# Patient Record
Sex: Female | Born: 1940 | Race: White | Hispanic: No | Marital: Married | Smoking: Former smoker
Health system: Southern US, Community
[De-identification: ages and names within clinical notes are randomized; demographics above are authoritative.]

## PROBLEM LIST (undated history)

## (undated) DIAGNOSIS — R7989 Other specified abnormal findings of blood chemistry: Principal | ICD-10-CM

## (undated) DIAGNOSIS — N289 Disorder of kidney and ureter, unspecified: Secondary | ICD-10-CM

## (undated) DIAGNOSIS — R600 Localized edema: Secondary | ICD-10-CM

## (undated) DIAGNOSIS — E785 Hyperlipidemia, unspecified: Secondary | ICD-10-CM

## (undated) DIAGNOSIS — R0981 Nasal congestion: Secondary | ICD-10-CM

## (undated) DIAGNOSIS — D61818 Other pancytopenia: Secondary | ICD-10-CM

## (undated) DIAGNOSIS — D649 Anemia, unspecified: Secondary | ICD-10-CM

## (undated) DIAGNOSIS — R011 Cardiac murmur, unspecified: Secondary | ICD-10-CM

## (undated) DIAGNOSIS — M25469 Effusion, unspecified knee: Secondary | ICD-10-CM

## (undated) DIAGNOSIS — M169 Osteoarthritis of hip, unspecified: Secondary | ICD-10-CM

## (undated) DIAGNOSIS — K819 Cholecystitis, unspecified: Principal | ICD-10-CM

## (undated) DIAGNOSIS — M1990 Unspecified osteoarthritis, unspecified site: Secondary | ICD-10-CM

## (undated) DIAGNOSIS — E039 Hypothyroidism, unspecified: Secondary | ICD-10-CM

## (undated) DIAGNOSIS — C91Z0 Other lymphoid leukemia not having achieved remission: Principal | ICD-10-CM

## (undated) DIAGNOSIS — Z22322 Carrier or suspected carrier of Methicillin resistant Staphylococcus aureus: Secondary | ICD-10-CM

## (undated) DIAGNOSIS — R634 Abnormal weight loss: Secondary | ICD-10-CM

## (undated) DIAGNOSIS — R05 Cough: Secondary | ICD-10-CM

## (undated) DIAGNOSIS — I8390 Asymptomatic varicose veins of unspecified lower extremity: Secondary | ICD-10-CM

## (undated) DIAGNOSIS — R51 Headache: Secondary | ICD-10-CM

## (undated) DIAGNOSIS — M5030 Other cervical disc degeneration, unspecified cervical region: Secondary | ICD-10-CM

## (undated) DIAGNOSIS — C55 Malignant neoplasm of uterus, part unspecified: Secondary | ICD-10-CM

## (undated) DIAGNOSIS — I00 Rheumatic fever without heart involvement: Secondary | ICD-10-CM

## (undated) DIAGNOSIS — M1710 Unilateral primary osteoarthritis, unspecified knee: Secondary | ICD-10-CM

## (undated) DIAGNOSIS — N179 Acute kidney failure, unspecified: Secondary | ICD-10-CM

## (undated) DIAGNOSIS — R1013 Epigastric pain: Principal | ICD-10-CM

## (undated) DIAGNOSIS — F419 Anxiety disorder, unspecified: Secondary | ICD-10-CM

## (undated) DIAGNOSIS — I10 Essential (primary) hypertension: Secondary | ICD-10-CM

## (undated) DIAGNOSIS — D72820 Lymphocytosis (symptomatic): Principal | ICD-10-CM

## (undated) DIAGNOSIS — R059 Cough, unspecified: Secondary | ICD-10-CM

## (undated) DIAGNOSIS — R110 Nausea: Secondary | ICD-10-CM

## (undated) DIAGNOSIS — R945 Abnormal results of liver function studies: Secondary | ICD-10-CM

## (undated) DIAGNOSIS — M179 Osteoarthritis of knee, unspecified: Secondary | ICD-10-CM

## (undated) HISTORY — DX: Hypothyroidism, unspecified: E03.9

## (undated) HISTORY — DX: Other pancytopenia: D61.818

## (undated) HISTORY — DX: Hyperlipidemia, unspecified: E78.5

## (undated) HISTORY — DX: Abnormal results of liver function studies: R94.5

## (undated) HISTORY — DX: Osteoarthritis of hip, unspecified: M16.9

## (undated) HISTORY — DX: Other specified abnormal findings of blood chemistry: R79.89

## (undated) HISTORY — DX: Anemia, unspecified: D64.9

## (undated) HISTORY — DX: Essential (primary) hypertension: I10

## (undated) HISTORY — DX: Effusion, unspecified knee: M25.469

## (undated) HISTORY — DX: Abnormal weight loss: R63.4

## (undated) HISTORY — DX: Unspecified osteoarthritis, unspecified site: M19.90

## (undated) HISTORY — PX: TOOTH EXTRACTION: SUR596

## (undated) HISTORY — DX: Asymptomatic varicose veins of unspecified lower extremity: I83.90

## (undated) HISTORY — DX: Osteoarthritis of knee, unspecified: M17.9

## (undated) HISTORY — DX: Nasal congestion: R09.81

## (undated) HISTORY — DX: Rheumatic fever without heart involvement: I00

## (undated) HISTORY — PX: GASTRIC ROUX-EN-Y: SHX5262

## (undated) HISTORY — PX: OTHER SURGICAL HISTORY: SHX169

## (undated) HISTORY — DX: Carrier or suspected carrier of methicillin resistant Staphylococcus aureus: Z22.322

## (undated) HISTORY — DX: Malignant neoplasm of uterus, part unspecified: C55

## (undated) HISTORY — DX: Cholecystitis, unspecified: K81.9

## (undated) HISTORY — DX: Morbid (severe) obesity due to excess calories: E66.01

## (undated) HISTORY — DX: Epigastric pain: R10.13

## (undated) HISTORY — DX: Unilateral primary osteoarthritis, unspecified knee: M17.10

## (undated) HISTORY — PX: NO PAST SURGERIES: SHX2092

## (undated) HISTORY — DX: Other lymphoid leukemia not having achieved remission: C91.Z0

## (undated) HISTORY — DX: Localized edema: R60.0

## (undated) HISTORY — PX: ULNAR NERVE REPAIR: SHX2594

## (undated) HISTORY — DX: Other cervical disc degeneration, unspecified cervical region: M50.30

## (undated) HISTORY — DX: Acute kidney failure, unspecified: N17.9

## (undated) HISTORY — DX: Nausea: R11.0

## (undated) HISTORY — DX: Disorder of kidney and ureter, unspecified: N28.9

## (undated) HISTORY — DX: Lymphocytosis (symptomatic): D72.820

## (undated) HISTORY — PX: JOINT REPLACEMENT: SHX530

## (undated) HISTORY — DX: Headache: R51

---

## 1964-05-24 HISTORY — PX: TONSILLECTOMY: SUR1361

## 1994-05-24 DIAGNOSIS — C55 Malignant neoplasm of uterus, part unspecified: Secondary | ICD-10-CM

## 1994-05-24 HISTORY — DX: Malignant neoplasm of uterus, part unspecified: C55

## 1994-05-24 HISTORY — PX: ABDOMINAL HYSTERECTOMY: SUR658

## 1995-05-25 HISTORY — PX: OTHER SURGICAL HISTORY: SHX169

## 2003-05-25 HISTORY — PX: OTHER SURGICAL HISTORY: SHX169

## 2004-05-24 HISTORY — PX: OTHER SURGICAL HISTORY: SHX169

## 2008-01-01 ENCOUNTER — Emergency Department: Payer: Self-pay | Admitting: Emergency Medicine

## 2008-05-02 ENCOUNTER — Ambulatory Visit: Payer: Self-pay | Admitting: Internal Medicine

## 2009-03-11 ENCOUNTER — Ambulatory Visit: Payer: Self-pay | Admitting: Unknown Physician Specialty

## 2009-05-05 ENCOUNTER — Ambulatory Visit: Payer: Self-pay | Admitting: Internal Medicine

## 2009-08-25 ENCOUNTER — Encounter: Admission: RE | Admit: 2009-08-25 | Discharge: 2009-08-25 | Payer: Self-pay | Admitting: *Deleted

## 2009-09-21 HISTORY — PX: OTHER SURGICAL HISTORY: SHX169

## 2009-10-01 ENCOUNTER — Ambulatory Visit: Payer: Self-pay | Admitting: Pulmonary Disease

## 2009-10-01 ENCOUNTER — Inpatient Hospital Stay (HOSPITAL_COMMUNITY): Admission: RE | Admit: 2009-10-01 | Discharge: 2009-10-05 | Payer: Self-pay | Admitting: *Deleted

## 2009-10-01 ENCOUNTER — Ambulatory Visit: Payer: Self-pay | Admitting: Cardiovascular Disease

## 2009-10-03 ENCOUNTER — Encounter: Payer: Self-pay | Admitting: Cardiovascular Disease

## 2009-10-03 ENCOUNTER — Encounter (INDEPENDENT_AMBULATORY_CARE_PROVIDER_SITE_OTHER): Payer: Self-pay | Admitting: Pulmonary Disease

## 2009-10-03 LAB — CONVERTED CEMR LAB
Creatinine, Ser: 26.1 mg/dL
Magnesium: 1.8 mg/dL
Potassium: 204 meq/L
Potassium: 3.2 meq/L

## 2009-10-17 ENCOUNTER — Ambulatory Visit: Payer: Self-pay | Admitting: Cardiovascular Disease

## 2009-10-17 DIAGNOSIS — R609 Edema, unspecified: Secondary | ICD-10-CM | POA: Insufficient documentation

## 2009-10-17 DIAGNOSIS — R0989 Other specified symptoms and signs involving the circulatory and respiratory systems: Secondary | ICD-10-CM | POA: Insufficient documentation

## 2009-10-20 DIAGNOSIS — I959 Hypotension, unspecified: Secondary | ICD-10-CM

## 2010-06-04 ENCOUNTER — Ambulatory Visit: Payer: Self-pay | Admitting: Internal Medicine

## 2010-06-09 ENCOUNTER — Ambulatory Visit: Payer: Self-pay | Admitting: Orthopedic Surgery

## 2010-06-23 NOTE — Assessment & Plan Note (Signed)
Summary: NP6/AMD   Primary Provider:  Letta Pate. Walker  CC:  Consult;episodes of tachycardia.  History of Present Illness: Stacey Cooley is a 70 year old woman with a history of cervical DJD, morbid obesity, hyperlipidemia, chronic lower extremity swelling, bilateral hip and knee DJD D. with replacement done in some of the joints, presenting for cardiac evaluation after a recent hospital admission for cervical decompression and fusion of multiple cervical vertebrae. In the postoperative period, she developed tachycardia, possibly consistent with SVT. She was given Cardizem which improved her rhythm appeared the second and third day postoperatively she also had short runs of a narrow complex rhythm that resolved without intervention.  She presents today for new patient evaluation and for evaluation of her tachycardia. Since her discharge, she has felt well. she denies any lightheadedness, dizziness, or operations. She was discharged on metoprolol tartrate 25 mg b.i.d. She was kept on her benazepril. Her lower extremity edema is essentially unchanged on her diuretic.   Echocardiogram05/13/2011  Left ventricle: The cavity size was normal. Wall thickness was     normal. Systolic function was normal. The estimated ejection fraction was in the range of 55% to 60%.   - Aortic valve: Valve area: 2.12cm^2(VTI).   - Pulmonary arteries: PA peak pressure: 54mm Hg (S).  Problems Prior to Update: None  Medications Prior to Update: 1)  Metoprolol Tartrate 50 Mg Tabs (Metoprolol Tartrate) .... Take 1 and A Half Tablet By Mouth Two Times A Day  Current Medications (verified): 1)  Metoprolol Tartrate 50 Mg Tabs (Metoprolol Tartrate) .... Take 1 and A Half Tablet By Mouth Two Times A Day  Allergies: 1)  ! Penicillin 2)  ! Sulfa 3)  ! Adhesive Tape  Past History:  Past Medical History: Last updated: 10/17/2009   1. Hypertension.   2. Morbid obesity.   3. Cervical degenerative disk disease.   4.  History of rheumatic fever as a child.   5. Dyslipidemia.   6. Recurrent lower extremity leg swelling noted by patient.   7. 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  Family History: Last updated: 10/17/2009 No significant history of coronary artery disease   noted.  No cancer history.   Social History: Last updated: 10/17/2009 No smoking, no alcohol, no drugs.  Lives with her   husband.   Review of Systems  The patient denies fever, weight loss, weight gain, vision loss, decreased hearing, hoarseness, chest pain, syncope, dyspnea on exertion, peripheral edema, prolonged cough, abdominal pain, incontinence, muscle weakness, depression, and enlarged lymph nodes.    Vital Signs:  Patient profile:   70 year old female Height:      62 inches Weight:      284 pounds BMI:     52.13 Pulse rate:   84 / minute Pulse rhythm:   regular BP sitting:   88 / 42  (right arm) Cuff size:   regular  Vitals Entered By: Stanton Kidney, EMT-P (Oct 17, 2009 2:19 PM)  Physical Exam  General:  Well developed, well nourished, in no acute distress. obese Head:  normocephalic and atraumatic Neck:  Neck supple, no JVD. No masses, thyromegaly or abnormal cervical nodes. Lungs:  Clear bilaterally to auscultation and percussion.  Heart:  Non-displaced PMI, chest non-tender; regular rate and rhythm, S1, S2 without murmurs, rubs or gallops. Carotid upstroke normal, no bruit. . Pedals normal pulses. 1 to 2+ edema of the LE to below the knees, no varicosities. Abdomen:  Bowel sounds positive; abdomen soft and non-tender without masses, organomegaly, or hernias noted. No hepatosplenomegaly.Obese Msk:  Back normal, normal gait. Muscle strength and tone  normal. Pulses:  pulses normal in all 4 extremities Extremities:  No clubbing or cyanosis. Neurologic:  Alert and oriented x 3. Skin:  Intact without lesions or rashes. Psych:  Normal affect.    EKG  Procedure date:  10/04/2009  Findings:      normal sinus rhythm with rate 73 beats per minute, no significant ST or T wave changes.  Impression & Recommendations:  Problem # 1:  TACHYCARDIA (ICD-785) tachycardia was noted in the perioperative and postoperative period. The details are unavailable to [place] as per consult note done in the hospital, the telemetry strips were not saved. Notes indicate a regular narrow complex rhythm concerning for SVT. She denies any significant symptoms since that time with no lightheadedness dizziness or shortness of breath.  We'll continue her on metoprolol tartrate 25 mg b.i.d. We will hold off on further monitors she has additional symptoms.  Problem # 2:  HYPOTENSION, UNSPECIFIED (ICD-458.9) her blood pressure is low today and on recheck it continues to be in the 90s systolic. We will hold her benazepril and have asked her to monitor her blood pressure at home.  Problem # 3:  EDEMA (ICD-782.3) etiology of her edema is likely due to some component of pulmonary hypertension and venous insufficiency. elevated right ventricular systolic pressures were noted on her echocardiogram.  We'll continue her on the Lasix and have suggested that she minimize her fluid intake. I recommended that she wear TED hose when her legs are down.  Problem # 4:  PREVENTIVE HEALTH CARE (ICD-V70.0) she is a nonsmoker, we have recommended that she start aspirin 81 mg daily. we will obtain a copy of her most recent cholesterol.  Patient Instructions: 1)  Your physician recommends that you schedule a follow-up appointment as needed Prescriptions: METOPROLOL TARTRATE 25 MG TABS (METOPROLOL TARTRATE) Take one tablet by mouth twice a day  #180 x 4   Entered by:   Hardin Negus,  RMA   Authorized by:   Dossie Arbour MD   Signed by:   Hardin Negus, RMA on 10/17/2009   Method used:   Electronically to        [facility]. [address] [phone]* (retail)       [address] [place], [place]  [id number]       Ph: [phone]       Fax: [phone]   RxID:   [id number]

## 2010-06-23 NOTE — Letter (Signed)
Summary: PHI  PHI   Imported By: Harlon Flor 10/21/2009 16:36:04  _____________________________________________________________________  External Attachment:    Type:   Image     Comment:   External Document

## 2010-08-10 LAB — CBC
HCT: 25.5 % — ABNORMAL LOW (ref 36.0–46.0)
MCV: 95.6 fL (ref 78.0–100.0)
Platelets: 228 10*3/uL (ref 150–400)

## 2010-08-11 LAB — CBC
HCT: 25.5 % — ABNORMAL LOW (ref 36.0–46.0)
HCT: 30 % — ABNORMAL LOW (ref 36.0–46.0)
Hemoglobin: 10.1 g/dL — ABNORMAL LOW (ref 12.0–15.0)
Hemoglobin: 8.2 g/dL — ABNORMAL LOW (ref 12.0–15.0)
Hemoglobin: 8.5 g/dL — ABNORMAL LOW (ref 12.0–15.0)
Hemoglobin: 9.1 g/dL — ABNORMAL LOW (ref 12.0–15.0)
MCHC: 33.7 g/dL (ref 30.0–36.0)
MCHC: 34 g/dL (ref 30.0–36.0)
MCV: 94.3 fL (ref 78.0–100.0)
MCV: 94.9 fL (ref 78.0–100.0)
MCV: 95.3 fL (ref 78.0–100.0)
Platelets: 190 10*3/uL (ref 150–400)
Platelets: 193 10*3/uL (ref 150–400)
Platelets: 204 10*3/uL (ref 150–400)
RBC: 2.65 MIL/uL — ABNORMAL LOW (ref 3.87–5.11)
RBC: 3.15 MIL/uL — ABNORMAL LOW (ref 3.87–5.11)
RDW: 15 % (ref 11.5–15.5)
RDW: 15.5 % (ref 11.5–15.5)
WBC: 7.5 10*3/uL (ref 4.0–10.5)
WBC: 7.8 10*3/uL (ref 4.0–10.5)
WBC: 8.8 10*3/uL (ref 4.0–10.5)
WBC: 8.8 10*3/uL (ref 4.0–10.5)

## 2010-08-11 LAB — BLOOD GAS, ARTERIAL
Bicarbonate: 21.9 mEq/L (ref 20.0–24.0)
O2 Saturation: 97.3 %
PEEP: 5 cmH2O
Patient temperature: 98.4
RATE: 16 resp/min

## 2010-08-11 LAB — URINALYSIS, ROUTINE W REFLEX MICROSCOPIC
Bilirubin Urine: NEGATIVE
Hgb urine dipstick: NEGATIVE
Protein, ur: NEGATIVE mg/dL
pH: 5 (ref 5.0–8.0)

## 2010-08-11 LAB — DIFFERENTIAL
Basophils Relative: 1 % (ref 0–1)
Eosinophils Absolute: 0.1 10*3/uL (ref 0.0–0.7)
Monocytes Absolute: 0.5 10*3/uL (ref 0.1–1.0)
Monocytes Relative: 6 % (ref 3–12)
Neutro Abs: 4.2 10*3/uL (ref 1.7–7.7)

## 2010-08-11 LAB — BASIC METABOLIC PANEL
BUN: 11 mg/dL (ref 6–23)
BUN: 21 mg/dL (ref 6–23)
BUN: 8 mg/dL (ref 6–23)
CO2: 24 mEq/L (ref 19–32)
Calcium: 8.4 mg/dL (ref 8.4–10.5)
Calcium: 8.5 mg/dL (ref 8.4–10.5)
Calcium: 8.6 mg/dL (ref 8.4–10.5)
Chloride: 108 mEq/L (ref 96–112)
Chloride: 113 mEq/L — ABNORMAL HIGH (ref 96–112)
Creatinine, Ser: 0.6 mg/dL (ref 0.4–1.2)
Creatinine, Ser: 0.67 mg/dL (ref 0.4–1.2)
GFR calc Af Amer: >60 mL/min (ref >60)
GFR calc non Af Amer: >60 mL/min (ref >60)
GFR calc non Af Amer: >60 mL/min (ref >60)
GFR calc non Af Amer: >60 mL/min (ref >60)
GFR calc non Af Amer: >60 mL/min (ref >60)
Glucose, Bld: 132 mg/dL — ABNORMAL HIGH (ref 70–99)
Glucose, Bld: 165 mg/dL — ABNORMAL HIGH (ref 70–99)
Potassium: 3.2 mEq/L — ABNORMAL LOW (ref 3.5–5.1)
Potassium: 4.1 mEq/L (ref 3.5–5.1)
Sodium: 140 mEq/L (ref 135–145)
Sodium: 141 mEq/L (ref 135–145)
Sodium: 144 mEq/L (ref 135–145)

## 2010-08-11 LAB — MRSA PCR SCREENING: MRSA by PCR: NEGATIVE

## 2010-08-11 LAB — TYPE AND SCREEN: ABO/RH(D): O NEG

## 2010-08-11 LAB — MAGNESIUM
Magnesium: 1.8 mg/dL (ref 1.5–2.5)
Magnesium: 1.9 mg/dL (ref 1.5–2.5)

## 2010-08-11 LAB — COMPREHENSIVE METABOLIC PANEL
AST: 32 U/L (ref 0–37)
BUN: 41 mg/dL — ABNORMAL HIGH (ref 6–23)
Chloride: 107 mEq/L (ref 96–112)
Creatinine, Ser: 1.22 mg/dL — ABNORMAL HIGH (ref 0.4–1.2)
GFR calc Af Amer: 53 mL/min — ABNORMAL LOW (ref >60)
Potassium: 4.1 mEq/L (ref 3.5–5.1)
Total Protein: 7.4 g/dL (ref 6.0–8.3)

## 2010-08-11 LAB — PHOSPHORUS
Phosphorus: 1.9 mg/dL — ABNORMAL LOW (ref 2.3–4.6)
Phosphorus: 2.6 mg/dL (ref 2.3–4.6)

## 2010-08-11 LAB — PREALBUMIN: Prealbumin: 13.1 mg/dL — ABNORMAL LOW (ref 18.0–45.0)

## 2010-08-11 LAB — ABO/RH: ABO/RH(D): O NEG

## 2010-08-11 LAB — PROTIME-INR: INR: 1.03 (ref 0.00–1.49)

## 2010-08-24 ENCOUNTER — Ambulatory Visit: Payer: Self-pay | Admitting: Gastroenterology

## 2010-10-15 ENCOUNTER — Inpatient Hospital Stay: Payer: Self-pay | Admitting: Emergency Medicine

## 2010-11-17 ENCOUNTER — Ambulatory Visit: Payer: Self-pay | Admitting: Gastroenterology

## 2010-11-18 ENCOUNTER — Ambulatory Visit: Payer: Self-pay | Admitting: Gastroenterology

## 2010-12-15 ENCOUNTER — Encounter: Payer: Self-pay | Admitting: Cardiovascular Disease

## 2011-06-28 ENCOUNTER — Ambulatory Visit: Payer: Self-pay | Admitting: Internal Medicine

## 2011-06-29 ENCOUNTER — Ambulatory Visit: Payer: Self-pay | Admitting: Internal Medicine

## 2011-09-02 ENCOUNTER — Telehealth: Payer: Self-pay | Admitting: Oncology

## 2011-09-02 NOTE — Telephone Encounter (Signed)
called pt lmovm to rtn call to schedule appt 

## 2011-09-03 ENCOUNTER — Telehealth: Payer: Self-pay | Admitting: Oncology

## 2011-09-03 NOTE — Telephone Encounter (Signed)
called pt scheduled appt for 04/15 @ 3pm.  faxed over a lettter to Dr. Dan Humphreys with appt d/t

## 2011-09-06 ENCOUNTER — Telehealth: Payer: Self-pay | Admitting: Oncology

## 2011-09-06 ENCOUNTER — Ambulatory Visit (HOSPITAL_BASED_OUTPATIENT_CLINIC_OR_DEPARTMENT_OTHER): Payer: PRIVATE HEALTH INSURANCE | Admitting: Lab

## 2011-09-06 ENCOUNTER — Encounter: Payer: Self-pay | Admitting: Oncology

## 2011-09-06 ENCOUNTER — Ambulatory Visit (HOSPITAL_BASED_OUTPATIENT_CLINIC_OR_DEPARTMENT_OTHER): Payer: Medicare Other | Admitting: Oncology

## 2011-09-06 ENCOUNTER — Ambulatory Visit: Payer: Medicare Other

## 2011-09-06 ENCOUNTER — Ambulatory Visit: Payer: Self-pay

## 2011-09-06 VITALS — BP 116/64 | HR 63 | Temp 97.0°F | Ht 62.0 in | Wt 276.2 lb

## 2011-09-06 DIAGNOSIS — E538 Deficiency of other specified B group vitamins: Secondary | ICD-10-CM

## 2011-09-06 DIAGNOSIS — D649 Anemia, unspecified: Secondary | ICD-10-CM

## 2011-09-06 DIAGNOSIS — N289 Disorder of kidney and ureter, unspecified: Secondary | ICD-10-CM

## 2011-09-06 HISTORY — DX: Disorder of kidney and ureter, unspecified: N28.9

## 2011-09-06 LAB — COMPREHENSIVE METABOLIC PANEL
CO2: 24 mEq/L (ref 19–32)
Creatinine, Ser: 1.25 mg/dL — ABNORMAL HIGH (ref 0.50–1.10)
Glucose, Bld: 101 mg/dL — ABNORMAL HIGH (ref 70–99)
Total Bilirubin: 0.2 mg/dL — ABNORMAL LOW (ref 0.3–1.2)

## 2011-09-06 LAB — CBC & DIFF AND RETIC
BASO%: 0.6 % (ref 0.0–2.0)
EOS%: 2.7 % (ref 0.0–7.0)
Immature Retic Fract: 4.7 % (ref 1.60–10.00)
MCH: 30.3 pg (ref 25.1–34.0)
MCHC: 31.8 g/dL (ref 31.5–36.0)
MCV: 95.3 fL (ref 79.5–101.0)
MONO%: 5.6 % (ref 0.0–14.0)
RDW: 15.6 % — ABNORMAL HIGH (ref 11.2–14.5)
Retic Ct Abs: 52.16 10*3/uL (ref 33.70–90.70)
lymph#: 3.8 10*3/uL — ABNORMAL HIGH (ref 0.9–3.3)

## 2011-09-06 LAB — LACTATE DEHYDROGENASE: LDH: 182 U/L (ref 94–250)

## 2011-09-06 LAB — MORPHOLOGY: RBC Comments: NORMAL

## 2011-09-06 LAB — CHCC SMEAR

## 2011-09-06 NOTE — Progress Notes (Signed)
Stacey Cooley  Telephone:([phone]) [phone] Fax:([phone]) [phone]     INITIAL HEMATOLOGY CONSULTATION    Referral MD:  Dr. Jonny Ruiz B. Dan Humphreys, M.D.  [facility] Cc:  Dr. Jones Broom, M.D.  Reason for Referral: anemia.     HPI: [name] is a 71 year-old [place] woman with HTN, obesity, s/p gastric bypass, hypothyroid, renal insufficiency.  I had the chance to review her extensive medical record.  She needs right ulnar nerve release.  She was evaluated by Dr. Ave Filter who found that she has anemia.  PCP work up showed negative hem occult, normal TSH, normal ferritin and iron levels.  She was thus kindly referred for evaluation.  Mr. Burkholder has had anemia ever since she could remember.  She said that her Hgb was never got down to 9.  She received autologous pRBC transfusion in 1977 and 2011 when she had her hip surgery and neck surgery; respectively.  In the past, she had used liquid iron with slight improvement of her Hgb.  She had GI work up with EGD about 10 years ago which was negative.  She has colonoscopy in 08/2010 and 10/2010 for concern for ischemia bowel.  Last colonoscopy in 10/2010 showed possible inflammatory change; however, biopsy was negative for cancer, inflammatory bowel diease per patient's report.   She presented to the clinic for the first time today with her husband.  She denied any visible source of bleeding.  She is actively trying to lose weight by eating less and more active.  She has mild fatigue; however, she is independent of all activities of daily living.  She has diffuse arthritic pain in her shoulders, wrists, hips, knees, ankles.  She had history of release of her right ulnar nerve in the past; however, it is causing her issues again with numbness in the 4th and 5th right fingers.   Patient denies headache, visual changes, confusion, drenching night sweats, palpable lymph node swelling, mucositis, odynophagia, dysphagia, nausea  vomiting, jaundice, chest pain, palpitation, shortness of breath, dyspnea on exertion, productive cough, gum bleeding, epistaxis, hematemesis, hemoptysis, abdominal pain, abdominal swelling, early satiety, melena, hematochezia, hematuria, skin rash, spontaneous bleeding, heat or cold intolerance, bowel bladder incontinence,  focal motor weakness, depression, suicidal or homocidal ideation, feeling hopelessness.     Past Medical History  Diagnosis Date  . HTN (hypertension)   . Morbid obesity   . DDD (degenerative disc disease), cervical   . Rheumatic fever     child  . Dyslipidemia   . Swelling of joint of lower leg     recurrent lower extremity leg swelling, noted by pt  . 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 Anemia   . Hypothyroid   . Renal insufficiency 09/06/2011  . Varicose vein of leg   . Uterine cancer 1996    s/p hysterectomy; patient reported low stage; did not need adjuvant chemo  or radiation.   :    Past Surgical History  Procedure Date  . Anterior cervical decompression and fusion 09/2009    involving C4-7 levels as well as a posterior cervical fusion prcedure from C3 to T  . Tonsillectomy 1966  . Gastric stapling 12/1984 and 2001  . Abdominal hysterectomy 1996  . Right hip replacement 1997  . Right ulnar nerve decompression 2005  . Right carpal tunnel release  2005  . Total left knee replacement 2006  :   CURRENT MEDS: Current Outpatient Prescriptions  Medication Sig Dispense Refill  . acetaminophen-codeine (TYLENOL #3) 300-30 MG per tablet Take by mouth. Take one tablet bid      . allopurinol (ZYLOPRIM) 100 MG tablet Take 100 mg by mouth 2 (two) times daily.        . benazepril (LOTENSIN) 20 MG tablet Take 20 mg by mouth daily.        . CELEBREX 100 MG capsule Take 100 mg by mouth Daily.      . diazepam (VALIUM) 5 MG tablet Take 5 mg  by mouth every 6 (six) hours as needed.        . ferrous fumarate (FERRO-SEQUELS) 50 MG CR tablet Take 50 mg by mouth daily.        . furosemide (LASIX) 40 MG tablet Take 40 mg by mouth 2 (two) times daily.        Marland Kitchen HYDROcodone-acetaminophen (VICODIN) 5-500 MG per tablet Take 1 tablet by mouth every 6 (six) hours as needed.        Marland Kitchen levothyroxine (SYNTHROID, LEVOTHROID) 75 MCG tablet Take 75 mcg by mouth daily.        . metoprolol tartrate (LOPRESSOR) 25 MG tablet Take 25 mg by mouth 2 (two) times daily.        Marland Kitchen nystatin-triamcinolone (MYCOLOG II) cream As needed      . oxybutynin (DITROPAN) 5 MG tablet Take 5 mg by mouth Daily.      . Potassium Gluconate 550 MG TABS Take 1 tablet by mouth daily.        Marland Kitchen tiZANidine (ZANAFLEX) 2 MG tablet Take 2 mg by mouth Twice daily.          Allergies  Allergen Reactions  . Latex Itching  . Penicillins   . Sulfonamide Derivatives   :  Family History  Problem Relation Age of Onset  . Coronary artery disease Neg Hx   . Cancer Neg Hx   :  History   Social History  . Marital Status: Married    Spouse Name: N/A    Number of Children: N/A  . Years of Education: N/A   Occupational History  . Not on file.   Social History Main Topics  . Smoking status: Unknown If Ever Smoked  . Smokeless tobacco: Not on file   Comment: no smoking  . Alcohol Use: No  . Drug Use: No  . Sexually Active: Not on file   Other Topics Concern  . Not on file   Social History Narrative   Lives with husband.   :  REVIEW OF SYSTEM:  The rest of the 14-point review of sytem was negative.   Exam:  General:  Obese woman in no acute distress.  Eyes:  no scleral icterus.  ENT:  There were no oropharyngeal lesions.  Neck was without thyromegaly.  Lymphatics:  Negative cervical, supraclavicular or axillary adenopathy.  Respiratory: lungs were clear bilaterally without wheezing or crackles.  Cardiovascular:  Regular rate and rhythm, S1/S2, without rub or gallop.   There was a 3/6 systolic murmur best heard in the left upper sternal border without radiation. There was no pedal edema.  GI:  abdomen was soft, flat, nontender, nondistended, without organomegaly.  Muscoloskeletal:  no spinal tenderness of palpation of vertebral spine.  Skin exam was without echymosis, petichae.  Neuro exam was nonfocal.  Patient was able to get on and off exam table  without assistance.  Gait was normal.  Patient was alerted and oriented.  Attention was good.   Language was appropriate.  Mood was normal without depression.  Speech was not pressured.  Thought content was not tangential.    LABS:  Lab Results  Component Value Date   WBC 5.8 10/05/2009   HGB 8.5* 10/05/2009   HCT 25.5* 10/05/2009   PLT 228 10/05/2009   GLUCOSE 101* 09/06/2011   ALT 22 09/06/2011   AST 28 09/06/2011   NA 139 09/06/2011   K 4.0 09/06/2011   CL 105 09/06/2011   CREATININE 1.25* 09/06/2011   BUN 28* 09/06/2011   CO2 24 09/06/2011   INR 1.03 09/29/2009    Blood smear review:   I personally reviewed the patient's peripheral blood smear today.  There was isocytosis.  There was no peripheral blast.  There was no schistocytosis, spherocytosis, target cell, rouleaux formation, tear drop cell.  There was no giant platelets or platelet clumps.      ASSESSMENT AND PLAN:   1.  HTN:  Well controlled on Benazepril and Metoprolol per PCP.   2.  Hypothyroidism:  On Synthroid per PCP.  Last TSH was within normal range.   3.  Gout:  Well controlled on allopurinol, Celebrex per PCP.   4.  Obesity:  S/p gastric bypass in the past.   5.  Renal insufficiency: slight increase of her Cr now compared to past CMET.   I sent for 24 hour urine collection for creatinine clearance and total protein.   6.  Right ulnar release:  Pending anemia work up.   7.  Chronic anemia:  - Impression:  Life long however not as low Hgb as she is now.  Ddx:Thalassemia vs Vit B12 deficiency (from past gastric procedure); chronic kidney  disease.  We need to rule out myeloma (due to worsening renal insufficiency).  MDS or pure red cell aplasia are on the differential due to worsen Hgb from baseline anemia.   - Work up:  I sent for 24 hour urine collection for creatinine clearance.  I sent for SPEP, UPEP, serum light chain to rule out plasma cell dyscrasia process.  I sent for hemoglobin electrophoresis to rule out thalassemia.   I sent for Vit B12 level.  I sent for LDH, haptoglobin, retic to rule out hemolysis but less likely.  I sent out for erythropoetin level to see if there is a component of renal insufficiency or relative dyserythropoesis.  If all these are negative and her Hgb continues to worsen, we may consider diagnostic bone marrow biopsy to rule out MDS or red cell aplasia.   8.  Follow up (assuming negative work up).  [facility] - Follow up with me in about 6 months.    The length of time of the face-to-face encounter was 60 minutes. More than 50% of time was spent counseling and coordination of care.

## 2011-09-06 NOTE — Progress Notes (Signed)
Patient came in today as a new patient with her husband,she said she thought she was suppose to see Dr.Khan,thats is what she was told by her primary care doctor,but i let her talk with Amy Mitchell,and she said that's was fine she would go ahead and see Dr.Ha since she is here.i did explain to her and her husband our financial assistance program,but they said no thanks.

## 2011-09-06 NOTE — Patient Instructions (Addendum)
1.  Diagnosis:  ANEMIA. - Possibility:  Thalassemia (hereditary anemia due to congenital hemoglobin abnormality);  Vit B12 deficiency (from past gastric procedure); chronic kidney disease.  We need to rule out myeloma (a slow growing bone cancer).  If anemia worsens, we may consider diagnostic bone marrow biopsy to rule out other bone marrow pathologies (such as myelodysplastic syndrome MDS).   2.  Follow up: - CBC in 2 and then 4 months. - Follow up with me in about 6 months.

## 2011-09-06 NOTE — Telephone Encounter (Signed)
Referred by Dr. Dan Humphreys Dx- Anemia

## 2011-09-08 ENCOUNTER — Telehealth: Payer: Self-pay | Admitting: Oncology

## 2011-09-08 ENCOUNTER — Ambulatory Visit: Payer: Medicare Other | Admitting: Lab

## 2011-09-08 LAB — HEMOGLOBINOPATHY EVALUATION
Hemoglobin Other: 0 %
Hgb A2 Quant: 3.3 % — ABNORMAL HIGH (ref 2.2–3.2)
Hgb F Quant: 0 % (ref 0.0–2.0)
Hgb S Quant: 0 %

## 2011-09-08 LAB — KAPPA/LAMBDA LIGHT CHAINS
Kappa free light chain: 3.66 mg/dL — ABNORMAL HIGH (ref 0.33–1.94)
Kappa:Lambda Ratio: 1.35 (ref 0.26–1.65)
Lambda Free Lght Chn: 2.71 mg/dL — ABNORMAL HIGH (ref 0.57–2.63)

## 2011-09-08 LAB — PROTEIN ELECTROPHORESIS, SERUM
Alpha-1-Globulin: 5.6 % — ABNORMAL HIGH (ref 2.9–4.9)
Alpha-2-Globulin: 14.5 % — ABNORMAL HIGH (ref 7.1–11.8)
Total Protein, Serum Electrophoresis: 7 g/dL (ref 6.0–8.3)

## 2011-09-08 LAB — VITAMIN B12: Vitamin B-12: 431 pg/mL (ref 211–911)

## 2011-09-08 NOTE — Telephone Encounter (Signed)
Pt came back to day, after getting appt calendar. She wants appt change from 7/1, pt's choice, to May , original order was 11/06/11. I emailed Dr. Gaylyn Rong and will call pt once he send instructions and order on what to do.

## 2011-09-09 ENCOUNTER — Other Ambulatory Visit: Payer: Self-pay | Admitting: Oncology

## 2011-09-10 ENCOUNTER — Other Ambulatory Visit: Payer: Self-pay | Admitting: Oncology

## 2011-09-10 DIAGNOSIS — D649 Anemia, unspecified: Secondary | ICD-10-CM

## 2011-09-10 DIAGNOSIS — N289 Disorder of kidney and ureter, unspecified: Secondary | ICD-10-CM

## 2011-09-10 LAB — UIFE/LIGHT CHAINS/TP QN, 24-HR UR
Alpha 1, Urine: DETECTED — AB
Alpha 2, Urine: DETECTED — AB
Beta, Urine: DETECTED — AB
Free Kappa Lt Chains,Ur: 18.3 mg/dL — ABNORMAL HIGH (ref 0.14–2.42)
Free Kappa/Lambda Ratio: 25.07 ratio — ABNORMAL HIGH (ref 2.04–10.37)
Free Lt Chn Excr Rate: 553.58 mg/d
Total Protein, Urine-Ur/day: 590 mg/d — ABNORMAL HIGH (ref 10–140)

## 2011-09-10 LAB — CREATININE CLEARANCE, URINE, 24 HOUR
Collection Interval-CRCL: 24 hours
Creatinine, Urine: 43 mg/dL
Urine Total Volume-CRCL: 3025 mL

## 2011-09-15 ENCOUNTER — Telehealth: Payer: Self-pay | Admitting: Oncology

## 2011-09-15 NOTE — Telephone Encounter (Signed)
called pt and informed her that we cancelled her lab on 07/01 and added lab for 05/15

## 2011-09-22 ENCOUNTER — Other Ambulatory Visit: Payer: PRIVATE HEALTH INSURANCE | Admitting: Lab

## 2011-09-27 ENCOUNTER — Ambulatory Visit: Payer: Self-pay | Admitting: Internal Medicine

## 2011-10-06 ENCOUNTER — Other Ambulatory Visit (HOSPITAL_BASED_OUTPATIENT_CLINIC_OR_DEPARTMENT_OTHER): Payer: Medicare Other | Admitting: Lab

## 2011-10-06 ENCOUNTER — Telehealth: Payer: Self-pay | Admitting: *Deleted

## 2011-10-06 DIAGNOSIS — D649 Anemia, unspecified: Secondary | ICD-10-CM

## 2011-10-06 DIAGNOSIS — N289 Disorder of kidney and ureter, unspecified: Secondary | ICD-10-CM

## 2011-10-06 LAB — CBC WITH DIFFERENTIAL/PLATELET
BASO%: 1 % (ref 0.0–2.0)
Eosinophils Absolute: 0.2 10*3/uL (ref 0.0–0.5)
LYMPH%: 42.5 % (ref 14.0–49.7)
MCHC: 31.8 g/dL (ref 31.5–36.0)
MONO#: 0.4 10*3/uL (ref 0.1–0.9)
NEUT#: 3.5 10*3/uL (ref 1.5–6.5)
Platelets: 215 10*3/uL (ref 145–400)
RBC: 2.62 10*6/uL — ABNORMAL LOW (ref 3.70–5.45)
RDW: 17.2 % — ABNORMAL HIGH (ref 11.2–14.5)
WBC: 7.3 10*3/uL (ref 3.9–10.3)
lymph#: 3.1 10*3/uL (ref 0.9–3.3)
nRBC: 0 % (ref 0–0)

## 2011-10-06 NOTE — Telephone Encounter (Signed)
Pt called back for her lab results.  Informed her of CBC results, Hgb still low per Dr. Gaylyn Rong.  She says already had surgery on her elbow, does not need any further surgery at this time.  Informed her Dr. Gaylyn Rong says her anemia is likely from chronic kidney disease.  Instructed pt to call us if worsening fatigue,  Any sob.  Go to ED for any chest pains.  Pt reports feels tired and cold, but denies any other symptoms. She verbalized understanding and will call for worsening symptoms otherwise keep appt as scheduled in August.

## 2011-10-06 NOTE — Telephone Encounter (Signed)
Message copied by Wende Mott on Wed Oct 06, 2011  2:38 PM ------      Message from: HA, Raliegh Ip T      Created: Wed Oct 06, 2011  1:10 PM       Please call pt with result of CBC.  Her hgb is still low (due to most likely chronic kidney disease).  Extensive work up when she was here last did not show other causes of anemia.  Will continue observation.  If her Hgb is too low for surgery on her hands; the surgeon can ask [place] to transfuse.              Thanks.

## 2011-10-06 NOTE — Telephone Encounter (Signed)
Left VM for pt to call back regarding her lab results.

## 2011-11-22 ENCOUNTER — Other Ambulatory Visit: Payer: PRIVATE HEALTH INSURANCE | Admitting: Lab

## 2012-01-06 ENCOUNTER — Other Ambulatory Visit (HOSPITAL_BASED_OUTPATIENT_CLINIC_OR_DEPARTMENT_OTHER): Payer: Medicare Other | Admitting: Lab

## 2012-01-06 DIAGNOSIS — E538 Deficiency of other specified B group vitamins: Secondary | ICD-10-CM

## 2012-01-06 DIAGNOSIS — D649 Anemia, unspecified: Secondary | ICD-10-CM

## 2012-01-06 DIAGNOSIS — N289 Disorder of kidney and ureter, unspecified: Secondary | ICD-10-CM

## 2012-01-06 LAB — CBC WITH DIFFERENTIAL/PLATELET
BASO%: 0.6 % (ref 0.0–2.0)
Basophils Absolute: 0 10*3/uL (ref 0.0–0.1)
EOS%: 2 % (ref 0.0–7.0)
HCT: 31.3 % — ABNORMAL LOW (ref 34.8–46.6)
HGB: 10 g/dL — ABNORMAL LOW (ref 11.6–15.9)
MCH: 30.7 pg (ref 25.1–34.0)
MONO#: 0.3 10*3/uL (ref 0.1–0.9)
RDW: 16.1 % — ABNORMAL HIGH (ref 11.2–14.5)
WBC: 4.4 10*3/uL (ref 3.9–10.3)
lymph#: 2.6 10*3/uL (ref 0.9–3.3)

## 2012-01-07 ENCOUNTER — Telehealth: Payer: Self-pay

## 2012-01-07 NOTE — Telephone Encounter (Signed)
Message copied by Kallie Locks on Fri Jan 07, 2012  4:28 PM ------      Message from: Jethro Bolus T      Created: Fri Jan 07, 2012  8:31 AM       Please call pt.  Her Hgb has improved (anemia from chronic kidney disease).  I again recommend watchful observation.  Controlling her BP to ensure no worsening of her chronic renal insufficiency.  Thanks.

## 2012-02-21 ENCOUNTER — Telehealth: Payer: Self-pay | Admitting: Oncology

## 2012-02-21 NOTE — Telephone Encounter (Signed)
pt called in and req to r/s october appt to december as [place] with her [place] etc

## 2012-03-07 ENCOUNTER — Ambulatory Visit: Payer: PRIVATE HEALTH INSURANCE | Admitting: Oncology

## 2012-03-07 ENCOUNTER — Other Ambulatory Visit: Payer: PRIVATE HEALTH INSURANCE

## 2012-04-26 ENCOUNTER — Telehealth: Payer: Self-pay | Admitting: *Deleted

## 2012-04-26 ENCOUNTER — Ambulatory Visit (HOSPITAL_BASED_OUTPATIENT_CLINIC_OR_DEPARTMENT_OTHER): Payer: Medicare Other | Admitting: Oncology

## 2012-04-26 ENCOUNTER — Encounter: Payer: Self-pay | Admitting: Oncology

## 2012-04-26 ENCOUNTER — Other Ambulatory Visit (HOSPITAL_BASED_OUTPATIENT_CLINIC_OR_DEPARTMENT_OTHER): Payer: Medicare Other | Admitting: Lab

## 2012-04-26 ENCOUNTER — Telehealth: Payer: Self-pay | Admitting: Oncology

## 2012-04-26 VITALS — BP 113/64 | HR 65 | Temp 97.6°F | Resp 20 | Ht 62.0 in | Wt 269.7 lb

## 2012-04-26 DIAGNOSIS — D631 Anemia in chronic kidney disease: Secondary | ICD-10-CM

## 2012-04-26 DIAGNOSIS — E538 Deficiency of other specified B group vitamins: Secondary | ICD-10-CM

## 2012-04-26 DIAGNOSIS — D649 Anemia, unspecified: Secondary | ICD-10-CM

## 2012-04-26 DIAGNOSIS — N189 Chronic kidney disease, unspecified: Secondary | ICD-10-CM

## 2012-04-26 DIAGNOSIS — N289 Disorder of kidney and ureter, unspecified: Secondary | ICD-10-CM

## 2012-04-26 LAB — CBC WITH DIFFERENTIAL/PLATELET
Basophils Absolute: 0 10*3/uL (ref 0.0–0.1)
Eosinophils Absolute: 0.1 10*3/uL (ref 0.0–0.5)
HCT: 33.9 % — ABNORMAL LOW (ref 34.8–46.6)
HGB: 11.1 g/dL — ABNORMAL LOW (ref 11.6–15.9)
MCV: 98 fL (ref 79.5–101.0)
NEUT#: 2.8 10*3/uL (ref 1.5–6.5)
NEUT%: 46.1 % (ref 38.4–76.8)
RDW: 15.3 % — ABNORMAL HIGH (ref 11.2–14.5)
lymph#: 2.7 10*3/uL (ref 0.9–3.3)

## 2012-04-26 LAB — COMPREHENSIVE METABOLIC PANEL (CC13)
Albumin: 3.8 g/dL (ref 3.5–5.0)
BUN: 16 mg/dL (ref 7.0–26.0)
CO2: 26 mEq/L (ref 22–29)
Calcium: 10.2 mg/dL (ref 8.4–10.4)
Glucose: 82 mg/dl (ref 70–99)
Potassium: 5.3 mEq/L — ABNORMAL HIGH (ref 3.5–5.1)
Sodium: 139 mEq/L (ref 136–145)
Total Protein: 6.8 g/dL (ref 6.4–8.3)

## 2012-04-26 NOTE — Telephone Encounter (Signed)
gv pt appt schedule for March and May 2014.

## 2012-04-26 NOTE — Telephone Encounter (Signed)
Left VM for pt to return nurse's call.  

## 2012-04-26 NOTE — Telephone Encounter (Signed)
Message copied by Wende Mott on Wed Apr 26, 2012 11:57 AM ------      Message from: HA, Raliegh Ip T      Created: Wed Apr 26, 2012 10:35 AM       Please call pt.  Her Hgb is improved from before.  This is most likely anemia of chronic kidney dz.  Continue observation.  Thanks.

## 2012-04-26 NOTE — Telephone Encounter (Signed)
Pt returned call and I informed her of lab results,  Elevated Potassium w/ instructions to stop taking her oral potassium.  Pt states has been taking Potassium twice daily per Dr. Dan Humphreys.  Instructed her to stop and contact Dr. Dan Humphreys for further instructions.  Routed lab to Dr. Dan Humphreys electronically.  Pt verbalized understanding.

## 2012-04-26 NOTE — Progress Notes (Signed)
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 female returns for routine follow-up with her husband. States he is having more fatigue, but has been taking care of her mother with Alzheimer's dementia who lives in [place]. She has diffuse arthritic pain in her shoulders, wrists, hips, knees, ankles. She had history of release of her right ulnar nerve in the past; however, it is causing her issues again with numbness in the 4th and 5th right fingers.   Patient denies headache, visual changes, confusion, drenching night sweats, palpable lymph node swelling, mucositis, odynophagia, dysphagia, nausea vomiting, jaundice, chest pain, palpitation, shortness of breath, dyspnea on exertion, productive cough, gum bleeding, epistaxis, hematemesis, hemoptysis, abdominal pain, abdominal swelling, early satiety, melena, hematochezia, hematuria, skin rash, spontaneous bleeding, heat or cold intolerance, bowel bladder incontinence, focal motor weakness, depression, suicidal or homocidal ideation, feeling hopelessness.   Past Medical History  Diagnosis Date  . HTN (hypertension)   . Morbid obesity   . DDD (degenerative disc disease), cervical   . Rheumatic fever     child  . Dyslipidemia   . Swelling of joint of lower leg     recurrent lower extremity leg swelling, noted by pt  . 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 Anemia   . Hypothyroid   . Renal insufficiency 09/06/2011  . Varicose vein of leg   . Uterine cancer 1996    s/p hysterectomy; patient reported low stage; did not need adjuvant chemo  or radiation.     Past Surgical History  Procedure Date  . Anterior  cervical decompression and fusion 09/2009    involving C4-7 levels as well as a posterior cervical fusion prcedure from C3 to T  . Tonsillectomy 1966  . Gastric stapling 12/1984 and 2001  . Abdominal hysterectomy 1996  . Right hip replacement 1997  . Right ulnar nerve decompression 2005  . Right carpal tunnel release 2005  . Total left knee replacement 2006    Current Outpatient Prescriptions  Medication Sig Dispense Refill  . aspirin 81 MG tablet Take 81 mg by mouth daily.      . citalopram (CELEXA) 20 MG tablet Take 20 mg by mouth daily.      . clindamycin (CLEOCIN) 150 MG capsule Take 150 mg by mouth. Prior to dental procedures.      . fluticasone (VERAMYST) 27.5 MCG/SPRAY nasal spray Place 2 sprays into the nose daily.      Marland Kitchen acetaminophen-codeine (TYLENOL #3) 300-30 MG per tablet Take by mouth. Take one tablet bid      . allopurinol (ZYLOPRIM) 100 MG tablet Take 300 mg by mouth daily.       . benazepril (LOTENSIN) 20 MG tablet Take 20 mg by mouth daily.        . CELEBREX 100 MG capsule Take 100 mg by mouth Daily.      . ferrous fumarate (FERRO-SEQUELS) 50 MG CR tablet Take 50 mg by mouth daily.        Marland Kitchen levothyroxine (SYNTHROID, LEVOTHROID) 75 MCG tablet Take 75 mcg by mouth daily.        . metoprolol tartrate (LOPRESSOR) 25 MG tablet Take 25  mg by mouth 2 (two) times daily.        Marland Kitchen oxybutynin (DITROPAN) 5 MG tablet Take 5 mg by mouth Daily.      . Potassium Gluconate 550 MG TABS Take 1 tablet by mouth daily.        Marland Kitchen tiZANidine (ZANAFLEX) 2 MG tablet Take 2 mg by mouth 3 (three) times daily.         ALLERGIES:  is allergic to latex; penicillins; and sulfonamide derivatives.  REVIEW OF SYSTEMS:  The rest of the 14-point review of system was negative.   Filed Vitals:   04/26/12 1059  BP: 113/64  Pulse: 65  Temp: 97.6 F (36.4 C)  Resp: 20   Wt Readings from Last 3 Encounters:  04/26/12 269 lb 11.2 oz (122.335 kg)  09/06/11 276 lb 3.2 oz (125.283 kg)  10/17/09 284 lb  (128.822 kg)   ECOG Performance status: 1  PHYSICAL EXAMINATION:   General: Obese woman in no acute distress. Eyes: no scleral icterus. ENT: There were no oropharyngeal lesions. Neck was without thyromegaly. Lymphatics: Negative cervical, supraclavicular or axillary adenopathy. Respiratory: lungs were clear bilaterally without wheezing or crackles. Cardiovascular: Regular rate and rhythm, S1/S2, without rub or gallop. There was a 3/6 systolic murmur best heard in the left upper sternal border without radiation. There was no pedal edema. GI: abdomen was soft, flat, nontender, nondistended, without organomegaly. Muscoloskeletal: no spinal tenderness of palpation of vertebral spine. Skin exam was without echymosis, petichae. Neuro exam was nonfocal. Patient was able to get on and off exam table without assistance. Gait was normal. Patient was alerted and oriented. Attention was good. Language was appropriate. Mood was normal without depression. Speech was not pressured. Thought content was not tangential.    LABORATORY/RADIOLOGY DATA:  Lab Results  Component Value Date   WBC 6.0 04/26/2012   HGB 11.1* 04/26/2012   HCT 33.9* 04/26/2012   PLT 215 04/26/2012   GLUCOSE 82 04/26/2012   ALKPHOS 121 04/26/2012   ALT 30 04/26/2012   AST 39* 04/26/2012   NA 139 04/26/2012   K 5.3* 04/26/2012   CL 102 04/26/2012   CREATININE 1.0 04/26/2012   BUN 16.0 04/26/2012   CO2 26 04/26/2012   INR 1.03 09/29/2009    ASSESSMENT AND PLAN:   1. HTN: Well controlled on Benazepril and Metoprolol per PCP.   2. Hypothyroidism: On Synthroid per PCP. Last TSH was within normal range.   3. Gout: Well controlled on allopurinol, Celebrex per PCP.   4. Obesity: S/p gastric bypass in the past.   5. Renal insufficiency: Creatinine has now normalized off Lasix.  6. Chronic anemia: Due to renal insufficiency. Anemia is improving. Recommend continued observation. In the future, is her Hgb continues to worsen, we may consider  diagnostic bone marrow biopsy to rule out MDS or red cell aplasia.   7. Follow up: CBC here at the [facility] in 3 months. Visit in 6 months.    The length of time of the face-to-face encounter was 15 minutes. More than 50% of time was spent counseling and coordination of care.

## 2012-04-30 ENCOUNTER — Emergency Department: Payer: Self-pay | Admitting: Emergency Medicine

## 2012-04-30 ENCOUNTER — Inpatient Hospital Stay (HOSPITAL_COMMUNITY)
Admission: EM | Admit: 2012-04-30 | Discharge: 2012-05-08 | DRG: 490 | Disposition: A | Discharge status: Skilled Nursing Facility | Payer: Medicare Other | Attending: Orthopedic Surgery | Admitting: Orthopedic Surgery

## 2012-04-30 ENCOUNTER — Encounter (HOSPITAL_COMMUNITY): Payer: Self-pay | Admitting: Emergency Medicine

## 2012-04-30 DIAGNOSIS — E039 Hypothyroidism, unspecified: Secondary | ICD-10-CM | POA: Diagnosis present

## 2012-04-30 DIAGNOSIS — R110 Nausea: Secondary | ICD-10-CM | POA: Diagnosis present

## 2012-04-30 DIAGNOSIS — Z8614 Personal history of Methicillin resistant Staphylococcus aureus infection: Secondary | ICD-10-CM

## 2012-04-30 DIAGNOSIS — S129XXA Fracture of neck, unspecified, initial encounter: Secondary | ICD-10-CM

## 2012-04-30 DIAGNOSIS — F329 Major depressive disorder, single episode, unspecified: Secondary | ICD-10-CM | POA: Diagnosis present

## 2012-04-30 DIAGNOSIS — S12100A Unspecified displaced fracture of second cervical vertebra, initial encounter for closed fracture: Secondary | ICD-10-CM | POA: Diagnosis present

## 2012-04-30 DIAGNOSIS — R609 Edema, unspecified: Secondary | ICD-10-CM

## 2012-04-30 DIAGNOSIS — Z79899 Other long term (current) drug therapy: Secondary | ICD-10-CM

## 2012-04-30 DIAGNOSIS — F3289 Other specified depressive episodes: Secondary | ICD-10-CM | POA: Diagnosis present

## 2012-04-30 DIAGNOSIS — N179 Acute kidney failure, unspecified: Secondary | ICD-10-CM | POA: Diagnosis present

## 2012-04-30 DIAGNOSIS — S12000A Unspecified displaced fracture of first cervical vertebra, initial encounter for closed fracture: Principal | ICD-10-CM | POA: Diagnosis present

## 2012-04-30 DIAGNOSIS — Z96659 Presence of unspecified artificial knee joint: Secondary | ICD-10-CM

## 2012-04-30 DIAGNOSIS — N289 Disorder of kidney and ureter, unspecified: Secondary | ICD-10-CM

## 2012-04-30 DIAGNOSIS — R296 Repeated falls: Secondary | ICD-10-CM | POA: Diagnosis present

## 2012-04-30 DIAGNOSIS — F1010 Alcohol abuse, uncomplicated: Secondary | ICD-10-CM

## 2012-04-30 DIAGNOSIS — Z6841 Body Mass Index (BMI) 40.0 and over, adult: Secondary | ICD-10-CM

## 2012-04-30 DIAGNOSIS — E669 Obesity, unspecified: Secondary | ICD-10-CM | POA: Diagnosis present

## 2012-04-30 DIAGNOSIS — D649 Anemia, unspecified: Secondary | ICD-10-CM

## 2012-04-30 DIAGNOSIS — E538 Deficiency of other specified B group vitamins: Secondary | ICD-10-CM

## 2012-04-30 DIAGNOSIS — I959 Hypotension, unspecified: Secondary | ICD-10-CM

## 2012-04-30 DIAGNOSIS — E875 Hyperkalemia: Secondary | ICD-10-CM | POA: Diagnosis present

## 2012-04-30 LAB — CBC
HCT: 38.8 % (ref 35.0–47.0)
MCH: 32.7 pg (ref 26.0–34.0)
MCHC: 32.6 g/dL (ref 32.0–36.0)
MCV: 100 fL (ref 80–100)
Platelet: 231 10*3/uL (ref 150–440)
RBC: 3.88 10*6/uL (ref 3.80–5.20)
RDW: 16 % — ABNORMAL HIGH (ref 11.5–14.5)

## 2012-04-30 LAB — URINALYSIS, COMPLETE
Glucose,UR: NEGATIVE mg/dL (ref 0–75)
Ketone: NEGATIVE
Nitrite: NEGATIVE
Ph: 5 (ref 4.5–8.0)
Protein: 30
Specific Gravity: 1.014 (ref 1.003–1.030)

## 2012-04-30 LAB — ETHANOL
Ethanol %: 0.181 % — ABNORMAL HIGH (ref 0.000–0.080)
Ethanol: 181 mg/dL

## 2012-04-30 LAB — COMPREHENSIVE METABOLIC PANEL
AST: 38 U/L — ABNORMAL HIGH (ref 0–37)
Albumin: 3.7 g/dL (ref 3.4–5.0)
Albumin: 3.2 g/dL — ABNORMAL LOW (ref 3.5–5.2)
Anion Gap: 14 (ref 7–16)
BUN: 31 mg/dL — ABNORMAL HIGH (ref 6–23)
Bilirubin,Total: 0.4 mg/dL (ref 0.2–1.0)
Calcium, Total: 8.2 mg/dL — ABNORMAL LOW (ref 8.5–10.1)
Chloride: 98 mmol/L (ref 98–107)
Chloride: 96 mEq/L (ref 96–112)
Creatinine, Ser: 1.74 mg/dL — ABNORMAL HIGH (ref 0.50–1.10)
Creatinine: 2.2 mg/dL — ABNORMAL HIGH (ref 0.60–1.30)
EGFR (African American): 26 — ABNORMAL LOW
Glucose: 75 mg/dL (ref 65–99)
Osmolality: 273 (ref 275–301)
Potassium: 4.9 mmol/L (ref 3.5–5.1)
SGOT(AST): 44 U/L — ABNORMAL HIGH (ref 15–37)
Total Protein: 7.5 g/dL (ref 6.4–8.2)
Total Protein: 6.3 g/dL (ref 6.0–8.3)

## 2012-04-30 LAB — DRUG SCREEN, URINE
Amphetamines, Ur Screen: NEGATIVE (ref ?–1000)
Benzodiazepine, Ur Scrn: NEGATIVE (ref ?–200)
Cannabinoid 50 Ng, Ur ~~LOC~~: NEGATIVE (ref ?–50)
MDMA (Ecstasy)Ur Screen: NEGATIVE (ref ?–500)
Opiate, Ur Screen: POSITIVE (ref ?–300)
Tricyclic, Ur Screen: NEGATIVE (ref ?–1000)

## 2012-04-30 LAB — LIPASE, BLOOD: Lipase: 40 U/L (ref 11–59)

## 2012-04-30 MED ORDER — ONDANSETRON HCL 4 MG/2ML IJ SOLN: 4.0000 mg | Freq: Four times a day (QID) | INTRAMUSCULAR | Status: DC | PRN

## 2012-04-30 MED ORDER — ONDANSETRON HCL 4 MG PO TABS: 4.0000 mg | ORAL_TABLET | Freq: Four times a day (QID) | ORAL | Status: DC | PRN

## 2012-04-30 MED ORDER — SODIUM CHLORIDE 0.9 % IV BOLUS (SEPSIS)
1000.0000 mL | Freq: Once | INTRAVENOUS | Status: AC
  Administered 2012-04-30: 1000 mL via INTRAVENOUS

## 2012-04-30 MED ORDER — ASPIRIN 81 MG PO CHEW
81.0000 mg | CHEWABLE_TABLET | Freq: Every day | ORAL | Status: DC
  Administered 2012-05-01 – 2012-05-02 (×2): 81 mg via ORAL
  Filled 2012-04-30 (×3): qty 1

## 2012-04-30 MED ORDER — SODIUM CHLORIDE 0.9 % IV BOLUS (SEPSIS)
500.0000 mL | Freq: Once | INTRAVENOUS | Status: AC
  Administered 2012-04-30: 500 mL via INTRAVENOUS

## 2012-04-30 MED ORDER — SODIUM CHLORIDE 0.9 % IV BOLUS (SEPSIS): 1000.0000 mL | Freq: Once | INTRAVENOUS | Status: AC

## 2012-04-30 MED ORDER — SODIUM CHLORIDE 0.9 % IJ SOLN
3.0000 mL | Freq: Two times a day (BID) | INTRAMUSCULAR | Status: DC
  Administered 2012-05-01 – 2012-05-06 (×9): 3 mL via INTRAVENOUS

## 2012-04-30 MED ORDER — FOLIC ACID 1 MG PO TABS
1.0000 mg | ORAL_TABLET | Freq: Every day | ORAL | Status: DC
  Administered 2012-05-01 – 2012-05-08 (×8): 1 mg via ORAL
  Filled 2012-04-30 (×9): qty 1

## 2012-04-30 MED ORDER — FERROUS FUMARATE 50 MG PO TBCR
50.0000 mg | EXTENDED_RELEASE_TABLET | Freq: Every day | ORAL | Status: DC
Start: 2012-04-30 — End: 2012-04-30

## 2012-04-30 MED ORDER — THIAMINE HCL 100 MG/ML IJ SOLN
100.0000 mg | Freq: Every day | INTRAMUSCULAR | Status: DC
  Administered 2012-04-30 – 2012-05-04 (×5): 100 mg via INTRAVENOUS
  Filled 2012-04-30: qty 2
  Filled 2012-04-30 (×4): qty 1

## 2012-04-30 MED ORDER — SODIUM CHLORIDE 0.9 % IV BOLUS (SEPSIS): 1000.0000 mL | Freq: Once | INTRAVENOUS | Status: DC

## 2012-04-30 MED ORDER — FLUTICASONE PROPIONATE 50 MCG/ACT NA SUSP: 2.0000 | Freq: Every day | NASAL | Status: DC | PRN

## 2012-04-30 MED ORDER — HYDROMORPHONE HCL PF 1 MG/ML IJ SOLN
0.5000 mg | INTRAMUSCULAR | Status: DC | PRN
  Administered 2012-05-02: 0.5 mg via INTRAVENOUS
  Filled 2012-04-30 (×2): qty 1

## 2012-04-30 MED ORDER — HYDROCODONE-ACETAMINOPHEN 5-325 MG PO TABS
1.0000 | ORAL_TABLET | ORAL | Status: DC | PRN
  Administered 2012-05-01 – 2012-05-03 (×11): 2 via ORAL
  Administered 2012-05-03: 1 via ORAL
  Administered 2012-05-03 (×2): 2 via ORAL
  Filled 2012-04-30 (×6): qty 2
  Filled 2012-04-30: qty 1
  Filled 2012-04-30 (×7): qty 2

## 2012-04-30 MED ORDER — FERROUS FUMARATE 325 (106 FE) MG PO TABS
1.0000 | ORAL_TABLET | Freq: Every day | ORAL | Status: DC
  Administered 2012-05-01 – 2012-05-07 (×6): 106 mg via ORAL
  Filled 2012-04-30 (×8): qty 1

## 2012-04-30 MED ORDER — OXYBUTYNIN CHLORIDE 5 MG PO TABS
5.0000 mg | ORAL_TABLET | Freq: Two times a day (BID) | ORAL | Status: DC
  Administered 2012-05-01 – 2012-05-08 (×15): 5 mg via ORAL
  Filled 2012-04-30 (×17): qty 1

## 2012-04-30 MED ORDER — CITALOPRAM HYDROBROMIDE 20 MG PO TABS
20.0000 mg | ORAL_TABLET | Freq: Every day | ORAL | Status: DC
  Administered 2012-05-01 – 2012-05-08 (×8): 20 mg via ORAL
  Filled 2012-04-30 (×8): qty 1

## 2012-04-30 MED ORDER — ALLOPURINOL 300 MG PO TABS
300.0000 mg | ORAL_TABLET | Freq: Every day | ORAL | Status: DC
  Administered 2012-05-01 – 2012-05-08 (×8): 300 mg via ORAL
  Filled 2012-04-30 (×8): qty 1

## 2012-04-30 MED ORDER — LEVOTHYROXINE SODIUM 75 MCG PO TABS
75.0000 ug | ORAL_TABLET | Freq: Every day | ORAL | Status: DC
  Administered 2012-05-01 – 2012-05-08 (×8): 75 ug via ORAL
  Filled 2012-04-30 (×9): qty 1

## 2012-04-30 MED ORDER — SODIUM CHLORIDE 0.9 % IV SOLN
INTRAVENOUS | Status: DC
  Administered 2012-05-01: 03:00:00 via INTRAVENOUS

## 2012-04-30 MED ORDER — TIZANIDINE HCL 2 MG PO TABS
2.0000 mg | ORAL_TABLET | Freq: Two times a day (BID) | ORAL | Status: DC
  Administered 2012-05-01 – 2012-05-08 (×15): 2 mg via ORAL
  Filled 2012-04-30 (×17): qty 1

## 2012-04-30 NOTE — Consult Note (Addendum)
[facility] Medical Consultation  Stacey Cooley ZOX:[id number] DOB: 1940/09/14 DOA: 04/30/2012 PCP: Yates Decamp, MD   Requesting physician: Dr. Jodi Geralds Date of consultation: 04/30/2012 Reason for consultation: Hypotension and management of chronic medical problems  Impression/Recommendations  Principal Problem:  *Cervical spine fracture - per primary team - supportive care with analgesia as needed Active Problems:  Hypotension, unspecified - likely multifactorial in etiology and secondary to dehydration, poor oral intake, use of antihypertensives - pt is on Metoprolol, Benazepril, and was also on Lasix but that was discontinued several days prior to this admission due to renal failure - will provide IVF, hold off on Metoprolol and Benazepril - request stepdown unit for now as pt is still hypotensive with IV boluses challenge   Alcohol abuse - actively drinking and transaminitis with AST > ALT suggestive of it as well - will place on CIWA protocol and will monitor for signs of withdrawal but no signs of withdrawal at this point - will also provide MVI, Thiamine and Folate - social work consult for alcohol cessation   Renal insufficiency - this was determined to be secondary to Lasix use and Lasix was discontinued by PCP - renal function has resolved and Cr is within normal limits  Anemia - of chronic disease and with baseline Hg ~10 - continue Iron per home medication regimen - will obtain CBC in AM  Nausea - possibly related to alcohol intoxication, will also check lipase level - keep NPO for now and provide antiemetics as needed  Transaminitis - in the pattern suggestive of alcohol abuse  Hyperkalemia - will repeat CMP  to ensure this is resolved now  Hypothyroidism - will continue Synthroid  Depression  - continue Celexa  I will followup again tomorrow. Please contact me if I can be of assistance in the meanwhile. Thank you for this consultation.  Chief  Complaint: Neck pain, C1-C2 cervical fracture after the fall  HPI:  Pt is 71 yo female with history of uterine cancer and status post hysterectomy, HTN, anemia of chronic disease, chronic kidney disease stage I- II, who presented to [facility] ED after a fall at home, when she tripped over the carpet and has sustained neck fracture. Pt was seen at the [facility] Emergency department today the day of the admission and was found to have sustained cervical C1 - C2 fracture. Pt explains her neck pain is constant and sharp in nature, 7/10 in severity, aggravated by even minimal movement and changes in the position and with no specific alleviating factors. Pain is non radiating and is associated with generalized. Pt does admit to drinking several alcoholic beverages earlier today and has not been eating or drinking other fluids. She denies chest pain, shortness of breath, no fevers, chills, no specific abdominal or urinary concerns. Please note that is rather poor historian overall.  Review of Systems:  Constitutional: Negative for fever, chills, diaphoresis, activity change, appetite change and fatigue.  HENT: Negative for ear pain, nosebleeds, congestion, facial swelling, rhinorrhea, neck pain, neck stiffness and ear discharge.   Eyes: Negative for pain, discharge, redness, itching and visual disturbance.  Respiratory: Negative for cough, choking, chest tightness, shortness of breath, wheezing and stridor.   Cardiovascular: Negative for chest pain, palpitations and leg swelling.  Gastrointestinal: Negative for abdominal distention. Positive for nausea Genitourinary: Negative for dysuria, urgency, frequency, hematuria, flank pain, decreased urine volume, difficulty urinating and dyspareunia.  Musculoskeletal: Negative for back pain, joint swelling, arthralgias and gait problem.  Neurological: Negative for dizziness,  tremors, seizures, syncope, facial asymmetry, speech difficulty, weakness, light-headedness,  numbness and headaches.  Hematological: Negative for adenopathy. Does not bruise/bleed easily.  Psychiatric/Behavioral: Negative for hallucinations, behavioral problems, confusion, dysphoric mood, decreased concentration and agitation.    Past Medical History  Diagnosis Date  . HTN (hypertension)   . Morbid obesity   . DDD (degenerative disc disease), cervical   . Rheumatic fever     child  . Dyslipidemia   . Swelling of joint of lower leg     recurrent lower extremity leg swelling, noted by pt  . 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 Anemia   . Hypothyroid   . Renal insufficiency 09/06/2011  . Varicose vein of leg   . Uterine cancer 1996    s/p hysterectomy; patient reported low stage; did not need adjuvant chemo  or radiation.   Marland Kitchen MRSA (methicillin resistant staph aureus) culture positive    Past Surgical History  Procedure Date  . Anterior cervical decompression and fusion 09/2009    involving C4-7 levels as well as a posterior cervical fusion prcedure from C3 to T  . Tonsillectomy 1966  . Gastric stapling 12/1984 and 2001  . Abdominal hysterectomy 1996  . Right hip replacement 1997  . Right ulnar nerve decompression 2005  . Right carpal tunnel release 2005  . Total left knee replacement 2006   Social History:  reports that she quit smoking about 20 years ago. She does not have any smokeless tobacco history on file. She reports that she does not drink alcohol or use illicit drugs.  Allergies  Allergen Reactions  . Latex Itching  . Penicillins Rash  . Sulfonamide Derivatives Rash   Family History  Problem Relation Age of Onset  . Coronary artery disease Neg Hx   . Cancer Mother     lung cancer  . Liver disease Father   . Anemia Sister     Medication Sig  TYLENOL #3) 300-30 MG per tablet Take 1 tablet by mouth 2 (two) times daily.    allopurinol300 MG tablet Take 300 mg by mouth daily.  aspirin 81 MG tablet Take 81 mg by mouth at bedtime.   benazepril 20 MG tablet Take 20 mg by mouth every morning.   CELEBREX 100 MG capsule Take 100 mg by mouth every morning.   citalopram (CELEXA) 20 MG tablet Take 20 mg by mouth daily.  ferrous fumarate 50 MG CR tablet Take 50 mg by mouth at bedtime.  fluticasone  27.5 MCG nasal spray Place 2 sprays into the nose daily as needed.   levothyroxine  75 MCG tablet Take 75 mcg by mouth daily.    metoprolol tartrate 25 MG tablet Take 25 mg by mouth 2 (two) times daily.    oxybutynin 5 MG tablet Take 5 mg by mouth Daily.  tiZANidine (ZANAFLEX) 2 MG tablet Take 2 mg by mouth 2 (two) times daily.    Physical Exam: Blood pressure 68/30, pulse 88, temperature 98.2 F (36.8 C), resp. rate 18, SpO2 99.00%. Filed Vitals:   04/30/12 1932 04/30/12 2013  BP: 92/34 68/30  Pulse: 88   Temp: 98.2 F (36.8 C)   Resp: 18   SpO2: 99%     Physical Exam  Constitutional: Appears well-developed and well-nourished. No distress.  HENT: Normocephalic. External right and left ear normal. Dry MM Eyes:  Conjunctivae and EOM are normal. PERRLA, no scleral icterus.  Neck: Neck collar in place CVS: RRR, S1/S2 +, no murmurs, no gallops, no carotid bruit.  Pulmonary: Effort and breath sounds normal, no stridor, rhonchi, wheezes, rales.  Abdominal: Soft. BS +,  no distension, tenderness, rebound or guarding.  Musculoskeletal: Normal range of motion. Trace bilateral pitting edema and no tenderness.  Lymphadenopathy: No lymphadenopathy noted, cervical, inguinal. Neuro: Alert. Normal reflexes, muscle tone coordination. No cranial nerve deficit. Skin: Skin is warm and dry. No rash noted. Not diaphoretic. No erythema. No pallor.  Psychiatric: Normal mood and affect. Behavior, judgment, thought content normal.   Labs on Admission:  Basic Metabolic Panel:  Lab 04/26/12 1610  NA 139  K 5.3*  CL 102  CO2 26   GLUCOSE 82  BUN 16.0  CREATININE 1.0  CALCIUM 10.2  MG --  PHOS --   Liver Function Tests:  Lab 04/26/12 1018  AST 39*  ALT 30  ALKPHOS 121  BILITOT 0.45  PROT 6.8  ALBUMIN 3.8   CBC:  Lab 04/26/12 1018  WBC 6.0  NEUTROABS 2.8  HGB 11.1*  HCT 33.9*  MCV 98.0  PLT 215   Radiological Exams on Admission: No results found.  EKG: No EKG obtained, will order one and will follow up on the results  Time spent: 30 minutes  Debbora Presto [facility] Pager [phone]  If 7PM-7AM, please contact night-coverage [email or web address] Password [facility] 04/30/2012, 8:19 PM

## 2012-04-30 NOTE — ED Provider Notes (Addendum)
History     CSN: [id number]  Arrival date & time 04/30/12  7829   First MD Initiated Contact with Patient 04/30/12 1943      Chief Complaint  Patient presents with  . Fall    (Consider location/radiation/quality/duration/timing/severity/associated sxs/prior treatment) Patient is a 71 y.o. female presenting with fall.  Fall   Patient reports that she tripped over carpet in her home 2 days ago injuring her neck. She was  evaluated at [facility] emergency department today determined to have a cervical spine fracture sent here for evaluation by Dr. Luiz Blare who arrived within a few minutes of patient's arrival to the emergency department. She presently complains of neck pain, no other complaint pain is nonradiating. She denies abdominal pain, denies chest pain denies other injury neck pain is worse with movement. Improved with remaining still. She admits to drinking several cocktails earlier today. Denies eating today.  Past Medical History  Diagnosis Date  . HTN (hypertension)   . Morbid obesity   . DDD (degenerative disc disease), cervical   . Rheumatic fever     child  . Dyslipidemia   . Swelling of joint of lower leg     recurrent lower extremity leg swelling, noted by pt  . 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 Anemia   . Hypothyroid   . Renal insufficiency 09/06/2011  . Varicose vein of leg   . Uterine cancer 1996    s/p hysterectomy; patient reported low stage; did not need adjuvant chemo  or radiation.   Marland Kitchen MRSA (methicillin resistant staph aureus) culture positive     Past Surgical History  Procedure Date  . Anterior cervical decompression and fusion 09/2009    involving C4-7 levels as well as a posterior cervical fusion prcedure from C3 to T  . Tonsillectomy 1966  . Gastric stapling 12/1984 and 2001  . Abdominal hysterectomy 1996  . Right  hip replacement 1997  . Right ulnar nerve decompression 2005  . Right carpal tunnel release 2005  . Total left knee replacement 2006    Family History  Problem Relation Age of Onset  . Coronary artery disease Neg Hx   . Cancer Mother     lung cancer  . Liver disease Father   . Anemia Sister     History  Substance Use Topics  . Smoking status: Former Smoker -- 0.2 packs/day for 10 years    Quit date: 05/25/1991  . Smokeless tobacco: Not on file     Comment: no smoking  . Alcohol Use: No    OB History    Grav Para Term Preterm Abortions TAB SAB Ect Mult Living                  Review of Systems  HENT: Positive for neck pain.     Allergies  Latex; Penicillins; and Sulfonamide derivatives  Home Medications   Current Outpatient Rx  Name  Route  Sig  Dispense  Refill  . ACETAMINOPHEN-CODEINE #3 300-30 MG PO TABS   Oral   Take 1 tablet by mouth 2 (two) times daily.          . ALLOPURINOL 300 MG PO TABS   Oral   Take 300 mg by mouth daily.         . ASPIRIN 81 MG PO TABS  Oral   Take 81 mg by mouth at bedtime.          Marland Kitchen BENAZEPRIL HCL 20 MG PO TABS   Oral   Take 20 mg by mouth every morning.          . CELEBREX 100 MG PO CAPS   Oral   Take 100 mg by mouth every morning. May take an additional capsule at night if needed for pain         . CITALOPRAM HYDROBROMIDE 20 MG PO TABS   Oral   Take 20 mg by mouth daily.         Marland Kitchen FERROUS FUMARATE ER 50 MG PO TBCR   Oral   Take 50 mg by mouth at bedtime.         Marland Kitchen FLUTICASONE FUROATE 27.5 MCG/SPRAY NA SUSP   Nasal   Place 2 sprays into the nose daily as needed. For nasal congestion         . LEVOTHYROXINE SODIUM 75 MCG PO TABS   Oral   Take 75 mcg by mouth daily.           Marland Kitchen METOPROLOL TARTRATE 25 MG PO TABS   Oral   Take 25 mg by mouth 2 (two) times daily.           . OXYBUTYNIN CHLORIDE 5 MG PO TABS   Oral   Take 5 mg by mouth Daily.         Marland Kitchen TIZANIDINE HCL 2 MG PO TABS    Oral   Take 2 mg by mouth 2 (two) times daily.            BP 92/34  Pulse 88  Temp 98.2 F (36.8 C)  Resp 18  SpO2 99%  Physical Exam  Nursing note and vitals reviewed. Constitutional: She appears well-developed and well-nourished.  HENT:       Purplish ecchymosis to the forehead  Eyes: Conjunctivae normal are normal. Pupils are equal, round, and reactive to light.  Neck: No tracheal deviation present. No thyromegaly present.       In [place] collar, no point tenderness  Cardiovascular: Normal rate and regular rhythm.   No murmur heard. Pulmonary/Chest: Effort normal and breath sounds normal.  Abdominal: Soft. Bowel sounds are normal. She exhibits no distension. There is no tenderness.       Obese nontender  Musculoskeletal: Normal range of motion. She exhibits no edema and no tenderness.  Neurological: She is alert. Coordination normal.       Glasgow Coma Score 15 moves all extremities well motor strength 5 over 5 overall  Skin: Skin is warm and dry. No rash noted.  Psychiatric: She has a normal mood and affect.    ED Course  Procedures (including critical care time)  Labs Reviewed - No data to display No results found.   No diagnosis found.    MDM  Patient noted to be hypotensive upon arrival. Likely due to dehydration. She exhibits no abdominal tenderness and has no complaint of abdominal pain suffered a ground-level fall 2 days ago. Dr. Luiz Blare to arrange for admission. He requested I call internal medicine for consultation. 8:15 PM I spoke with Dr. Izola Price from the hospitalist service who will act as consultant on patient Diagnosis #1 fall #2 cervical spine fracture        Doug Sou, MD 04/30/12 2015  Doug Sou, MD 04/30/12 2019

## 2012-04-30 NOTE — ED Notes (Signed)
Pt Contact- Terilee Gianopoukos Angelos (Neighbor) : [phone]

## 2012-04-30 NOTE — H&P (Signed)
CC: neck pain  Stacey Cooley is an 71 y.o. female.  HPI: 71 yo female who fell Thursday and had been walking around her house and began having pain today and went to [facility] ER .  She was found to have an Odontoid fracture and a ring of C-! Fracture  She was transferred to [facility] because " we cannot put on a hard collar in the ER at [facility]."  She has had previous fusion C3- C7.  She is c/o pain and completely denies numbness or tingling in upper or lower ext.   Past Medical History  Diagnosis Date  . HTN (hypertension)   . Morbid obesity   . DDD (degenerative disc disease), cervical   . Rheumatic fever     child  . Dyslipidemia   . Swelling of joint of lower leg     recurrent lower extremity leg swelling, noted by pt  . 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 Anemia   . Hypothyroid   . Renal insufficiency 09/06/2011  . Varicose vein of leg   . Uterine cancer 1996    s/p hysterectomy; patient reported low stage; did not need adjuvant chemo  or radiation.   Marland Kitchen MRSA (methicillin resistant staph aureus) culture positive     Past Surgical History  Procedure Date  . Anterior cervical decompression and fusion 09/2009    involving C4-7 levels as well as a posterior cervical fusion prcedure from C3 to T  . Tonsillectomy 1966  . Gastric stapling 12/1984 and 2001  . Abdominal hysterectomy 1996  . Right hip replacement 1997  . Right ulnar nerve decompression 2005  . Right carpal tunnel release 2005  . Total left knee replacement 2006    Family History  Problem Relation Age of Onset  . Coronary artery disease Neg Hx   . Cancer Mother     lung cancer  . Liver disease Father   . Anemia Sister     Social History:  reports that she quit smoking about 20 years ago. She does not have any smokeless tobacco history on file. She reports that she does not drink  alcohol or use illicit drugs.  Allergies:  Allergies  Allergen Reactions  . Latex Itching  . Penicillins Rash  . Sulfonamide Derivatives Rash   Current facility-administered medications:[COMPLETED] sodium chloride 0.9 % bolus 1,000 mL, 1,000 mL, Intravenous, Once, Doug Sou, MD, 1,000 mL at 04/30/12 1938 Current outpatient prescriptions:acetaminophen-codeine (TYLENOL #3) 300-30 MG per tablet, Take 1 tablet by mouth 2 (two) times daily. , Disp: , Rfl: ;  allopurinol (ZYLOPRIM) 300 MG tablet, Take 300 mg by mouth daily., Disp: , Rfl: ;  aspirin 81 MG tablet, Take 81 mg by mouth at bedtime. , Disp: , Rfl: ;  benazepril (LOTENSIN) 20 MG tablet, Take 20 mg by mouth every morning. , Disp: , Rfl:  CELEBREX 100 MG capsule, Take 100 mg by mouth every morning. May take an additional capsule at night if needed for pain, Disp: , Rfl: ;  citalopram (CELEXA) 20 MG tablet, Take 20 mg by mouth daily., Disp: , Rfl: ;  ferrous fumarate (FERRO-SEQUELS) 50 MG CR tablet, Take 50 mg by mouth at bedtime., Disp: , Rfl: ;  fluticasone (VERAMYST) 27.5 MCG/SPRAY nasal spray, Place 2 sprays into the nose daily as needed. For nasal congestion, Disp: , Rfl:  levothyroxine (SYNTHROID, LEVOTHROID) 75 MCG tablet, Take 75 mcg by mouth daily.  , Disp: , Rfl: ;  metoprolol tartrate (LOPRESSOR) 25 MG tablet, Take 25 mg by mouth 2 (two) times daily.  , Disp: , Rfl: ;  oxybutynin (DITROPAN) 5 MG tablet, Take 5 mg by mouth Daily., Disp: , Rfl: ;  tiZANidine (ZANAFLEX) 2 MG tablet, Take 2 mg by mouth 2 (two) times daily. , Disp: , Rfl:  Medications: Prior to Admission:  (Not in a hospital admission)  No results found for this or any previous visit (from the past 48 hour(s)).  No results found.  ROS: I have reviewed the patient's review of systems thoroughly and there are no positive responses as relates to the HPI.  Pt c/o ankle pain chronic EXAM Blood pressure 68/30, pulse 88, temperature 98.2 F (36.8 C), resp. rate 18, SpO2  99.00%. Well-developed well-nourished patient in no acute distress. Alert and oriented x3 HEENT:within normal limits Cardiac: Regular rate and rhythm Pulmonary: Lungs clear to auscultation Abdomen: Soft and nontender.  Normal active bowel sounds  Musculoskeletal: upper ext exam --nl strength,reflexes.and sensation Lower ext-- nl strength,reflexes,and sensation  Labs from  show  GLC75 BUN30 CR2.2 NA134 CL98 TB0.4  UA mult wbc and many bact Urine + for opiates BLOOD ALCOHOL .181  WBC 13.2 HGB 12.7  PLt 231  X-ray CT of head shows brain atrophy but no acute findings CT of neck shows Odontoid fracture and ring of c1 fracture  Assessment/Plan: 71 yo female who is drunk and dehydrated with neck fracture of ring of c-1 and odontoid.  Pt transferred for evaluation and treatment. I will consult medicine to assist with hydration and alcohol issues.  I will place a [place] collar for tonight I spoke with Dr. Yevette Edwards who has previously operated on this patient and he will assume her care in the AM.  Ledell Codrington L 04/30/2012, 8:35 PM

## 2012-04-30 NOTE — ED Notes (Signed)
Patient reports drinking a couple of screw drivers tonight.

## 2012-04-30 NOTE — ED Notes (Signed)
PER EMS- Pt fell 2 days ago. Patients neighbors took pt to the hospital today. Identified C1 and C2 fracture. Pt currently alertx4 NAD at this time. Vitals WNL. BP 97/41 P 82 97% RA.

## 2012-05-01 ENCOUNTER — Encounter (HOSPITAL_COMMUNITY): Payer: Self-pay

## 2012-05-01 ENCOUNTER — Inpatient Hospital Stay (HOSPITAL_COMMUNITY): Payer: Medicare Other

## 2012-05-01 DIAGNOSIS — I959 Hypotension, unspecified: Secondary | ICD-10-CM

## 2012-05-01 DIAGNOSIS — N289 Disorder of kidney and ureter, unspecified: Secondary | ICD-10-CM

## 2012-05-01 LAB — COMPREHENSIVE METABOLIC PANEL
ALT: 21 U/L (ref 0–35)
AST: 31 U/L (ref 0–37)
Alkaline Phosphatase: 95 U/L (ref 39–117)
CO2: 24 mEq/L (ref 19–32)
Chloride: 99 mEq/L (ref 96–112)
GFR calc Af Amer: 47 mL/min — ABNORMAL LOW (ref >90)
GFR calc non Af Amer: 41 mL/min — ABNORMAL LOW (ref >90)
Glucose, Bld: 108 mg/dL — ABNORMAL HIGH (ref 70–99)
Potassium: 4.9 mEq/L (ref 3.5–5.1)
Sodium: 136 mEq/L (ref 135–145)
Total Bilirubin: 0.4 mg/dL (ref 0.3–1.2)

## 2012-05-01 LAB — CBC
Hemoglobin: 10.6 g/dL — ABNORMAL LOW (ref 12.0–15.0)
MCH: 31 pg (ref 26.0–34.0)
MCHC: 32.6 g/dL (ref 30.0–36.0)
RDW: 14.8 % (ref 11.5–15.5)

## 2012-05-01 MED ORDER — CHLORHEXIDINE GLUCONATE 0.12 % MT SOLN
15.0000 mL | Freq: Two times a day (BID) | OROMUCOSAL | Status: DC
  Administered 2012-05-01 – 2012-05-08 (×13): 15 mL via OROMUCOSAL
  Filled 2012-05-01 (×18): qty 15

## 2012-05-01 MED ORDER — METOPROLOL TARTRATE 12.5 MG HALF TABLET
12.5000 mg | ORAL_TABLET | Freq: Two times a day (BID) | ORAL | Status: DC
  Administered 2012-05-01 – 2012-05-08 (×14): 12.5 mg via ORAL
  Filled 2012-05-01 (×18): qty 1

## 2012-05-01 MED ORDER — BIOTENE DRY MOUTH MT LIQD
15.0000 mL | Freq: Two times a day (BID) | OROMUCOSAL | Status: DC
  Administered 2012-05-03 – 2012-05-08 (×10): 15 mL via OROMUCOSAL

## 2012-05-01 MED ORDER — WHITE PETROLATUM GEL
Status: AC
  Filled 2012-05-01: qty 5

## 2012-05-01 MED ORDER — SODIUM POLYSTYRENE SULFONATE 15 GM/60ML PO SUSP
15.0000 g | Freq: Once | ORAL | Status: AC
  Administered 2012-05-01: 15 g via ORAL
  Filled 2012-05-01: qty 60

## 2012-05-01 NOTE — Consult Note (Signed)
CONSULT  Chief Complaint: neck pain  HPI: Stacey Cooley is a 71 y.o. female who was transferred to [place] with neck pain and a CT scan that revealed a C1 and C2 fracture.  Patient is immobilized in hard cervical collar. Patient denies arm or leg pain or weakness. Patient fell around 2-3 pm yesterday and her head hit a table in her home.  She was alone.  Pain is severe and increased with head ROM.     Past Medical History  Diagnosis Date  . HTN (hypertension)   . Morbid obesity   . DDD (degenerative disc disease), cervical   . Rheumatic fever     child  . Dyslipidemia   . Swelling of joint of lower leg     recurrent lower extremity leg swelling, noted by pt  . 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 Anemia   . Hypothyroid   . Renal insufficiency 09/06/2011  . Varicose vein of leg   . Uterine cancer 1996    s/p hysterectomy; patient reported low stage; did not need adjuvant chemo  or radiation.   Marland Kitchen MRSA (methicillin resistant staph aureus) culture positive   . TACHYCARDIA 10/17/2009    Qualifier: Diagnosis of  By: Mariah Milling MD, Tim    . Complication of anesthesia    Past Surgical History  Procedure Date  . Anterior cervical decompression and fusion 09/2009    involving C4-7 levels as well as a posterior cervical fusion prcedure from C3 to T  . Tonsillectomy 1966  . Gastric stapling 12/1984 and 2001  . Abdominal hysterectomy 1996  . Right hip replacement 1997  . Right ulnar nerve decompression 2005  . Right carpal tunnel release 2005  . Total left knee replacement 2006   History   Social History  . Marital Status: Married    Spouse Name: N/A    Number of Children: N/A  . Years of Education: N/A   Social History Main Topics  . Smoking status: Former Smoker -- 0.2 packs/day for 10 years    Quit date: 05/25/1991  . Smokeless tobacco: None   Comment: no smoking  . Alcohol Use: No  . Drug Use: No  . Sexually Active: No   Other Topics Concern  . None   Social History Narrative   Lives with husband.    Family History  Problem Relation Age of Onset  . Coronary artery disease Neg Hx   . Cancer Mother     lung cancer  . Liver disease Father   . Anemia Sister    Allergies  Allergen Reactions  . Latex Itching  . Penicillins Rash  . Sulfonamide Derivatives Rash   Prior to Admission medications   Medication Sig Start Date End Date Taking? Authorizing Provider  acetaminophen-codeine (TYLENOL #3) 300-30 MG per tablet Take 1 tablet by mouth 2 (two) times daily.  07/28/11  Yes Historical Provider, MD  allopurinol (ZYLOPRIM) 300 MG tablet Take 300 mg by mouth daily.   Yes Historical Provider, MD  aspirin 81 MG tablet Take 81 mg by mouth at bedtime.    Yes Historical Provider, MD  benazepril (LOTENSIN) 20 MG tablet Take 20 mg by mouth every morning.    Yes Historical Provider, MD  CELEBREX 100 MG capsule Take 100 mg by mouth every morning. May take an additional capsule at night  if needed for pain 07/16/11  Yes Historical Provider, MD  citalopram (CELEXA) 20 MG tablet Take 20 mg by mouth daily.   Yes Historical Provider, MD  ferrous fumarate (FERRO-SEQUELS) 50 MG CR tablet Take 50 mg by mouth at bedtime.   Yes Historical Provider, MD  fluticasone (VERAMYST) 27.5 MCG/SPRAY nasal spray Place 2 sprays into the nose daily as needed. For nasal congestion   Yes Historical Provider, MD  levothyroxine (SYNTHROID, LEVOTHROID) 75 MCG tablet Take 75 mcg by mouth daily.     Yes Historical Provider, MD  metoprolol tartrate (LOPRESSOR) 25 MG tablet Take 25 mg by mouth 2 (two) times daily.     Yes Historical Provider, MD  oxybutynin (DITROPAN) 5 MG tablet Take 5 mg by mouth Daily. 07/28/11  Yes Historical Provider, MD  tiZANidine (ZANAFLEX) 2 MG tablet Take 2 mg by mouth 2 (two) times daily.  07/17/11  Yes Historical Provider, MD     All other  systems have been reviewed and were otherwise negative with the exception of those mentioned in the HPI and as above.  Physical Exam: Filed Vitals:   05/01/12 0605  BP: 155/63  Pulse: 97  Temp: 98.7 F (37.1 C)  Resp: 16    General: Alert, no acute distress Cardiovascular: No pedal edema Respiratory: No cyanosis, no use of accessory musculature Skin: No lesions in the area of chief complaint Neurologic: 5/5 strength at UEs and LEs.  Sensation in tact. Pain with attempted ROM of neck Psychiatric: Patient is competent for consent with normal mood and affect   CT from yesterday reviewed - notable for nondisplaced type II dens fracture and C12 arch fracture involving anterior and posterior arches - nondisplaced.   Assessment Nondosplaced C1/C2 fractures  PLAN: Patient will need to be immobilized in a hard cervical collar vs. halo vest.  Need to review CT images in additional detail. Will call husband and have him bring patient's hard collar and will obtain additional AP and lateral images of her C spine today.  Will follow-up with patient to discuss definitive treatment.  Patient does understand that these may go on to need surgery if fractures do not heal with immobilization.      Emilee Hero, MD 05/01/2012 8:34 AM

## 2012-05-01 NOTE — Progress Notes (Signed)
PT Treatment Note:  Pt with PT evaluation order this am. Noted MD note to review CT images and decide on treatment. MD d/c PT order at present. Please reorder when pt ready for mobility with PT. Thanks!   05/01/2012 Cephus Shelling, PT, DPT [phone]

## 2012-05-01 NOTE — Progress Notes (Signed)
[facility] Consult follow up Note   Stacey Cooley AVW:[id number] DOB: 1940-09-26 DOA: 04/30/2012 PCP: Yates Decamp, MD  Requesting Physician:  Dr. Jodi Geralds  Date of consultation: 04/30/2012  Reason for consultation: Hypotension and management of chronic medical problems  Assessment/Plan:  Principal Problem:  *Cervical spine fracture  - per primary team   Active Problems:   Hypotension, unspecified  - likely multifactorial in etiology and secondary to dehydration, poor oral intake, use of antihypertensives  - Will restart metoprolol at half home dose. - Continue to hold Benazepril.  If BP and creatinine continue to improve would add back benazepril 12/10. - Continue IVF at 50 ml/hour as she is not eating well.  Alcohol abuse  - actively drinking prior to admission - on CIWA protocol and will monitor for signs of withdrawal but no signs of withdrawal at this point  - will also provide MVI, Thiamine and Folate  - social work consult for alcohol cessation   Acute kidney injury in setting of Renal insufficiency  - renal function has improved and Cr is now close to baseline (1.25) - this was determined to be secondary to Lasix use and Lasix was discontinued by PCP   Anemia  - Stable. - of chronic disease and with baseline Hg ~10.   - continue Iron per home medication regimen   Nausea  - Resolved. - possibly related to alcohol intoxication.   Lipase 40 (wnl) - on clear liquid diet  Hyperkalemia  - Resolved.  Hypothyroidism  - stable. - will continue Synthroid   Depression  - Stable. - continue Celexa   Code Status: full Family Communication: Disposition Plan: Per primary team   Consultants:  [facility]  Procedures:    Antibiotics:    HPI/Subjective: Patient worried about her mother.  Complains of constipation.  Objective: Filed Vitals:   04/30/12 2300 04/30/12 2300 05/01/12 0013 05/01/12 0605  BP: 108/40 94/30 136/53 155/63   Pulse: 96 97 96 97  Temp:   99 F (37.2 C) 98.7 F (37.1 C)  TempSrc:   Oral   Resp: 16 16 16 16   Height:   5\' 2"  (1.575 m)   Weight:   122.018 kg (269 lb)   SpO2: 93% 92% 96% 95%    Intake/Output Summary (Last 24 hours) at 05/01/12 1132 Last data filed at 05/01/12 0906  Gross per 24 hour  Intake      0 ml  Output   1350 ml  Net  -1350 ml   Filed Weights   05/01/12 0013  Weight: 122.018 kg (269 lb)    Exam:   General:  Awake A&O, in firm cervical collar.  Cardiovascular: RRR no M/R/G  Respiratory: No W/C/R no accessory muscle use  Abdomen: Obese, NT, ND, +BS, no masses  Extremities:  Minimal bilateral lower extremity edema, slight erythema in left distal lower extremity.  Data Reviewed: Basic Metabolic Panel:  Lab 05/01/12 8295 04/30/12 2144 04/26/12 1018  NA 136 135 139  K 4.9 5.4* 5.3*  CL 99 96 102  CO2 24 17* 26  GLUCOSE 108* 63* 82  BUN 28* 31* 16.0  CREATININE 1.30* 1.74* 1.0  CALCIUM 8.2* 8.3* 10.2  MG -- -- --  PHOS -- -- --   Liver Function Tests:  Lab 05/01/12 0612 04/30/12 2144 04/26/12 1018  AST 31 38* 39*  ALT 21 23 30   ALKPHOS 95 94 121  BILITOT 0.4 0.3 0.45  PROT 6.2 6.3 6.8  ALBUMIN 3.1* 3.2* 3.8  Lab 04/30/12 2144  LIPASE 40  AMYLASE --   CBC:  Lab 05/01/12 0612 04/26/12 1018  WBC 10.4 6.0  NEUTROABS -- 2.8  HGB 10.6* 11.1*  HCT 32.5* 33.9*  MCV 95.0 98.0  PLT 194 215     Recent Results (from the past 240 hour(s))  SURGICAL PCR SCREEN     Status: Normal   Collection Time   05/01/12  3:07 AM      Component Value Range Status Comment   MRSA, PCR NEGATIVE  NEGATIVE Final    Staphylococcus aureus NEGATIVE  NEGATIVE Final      Studies: Dg Cervical Spine 2-3 Views  05/01/2012  *RADIOLOGY REPORT*  Clinical Data: Followup C1-2 fractures.  CERVICAL SPINE - 2-3 VIEW  Comparison: None.  Findings: Previous anterior cervical disc fusions are seen from levels of C4-C7, and posterior pedicle screws and fixation rods are  seen extending from levels of C3-T1.  Fracture of the base of the odontoid process is seen with mild dorsal displacement and angulation.  Anterior arch of C1 also appears fractured, without evidence of atlantoaxial subluxation.  IMPRESSION:  1.  Odontoid fracture with mild dorsal displacement and angulation. 2.  Fracture of the anterior pressures C1, without evidence of atlantoaxial subluxation.   Original Report Authenticated By: Myles Rosenthal, M.D.     Scheduled Meds:   . allopurinol  300 mg Oral Daily  . antiseptic oral rinse  15 mL Mouth Rinse q12n4p  . aspirin  81 mg Oral QHS  . chlorhexidine  15 mL Mouth Rinse BID  . citalopram  20 mg Oral Daily  . ferrous fumarate  1 tablet Oral QHS  . folic acid  1 mg Oral Daily  . levothyroxine  75 mcg Oral QAC breakfast  . oxybutynin  5 mg Oral BID  . [COMPLETED] sodium chloride  1,000 mL Intravenous Once  . [COMPLETED] sodium chloride  1,000 mL Intravenous Once  . [COMPLETED] sodium chloride  500 mL Intravenous Once  . sodium chloride  3 mL Intravenous Q12H  . [COMPLETED] sodium polystyrene  15 g Oral Once  . thiamine  100 mg Intravenous Daily  . tiZANidine  2 mg Oral BID  . [DISCONTINUED] ferrous fumarate  50 mg Oral QHS  . [DISCONTINUED] sodium chloride  1,000 mL Intravenous Once   Continuous Infusions:   . sodium chloride 75 mL/hr at 05/01/12 1610    Principal Problem:  *Cervical spine fracture Active Problems:  Hypotension, unspecified  Renal insufficiency  Anemia  Alcohol abuse  Nausea  Transaminitis  Hyperkalemia    Time spent: 25 min.    Stephani Police  [facility] Pager [phone] If 8PM-8AM, please contact night-coverage at [email or web address], password [facility] 05/01/2012, 11:32 AM  LOS: 1 day

## 2012-05-01 NOTE — Progress Notes (Signed)
Since this AM, I have additionally reviewed CT and ordered and reviewed cervical films.  Patient is noted to have mild angulation and minimal posterior displacement of dens. Plan is to go forward with a rigid cervicothoracic orthosis to optimally immobilize fracture. Halo vest treatment is another option, but there are certainly risks and morbidity associated with this options as well.  Patient does understand that there does remain a risk of a nonunion, which may require operative intervention in the future. Kathlene November from [facility] has been contacted and will be fitting patient for brace later today. I will subsequently re-evaluate patient to ensure a proper fit and proper immobilization with the brace.

## 2012-05-01 NOTE — Progress Notes (Signed)
Patient seen and examined. Agree with assessment and plan outlined in PAs note.

## 2012-05-01 NOTE — Progress Notes (Signed)
Physical medicine rehabilitation consult as been requested. Await full followup and completion of workup with orthopedic services and physical occupational therapy evaluations. Will provide a full rehabilitation consult once completed

## 2012-05-02 ENCOUNTER — Inpatient Hospital Stay (HOSPITAL_COMMUNITY): Payer: Medicare Other

## 2012-05-02 LAB — CBC
HCT: 32.8 % — ABNORMAL LOW (ref 36.0–46.0)
Hemoglobin: 10.6 g/dL — ABNORMAL LOW (ref 12.0–15.0)
MCH: 31.5 pg (ref 26.0–34.0)
MCHC: 32.3 g/dL (ref 30.0–36.0)
MCV: 97.3 fL (ref 78.0–100.0)

## 2012-05-02 LAB — BASIC METABOLIC PANEL
BUN: 20 mg/dL (ref 6–23)
CO2: 28 mEq/L (ref 19–32)
Chloride: 93 mEq/L — ABNORMAL LOW (ref 96–112)
Glucose, Bld: 121 mg/dL — ABNORMAL HIGH (ref 70–99)
Potassium: 3.8 mEq/L (ref 3.5–5.1)

## 2012-05-02 NOTE — Progress Notes (Signed)
Patient seen and examined. Blood pressure stable. Renal function has resolved. Continue holding her Lasix and ACE inhibitor for now. Medical consult will sign off for now. Please call for any  questions

## 2012-05-02 NOTE — Progress Notes (Signed)
Patient doing well, nausea resolved.  Pain has lessened dramatically.  BP 118/58  Pulse 58  Temp 97.9 F (36.6 C) (Oral)  Resp 16  Ht 5\' 2"  (1.575 m)  Wt 122.018 kg (269 lb)  BMI 49.20 kg/m2  SpO2 100%  Collar appropriately applied Patient comfortable NVI  Pt with C1/C2 fractures - being maintained in hard cervicothoracic orthosis for definitive treatment - will obtain additional lateral view to evaluate dens alignment - up ad lib - patient may transfer to rehab when bed available - additional medical management per medical consult - thoracic extension may be removed when patient is standing to change clothes and to bathe

## 2012-05-02 NOTE — Consult Note (Signed)
Physical Medicine and Rehabilitation Consult Reason for Consult: Odontoid fracture and range of C.-1 fracture Referring Physician: Dr. Yevette Edwards   HPI: Stacey Cooley is a 71 y.o. right-handed female with history of C4-7 anterior cervical decompression and fusion May of 2011. Patient was independent prior to admission living with her husband. Admitted 04/30/2012 with neck pain after a fall when she struck her head and hit a table in her home. The fall was unwitnessed. X-rays and imaging from outside hospital revealed nondisplaced type II dens fracture C1-2. Followup orthopedic services Dr. Yevette Edwards. Patient placed in a rigid cervicothoracic orthosis x3 months. No surgical intervention at this time. Noted blood alcohol level upon admission of 181 and monitored for any signs of alcohol withdrawal. Physical and occupational therapy evaluations are pending. M.D. is requested physical medicine rehabilitation consult to consider inpatient rehabilitation services   Review of Systems  Musculoskeletal: Positive for myalgias and joint pain.  Neurological: Positive for weakness.  Psychiatric/Behavioral: Positive for depression.  All other systems reviewed and are negative.   Past Medical History  Diagnosis Date  . HTN (hypertension)   . Morbid obesity   . DDD (degenerative disc disease), cervical   . Rheumatic fever     child  . Dyslipidemia   . Swelling of joint of lower leg     recurrent lower extremity leg swelling, noted by pt  . 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 Anemia   . Hypothyroid   . Renal insufficiency 09/06/2011  . Varicose vein of leg   . Uterine cancer 1996    s/p hysterectomy; patient reported low stage; did not need adjuvant chemo  or radiation.   Marland Kitchen MRSA (methicillin resistant staph aureus) culture positive   . TACHYCARDIA 10/17/2009    Qualifier:  Diagnosis of  By: Mariah Milling MD, Tim    . Complication of anesthesia    Past Surgical History  Procedure Date  . Anterior cervical decompression and fusion 09/2009    involving C4-7 levels as well as a posterior cervical fusion prcedure from C3 to T  . Tonsillectomy 1966  . Gastric stapling 12/1984 and 2001  . Abdominal hysterectomy 1996  . Right hip replacement 1997  . Right ulnar nerve decompression 2005  . Right carpal tunnel release 2005  . Total left knee replacement 2006   Family History  Problem Relation Age of Onset  . Coronary artery disease Neg Hx   . Cancer Mother     lung cancer  . Liver disease Father   . Anemia Sister    Social History:  reports that she quit smoking about 20 years ago. She does not have any smokeless tobacco history on file. She reports that she does not drink alcohol or use illicit drugs. Allergies:  Allergies  Allergen Reactions  . Latex Itching  . Penicillins Rash  . Sulfonamide Derivatives Rash   Medications Prior to Admission  Medication Sig Dispense Refill  . acetaminophen-codeine (TYLENOL #3) 300-30 MG per tablet Take 1 tablet by mouth 2 (two) times daily.       Marland Kitchen allopurinol (ZYLOPRIM) 300 MG tablet Take 300 mg by mouth daily.      Marland Kitchen aspirin 81 MG tablet Take 81 mg by mouth at bedtime.       . benazepril (LOTENSIN) 20 MG tablet Take 20 mg by mouth every morning.       Marland Kitchen  CELEBREX 100 MG capsule Take 100 mg by mouth every morning. May take an additional capsule at night if needed for pain      . citalopram (CELEXA) 20 MG tablet Take 20 mg by mouth daily.      . ferrous fumarate (FERRO-SEQUELS) 50 MG CR tablet Take 50 mg by mouth at bedtime.      . fluticasone (VERAMYST) 27.5 MCG/SPRAY nasal spray Place 2 sprays into the nose daily as needed. For nasal congestion      . levothyroxine (SYNTHROID, LEVOTHROID) 75 MCG tablet Take 75 mcg by mouth daily.        . metoprolol tartrate (LOPRESSOR) 25 MG tablet Take 25 mg by mouth 2 (two) times daily.         Marland Kitchen oxybutynin (DITROPAN) 5 MG tablet Take 5 mg by mouth Daily.      Marland Kitchen tiZANidine (ZANAFLEX) 2 MG tablet Take 2 mg by mouth 2 (two) times daily.         Home:    Functional History:   Functional Status:  Mobility:          ADL:    Cognition: Cognition Orientation Level: Oriented X4    Blood pressure 129/62, pulse 77, temperature 98.1 F (36.7 C), temperature source Oral, resp. rate 18, height 5\' 2"  (1.575 m), weight 122.018 kg (269 lb), SpO2 96.00%. Physical Exam  Vitals reviewed. Constitutional: She is oriented to person, place, and time.  HENT:  Head: Normocephalic.  Eyes:       Pupils round and reactive to light  Cardiovascular: Normal rate and regular rhythm.   Pulmonary/Chest: Effort normal and breath sounds normal. No respiratory distress.  Abdominal: Soft. Bowel sounds are normal. She exhibits no distension. There is no tenderness.  Neurological: She is alert and oriented to person, place, and time.  Skin:       Cervico transthoracic orthosis in place  Psychiatric: She has a normal mood and affect.    No results found for this or any previous visit (from the past 24 hour(s)). Dg Cervical Spine 2-3 Views  05/01/2012  *RADIOLOGY REPORT*  Clinical Data: Followup C1-2 fractures.  CERVICAL SPINE - 2-3 VIEW  Comparison: None.  Findings: Previous anterior cervical disc fusions are seen from levels of C4-C7, and posterior pedicle screws and fixation rods are seen extending from levels of C3-T1.  Fracture of the base of the odontoid process is seen with mild dorsal displacement and angulation.  Anterior arch of C1 also appears fractured, without evidence of atlantoaxial subluxation.  IMPRESSION:  1.  Odontoid fracture with mild dorsal displacement and angulation. 2.  Fracture of the anterior pressures C1, without evidence of atlantoaxial subluxation.   Original Report Authenticated By: Myles Rosenthal, M.D.     Assessment/Plan: Diagnosis: cervical fx.    Comment: Pt up  walking independently in the room. No therapy evals yet. Doubt she will need [place]. Defer consult   Ivory Broad, MD 05/02/2012

## 2012-05-02 NOTE — Progress Notes (Signed)
Utilization review completed. Porsche Noguchi, RN, BSN. 

## 2012-05-02 NOTE — Progress Notes (Addendum)
Pt HR dropped down to low 50's while asleep, after pt was given 2 Vicodin.  Pt asymptomatic and asleep with all other vitals WNL.  Will continue to monitor closely. NP notified, advised to monitor pt and re-notify if HR progressively declines.

## 2012-05-02 NOTE — Progress Notes (Signed)
Updated lateral radiograph of patient's cervical spine reviewed and does reveal increased angulation and displacement of odontoid.  Plan is to reposition neck into increased flexion and remove thoracic extension and repeat radiograph lateral today. If fracture can not be controlled better in hard collar, may need to place patient into halo vest for definitive treatment.  Will make pt NPO in case patient may need to be brought to operating room for reduction of dens and halo placement.

## 2012-05-02 NOTE — Progress Notes (Addendum)
[facility] Consult follow up Note   Stacey Cooley ZOX:[id number] DOB: [birth year] DOA: 04/30/2012 PCP: Yates Decamp, MD  Requesting Physician:  Dr. Jodi Geralds  Date of consultation: 04/30/2012  Reason for consultation: Hypotension and management of chronic medical problems  Will sign off as hypotension and dehydration have completely resolved.  Please call back if we can be of further assistance.  Assessment/Plan:  Principal Problem:  *Cervical spine fracture  - per primary team   Active Problems:   Hypotension, unspecified  - resolved. - likely multifactorial in etiology and secondary to dehydration, poor oral intake, use of antihypertensives  - Will restart metoprolol at half home dose. - Recommend adding back benazepril as soon as her BP warrants it.  (pulse is not high enough to add back full dose BB) - Continue IVF at 50 ml/hour as she is NPO.  When she is eating again saline lock fluids.  Alcohol abuse  - actively drinking prior to admission - on CIWA protocol and will monitor for signs of withdrawal but no signs of withdrawal at this point  - will also provide MVI, Thiamine and Folate  - social work consult for alcohol cessation   Acute kidney injury in setting of Renal insufficiency  - Completely resolved.  Creatinine 0.80 today. - secondary to Lasix.  Lasix was discontinued just prior to admission by PCP  Anemia  - Stable. - of chronic disease and with baseline Hg ~10.   - continue Iron per home medication regimen   Nausea  - Resolved. - possibly related to alcohol intoxication.   Lipase 40 (wnl) - on clear liquid diet  Hyperkalemia  - Resolved.  Hypothyroidism  - stable. - will continue Synthroid   Depression  - Stable. - continue Celexa   Code Status: full Family Communication: Disposition Plan: Per primary team   Consultants:  [facility]  Procedures:    Antibiotics:    HPI/Subjective: Having difficulty calling her  husband's cell phone.  Disappointed she can't have anything to drink (NPO).  No other complaints.  Objective: Filed Vitals:   05/02/12 0600 05/02/12 1410 05/02/12 2139 05/03/12 0551  BP: 118/58 121/65 151/58 146/96  Pulse: 58 61 82 81  Temp: 97.9 F (36.6 C) 97.8 F (36.6 C) 98.2 F (36.8 C) 97.8 F (36.6 C)  TempSrc:      Resp: 16 18 18 18   Height:      Weight:      SpO2: 100% 95% 95% 94%    Intake/Output Summary (Last 24 hours) at 05/03/12 1000 Last data filed at 05/03/12 0657  Gross per 24 hour  Intake    240 ml  Output      0 ml  Net    240 ml   Filed Weights   05/01/12 0013  Weight: 122.018 kg (269 lb)    Exam:   General:  Awake A&O, in firm cervical collar. Winces when she turns her head slightly  Cardiovascular: RRR no R/G, positive systolic murmur. 2/6  Respiratory: No W/C/R no accessory muscle use  Abdomen: Obese, NT, ND, +BS, no masses  Extremities:  Minimal bilateral lower extremity edema, slight erythema in left distal lower extremity.  Data Reviewed: Basic Metabolic Panel:  Lab 05/02/12 8119 05/01/12 0612 04/30/12 2144 04/26/12 1018  NA 131* 136 135 139  K 3.8 4.9 5.4* 5.3*  CL 93* 99 96 102  CO2 28 24 17* 26  GLUCOSE 121* 108* 63* 82  BUN 20 28* 31* 16.0  CREATININE 0.80  1.30* 1.74* 1.0  CALCIUM 8.2* 8.2* 8.3* 10.2  MG -- -- -- --  PHOS -- -- -- --   Liver Function Tests:  Lab 05/01/12 0612 04/30/12 2144 04/26/12 1018  AST 31 38* 39*  ALT 21 23 30   ALKPHOS 95 94 121  BILITOT 0.4 0.3 0.45  PROT 6.2 6.3 6.8  ALBUMIN 3.1* 3.2* 3.8    Lab 04/30/12 2144  LIPASE 40  AMYLASE --   CBC:  Lab 05/02/12 0955 05/01/12 0612 04/26/12 1018  WBC 6.7 10.4 6.0  NEUTROABS -- -- 2.8  HGB 10.6* 10.6* 11.1*  HCT 32.8* 32.5* 33.9*  MCV 97.3 95.0 98.0  PLT 150 194 215     Recent Results (from the past 240 hour(s))  SURGICAL PCR SCREEN     Status: Normal   Collection Time   05/01/12  3:07 AM      Component Value Range Status Comment    MRSA, PCR NEGATIVE  NEGATIVE Final    Staphylococcus aureus NEGATIVE  NEGATIVE Final      Studies: Dg Cervical Spine 1 View  05/02/2012  *RADIOLOGY REPORT*  Clinical Data: History of dens fracture, post repositioning of cervical collar  DG CERVICAL SPINE - 1 VIEW  Comparison: Cervical spine radiographs - earlier same day; 05/01/2012; cervical spine CT - 04/30/2012  Findings:  Previously noted dorsally displaced odontoid fracture is grossly unchanged with persistent dorsal displacement ranging between 7 and 9 mm.  No new comminuted fracture of the anterior ring of C1 are grossly unchanged.  Post lower cervical ACDF and paraspinal fusion again incompletely imaged.  Prevertebral soft tissues are unchanged.  IMPRESSION: Grossly unchanged appearance of known odontoid fracture with dorsal displacement ranging between 7 to 9 mm.   Original Report Authenticated By: Tacey Ruiz, MD    Dg Cervical Spine 1 View  05/02/2012  *RADIOLOGY REPORT*  Clinical Data: C2 fracture  DG CERVICAL SPINE - 1 VIEW  Comparison: Cervical spine radiographs - 05/01/2012; cervical spine CT - 04/30/2012  Findings:  C1 to the superior endplate of C6 is imaged.  There is suboptimal evaluation of C6 - C7 and the cervical thoracic junction secondary to overlying osseous to soft tissue structures.  The head is held in a minimal degree of extension.  There is apparent increased displacement of previously noted odontoid fracture, now with approximately 9 mm of dorsal displacement, previously, approximately 4 mm.  Osseous fragments adjacent to the inferior aspect of the anterior ring of C1 appear grossly unchanged though are incompletely evaluated.  Post ACDF C4 - C7 and bilateral paraspinal fusion of the C3 - T1, though note, the inferior aspect of the hardware is suboptimally evaluated secondary to overlying osseous and soft tissue structures.  Prevertebral soft tissues are normal.  IMPRESSION: 1.  Increased dorsal displacement of odontoid  fracture with the head held in a minimal degree of extension, worrisome for instability. 2.  Comminuted fracture of the anterior ring of C1, incompletely evaluated. 3.  Post ACDF and paraspinal fusion of the lower cervical spine, incompletely evaluated.   Original Report Authenticated By: Tacey Ruiz, MD     Scheduled Meds:    . allopurinol  300 mg Oral Daily  . antiseptic oral rinse  15 mL Mouth Rinse q12n4p  . aspirin  81 mg Oral QHS  . chlorhexidine  15 mL Mouth Rinse BID  . citalopram  20 mg Oral Daily  . ferrous fumarate  1 tablet Oral QHS  . folic acid  1  mg Oral Daily  . levothyroxine  75 mcg Oral QAC breakfast  . metoprolol tartrate  12.5 mg Oral BID  . oxybutynin  5 mg Oral BID  . sodium chloride  3 mL Intravenous Q12H  . thiamine  100 mg Intravenous Daily  . tiZANidine  2 mg Oral BID   Continuous Infusions:    Principal Problem:  *Cervical spine fracture Active Problems:  Hypotension, unspecified  Renal insufficiency  Anemia  Alcohol abuse  Nausea  Transaminitis  Hyperkalemia  Obesity    Time spent: 25 min.    Stephani Police  [facility] Pager [phone] If 8PM-8AM, please contact night-coverage at [email or web address], password [email or web address] 05/03/2012, 10:00 AM  LOS: 3 days    Addendum:  To add obesity to problem list.

## 2012-05-03 ENCOUNTER — Inpatient Hospital Stay (HOSPITAL_COMMUNITY): Payer: Medicare Other

## 2012-05-03 ENCOUNTER — Encounter (HOSPITAL_COMMUNITY): Payer: Self-pay | Admitting: Anesthesiology

## 2012-05-03 ENCOUNTER — Inpatient Hospital Stay (HOSPITAL_COMMUNITY): Payer: Medicare Other | Admitting: Anesthesiology

## 2012-05-03 ENCOUNTER — Encounter (HOSPITAL_COMMUNITY)
Admission: EM | Disposition: A | Discharge status: Skilled Nursing Facility | Payer: Self-pay | Source: Home / Self Care | Attending: Orthopedic Surgery

## 2012-05-03 DIAGNOSIS — E669 Obesity, unspecified: Secondary | ICD-10-CM | POA: Diagnosis present

## 2012-05-03 HISTORY — PX: HALO APPLICATION: SHX1720

## 2012-05-03 SURGERY — APPLICATION, HALO DEVICE, FOR SPINAL TRACTION
Anesthesia: General | Site: Head | Wound class: Clean Contaminated

## 2012-05-03 MED ORDER — OXYCODONE HCL 5 MG PO TABS: 5.0000 mg | ORAL_TABLET | Freq: Once | ORAL | Status: AC | PRN

## 2012-05-03 MED ORDER — PROPOFOL 10 MG/ML IV BOLUS
INTRAVENOUS | Status: DC | PRN
  Administered 2012-05-03: 100 mg via INTRAVENOUS

## 2012-05-03 MED ORDER — ONDANSETRON HCL 4 MG/2ML IJ SOLN
INTRAMUSCULAR | Status: DC | PRN
  Administered 2012-05-03: 4 mg via INTRAVENOUS

## 2012-05-03 MED ORDER — OXYCODONE HCL 5 MG/5ML PO SOLN: 5.0000 mg | Freq: Once | ORAL | Status: AC | PRN

## 2012-05-03 MED ORDER — HYDROMORPHONE HCL PF 1 MG/ML IJ SOLN
0.2500 mg | INTRAMUSCULAR | Status: DC | PRN
  Administered 2012-05-03: 0.5 mg via INTRAVENOUS

## 2012-05-03 MED ORDER — LIDOCAINE-EPINEPHRINE 1 %-1:100000 IJ SOLN
INTRAMUSCULAR | Status: DC | PRN
  Administered 2012-05-03: 5 mL

## 2012-05-03 MED ORDER — MEPERIDINE HCL 25 MG/ML IJ SOLN: 6.2500 mg | INTRAMUSCULAR | Status: DC | PRN

## 2012-05-03 MED ORDER — MIDAZOLAM HCL 5 MG/5ML IJ SOLN
INTRAMUSCULAR | Status: DC | PRN
  Administered 2012-05-03 (×2): 1 mg via INTRAVENOUS

## 2012-05-03 MED ORDER — LACTATED RINGERS IV SOLN
INTRAVENOUS | Status: DC | PRN
  Administered 2012-05-03 (×2): via INTRAVENOUS

## 2012-05-03 MED ORDER — ONDANSETRON HCL 4 MG/2ML IJ SOLN: 4.0000 mg | Freq: Once | INTRAMUSCULAR | Status: AC | PRN

## 2012-05-03 MED ORDER — FENTANYL CITRATE 0.05 MG/ML IJ SOLN
INTRAMUSCULAR | Status: DC | PRN
  Administered 2012-05-03: 100 ug via INTRAVENOUS
  Administered 2012-05-03: 50 ug via INTRAVENOUS
  Administered 2012-05-03: 100 ug via INTRAVENOUS

## 2012-05-03 SURGICAL SUPPLY — 15 items
CLOTH BEACON ORANGE TIMEOUT ST (SAFETY) ×2 IMPLANT
COVER TABLE BACK 60X90 (DRAPES) IMPLANT
DECANTER SPIKE VIAL GLASS SM (MISCELLANEOUS) IMPLANT
GAUZE SPONGE 4X4 16PLY XRAY LF (GAUZE/BANDAGES/DRESSINGS) ×2 IMPLANT
GLOVE BIO SURGEON STRL SZ8 (GLOVE) IMPLANT
GLOVE BIOGEL PI IND STRL 8 (GLOVE) IMPLANT
GLOVE BIOGEL PI INDICATOR 8 (GLOVE)
GOWN BRE IMP SLV AUR LG STRL (GOWN DISPOSABLE) IMPLANT
GOWN BRE IMP SLV AUR XL STRL (GOWN DISPOSABLE) IMPLANT
KIT ROOM TURNOVER OR (KITS) ×2 IMPLANT
NEEDLE HYPO 25X1 1.5 SAFETY (NEEDLE) ×2 IMPLANT
PAD ARMBOARD 7.5X6 YLW CONV (MISCELLANEOUS) ×6 IMPLANT
SYR CONTROL 10ML LL (SYRINGE) ×2 IMPLANT
TOWEL OR 17X24 6PK STRL BLUE (TOWEL DISPOSABLE) ×2 IMPLANT
TOWEL OR 17X26 10 PK STRL BLUE (TOWEL DISPOSABLE) ×2 IMPLANT

## 2012-05-03 NOTE — Preoperative (Signed)
Beta Blockers   Reason not to administer Beta Blockers:Not Applicable, took metoprolol 05/03/12 @ 0959 per [facility]

## 2012-05-03 NOTE — Anesthesia Postprocedure Evaluation (Signed)
Anesthesia Post Note  Patient: Engineer, drilling  Procedure(s) Performed: Procedure(s) (LRB): HALO TRACTION APPLICATION (N/A)  Anesthesia type: general  Patient location: PACU  Post pain: Pain level controlled  Post assessment: Patient's Cardiovascular Status Stable  Last Vitals:  Filed Vitals:   05/03/12 2311  BP: 165/82  Pulse: 78  Temp:   Resp: 11    Post vital signs: Reviewed and stable  Level of consciousness: sedated  Complications: No apparent anesthesia complications

## 2012-05-03 NOTE — Transfer of Care (Signed)
Immediate Anesthesia Transfer of Care Note  Patient: Engineer, drilling  Procedure(s) Performed: Procedure(s) (LRB) with comments: HALO TRACTION APPLICATION (N/A)  Patient Location: PACU  Anesthesia Type:General  Level of Consciousness: awake, alert  and oriented  Airway & Oxygen Therapy: Patient Spontanous Breathing and Patient connected to nasal cannula oxygen  Post-op Assessment: Report given to PACU RN and Post -op Vital signs reviewed and stable  Post vital signs: Reviewed and stable  Complications: No apparent anesthesia complications

## 2012-05-03 NOTE — Anesthesia Procedure Notes (Addendum)
Procedure Name: Awake intubation Date/Time: 05/03/2012 9:33 PM Performed by: Garen Lah Pre-anesthesia Checklist: Patient identified, Emergency Drugs available, Suction available and Patient being monitored Patient Re-evaluated:Patient Re-evaluated prior to inductionOxygen Delivery Method: Circle system utilized Preoxygenation: Pre-oxygenation with 100% oxygen Intubation Type: IV induction Nasal Tubes: Right and Nasal prep performed Tube size: 7.0 mm Number of attempts: 1 Airway Equipment and Method: Fiberoptic brochoscope Placement Confirmation: ETT inserted through vocal cords under direct vision,  breath sounds checked- equal and bilateral and positive ETCO2 Secured at: 28 cm Tube secured with: Tape Comments: Pt had a fracture of C1 and C2 needing an awake fibreoptic intubation with neuro checks post intubation. Bilateral suprahyoid blocks performed with 2% xylocaine. Transtrachial block with 2% xylocaine. Topical 4% cocaine to nose. Dilated with #8 and #8.5 nasal airways.  Nasal #7 ETT inserted into trachea using a fibreoptic brochoscope. Good bilat BS=. Pt had good hand and foot movement bilaterally. Anesthesia induced.  No problems. Arta Bruce MD    I personally performed the fibreoptic intubation.  Arta Bruce MD

## 2012-05-03 NOTE — Anesthesia Preprocedure Evaluation (Signed)
Anesthesia Evaluation  Patient identified by MRN, date of birth, ID band Patient awake    Reviewed: Allergy & Precautions, H&P , NPO status , Patient's Chart, lab work & pertinent test results, reviewed documented beta blocker date and time   History of Anesthesia Complications Negative for: history of anesthetic complications  Airway Mallampati: III      Dental  (+) Dental Advisory Given   Pulmonary former smoker,          Cardiovascular hypertension, Pt. on medications and Pt. on home beta blockers + Peripheral Vascular Disease     Neuro/Psych    GI/Hepatic   Endo/Other  Hypothyroidism Morbid obesity  Renal/GU Renal InsufficiencyRenal disease     Musculoskeletal   Abdominal (+) + obese,   Peds  Hematology   Anesthesia Other Findings   Reproductive/Obstetrics                           Anesthesia Physical Anesthesia Plan  ASA: III  Anesthesia Plan: General   Post-op Pain Management:    Induction: Intravenous  Airway Management Planned: Oral ETT  Additional Equipment:   Intra-op Plan:   Post-operative Plan: Extubation in OR  Informed Consent: I have reviewed the patients History and Physical, chart, labs and discussed the procedure including the risks, benefits and alternatives for the proposed anesthesia with the patient or authorized representative who has indicated his/her understanding and acceptance.   Dental advisory given  Plan Discussed with: CRNA and Anesthesiologist  Anesthesia Plan Comments:         Anesthesia Quick Evaluation

## 2012-05-04 ENCOUNTER — Inpatient Hospital Stay (HOSPITAL_COMMUNITY): Payer: Medicare Other

## 2012-05-04 MED ORDER — BISACODYL 5 MG PO TBEC: 5.0000 mg | DELAYED_RELEASE_TABLET | Freq: Every day | ORAL | Status: DC | PRN

## 2012-05-04 MED ORDER — MENTHOL 3 MG MT LOZG: 1.0000 | LOZENGE | OROMUCOSAL | Status: DC | PRN

## 2012-05-04 MED ORDER — SODIUM CHLORIDE 0.9 % IJ SOLN
3.0000 mL | Freq: Two times a day (BID) | INTRAMUSCULAR | Status: DC
  Administered 2012-05-04 – 2012-05-05 (×2): 3 mL via INTRAVENOUS

## 2012-05-04 MED ORDER — SODIUM CHLORIDE 0.9 % IJ SOLN: 3.0000 mL | INTRAMUSCULAR | Status: DC | PRN

## 2012-05-04 MED ORDER — CYCLOBENZAPRINE HCL 10 MG PO TABS
10.0000 mg | ORAL_TABLET | Freq: Three times a day (TID) | ORAL | Status: DC | PRN
  Administered 2012-05-04 – 2012-05-08 (×9): 10 mg via ORAL
  Filled 2012-05-04 (×9): qty 1

## 2012-05-04 MED ORDER — OXYCODONE-ACETAMINOPHEN 5-325 MG PO TABS
1.0000 | ORAL_TABLET | ORAL | Status: DC | PRN
  Administered 2012-05-04 – 2012-05-06 (×8): 2 via ORAL
  Filled 2012-05-04 (×9): qty 2

## 2012-05-04 MED ORDER — MORPHINE SULFATE 2 MG/ML IJ SOLN
1.0000 mg | INTRAMUSCULAR | Status: DC | PRN
  Administered 2012-05-04 – 2012-05-05 (×6): 2 mg via INTRAVENOUS
  Filled 2012-05-04 (×7): qty 1

## 2012-05-04 MED ORDER — FLEET ENEMA 7-19 GM/118ML RE ENEM: 1.0000 | ENEMA | Freq: Once | RECTAL | Status: AC | PRN

## 2012-05-04 MED ORDER — ZOLPIDEM TARTRATE 5 MG PO TABS: 5.0000 mg | ORAL_TABLET | Freq: Every evening | ORAL | Status: DC | PRN

## 2012-05-04 MED ORDER — SENNOSIDES-DOCUSATE SODIUM 8.6-50 MG PO TABS
1.0000 | ORAL_TABLET | Freq: Every evening | ORAL | Status: DC | PRN
  Administered 2012-05-04: 1 via ORAL

## 2012-05-04 MED ORDER — ACETAMINOPHEN 650 MG RE SUPP: 650.0000 mg | RECTAL | Status: DC | PRN

## 2012-05-04 MED ORDER — PHENOL 1.4 % MT LIQD: 1.0000 | OROMUCOSAL | Status: DC | PRN

## 2012-05-04 MED ORDER — SODIUM CHLORIDE 0.9 % IV SOLN: 250.0000 mL | INTRAVENOUS | Status: DC

## 2012-05-04 MED ORDER — CEFAZOLIN SODIUM 1-5 GM-% IV SOLN
1.0000 g | Freq: Three times a day (TID) | INTRAVENOUS | Status: AC
  Administered 2012-05-04: 1 g via INTRAVENOUS
  Filled 2012-05-04 (×2): qty 50

## 2012-05-04 MED ORDER — DOCUSATE SODIUM 100 MG PO CAPS
100.0000 mg | ORAL_CAPSULE | Freq: Two times a day (BID) | ORAL | Status: DC
  Administered 2012-05-04 – 2012-05-08 (×8): 100 mg via ORAL
  Filled 2012-05-04 (×10): qty 1

## 2012-05-04 MED ORDER — ACETAMINOPHEN 325 MG PO TABS: 650.0000 mg | ORAL_TABLET | ORAL | Status: DC | PRN

## 2012-05-04 MED ORDER — ONDANSETRON HCL 4 MG/2ML IJ SOLN: 4.0000 mg | INTRAMUSCULAR | Status: DC | PRN

## 2012-05-04 MED ORDER — VITAMIN B-1 100 MG PO TABS
100.0000 mg | ORAL_TABLET | Freq: Every day | ORAL | Status: DC
  Administered 2012-05-05 – 2012-05-08 (×4): 100 mg via ORAL
  Filled 2012-05-04 (×4): qty 1

## 2012-05-04 MED ORDER — HYDROCODONE-ACETAMINOPHEN 5-325 MG PO TABS
1.0000 | ORAL_TABLET | ORAL | Status: DC | PRN
  Administered 2012-05-06 – 2012-05-08 (×10): 2 via ORAL
  Filled 2012-05-04 (×10): qty 2

## 2012-05-04 MED FILL — Cocaine HCl Soln 4%: CUTANEOUS | Qty: 4 | Status: AC

## 2012-05-04 NOTE — Op Note (Signed)
NAME:  Stacey Cooley, Stacey Cooley NO.:  [id number]  MEDICAL RECORD NO.:  [id number]  LOCATION:  [id number]                        FACILITY:  [facility]  PHYSICIAN:  Estill Bamberg, MD      DATE OF BIRTH:  08-03-1940  DATE OF PROCEDURE:  05/03/2012 DATE OF DISCHARGE:                              OPERATIVE REPORT   PREOPERATIVE DIAGNOSIS:  C1, C2 fracture.  POSTOPERATIVE DIAGNOSIS:  C1, C2 fracture.  PROCEDURE: 1. Application of halo and vest. 2. Closed reduction of C1 fracture. 3. Closed reduction of C2, type 2 odontoid fracture. 4. Intraoperative use of fluoroscopy.  SURGEON:  Estill Bamberg, MD  ASSISTANT:  Jason Coop, PA-C  ANESTHESIA:  General endotracheal anesthesia.  COMPLICATIONS:  None.  DISPOSITION:  Stable.  INDICATIONS FOR PROCEDURE:  Briefly, [name] is a very pleasant 71- year-old female, who last week sustained a C2 and C1 fracture.  Of note, the patient is status post a C3-T1 fusion by me.  I did attempt to mobilize the patient in a hard cervical collar, however, the alignment of the fracture fragments was less than ideal.  I therefore did feel that application of halo vest with closed reduction in the operating room would be the most appropriate course of action.  The patient was therefore brought to surgery on May 03, 2012 for the procedure.  OPERATIVE TECHNIQUE:  On May 03, 2012, the patient was brought to surgery and general endotracheal anesthesia was administered.  Of note, the neck was not extended for the intubation.  I then applied the halo in the usual fashion.  Two pins were placed at the lateral aspect of the eyebrow, approximately 1 cm above the eyebrow.  I then placed 2 pins posteriorly as well.  Alcohol swabs were used.  The vest was then placed in the usual fashion as well.  I then obtained a lateral intraoperative fluoroscopic image.  I then obtained a lateral intraoperative fluoroscopic lateral image which did continue to  reveal posterior displacement of the dens.  I therefore did apply traction, anterior translation and flexion across the fracture site.  I then obtained an additional image and was very pleased with the reduction, as the odontoid fragment did transit more anteriorly and did assume a more flexed posture in line with the patient's anatomy.  I then removed the distraction and locked the halo into its final position.  An additional lateral image was obtained and did again reveal appropriate anatomic position of the dens fracture and of the C1 fracture.  At this point, the patient was awoken from general endotracheal anesthesia and transferred to recovery in stable disposition by me.  Of note, Jason Coop was my assistant throughout the procedure.  Of note, the postoperative plan is to obtain a CAT scan in the morning of postoperative day #1 to ensure appropriate alignment.  The definitive plan is to go forward with halo treatment for approximately 3 months.     Estill Bamberg, MD     MD/MEDQ  D:  05/03/2012  T:  05/04/2012  Job:  [id number]

## 2012-05-04 NOTE — Progress Notes (Signed)
Discussed with Dr. Riley Kill this morning. He saw patient preop on 12/10 and pt walking independently ir room and consult deferred preop. I will await therapy evaluations and then discuss again with Dr. Riley Kill. May need SNF if Halo can not be managed at home. [phone]

## 2012-05-04 NOTE — Progress Notes (Addendum)
Rehab Admissions Coordinator Note:  Patient was screened by Clois Dupes for appropriateness for an Inpatient Acute Rehab Consult. Noted PT and OT evals. I met with patient at bedside. I will see pt tomorrow when her spouse returns and will discuss with Dr. Riley Kill .  Clois Dupes, RN 05/04/2012, 3:20 PM  I can be reached at [phone].

## 2012-05-04 NOTE — Evaluation (Signed)
Physical Therapy Evaluation Patient Details Name: Stacey Cooley MRN: [id number] DOB: Jun 08, 1940 Today's Date: 05/04/2012 Time: 1127-1201 PT Time Calculation (min): 34 min  PT Assessment / Plan / Recommendation Clinical Impression  Pt  s/p fall with C1-2 fracture and now s/p halo with vest stabilization. Pt presenting with functional weakness, impaired balance and safety. Pt will benefit from skilled PT in the acute care setting in order to maximize functional mobility and safety.     PT Assessment  Patient needs continued PT services    Follow Up Recommendations  CIR    Does the patient have the potential to tolerate intense rehabilitation      Barriers to Discharge        Equipment Recommendations  Rolling walker with 5" wheels    Recommendations for Other Services     Frequency Min 5X/week    Precautions / Restrictions Precautions Precautions: Cervical Required Braces or Orthoses: Other Brace/Splint Other Brace/Splint: Halo Restrictions Weight Bearing Restrictions: No   Pertinent Vitals/Pain Pain 5/10 in R neck. RN aware.       Mobility  Bed Mobility Bed Mobility: Supine to Sit Supine to Sit: 1: +2 Total assist;HOB elevated;With rails Supine to Sit: Patient Percentage: 80% Details for Bed Mobility Assistance: Upon first sitting pt c/o pain at back on right side of neck and right front of head. When raising the HOB up and lowering down it works better for the patient to pull forward with both hands using lower rails to take the shearing pressure off of the patients halo Transfers Transfers: Sit to Stand;Stand to Sit Sit to Stand: 1: +2 Total assist;With upper extremity assist;From bed Sit to Stand: Patient Percentage: 80% Stand to Sit: 1: +2 Total assist;With upper extremity assist;With armrests;To chair/3-in-1 Stand to Sit: Patient Percentage: 80% Details for Transfer Assistance: VCs for hand placement for safety with sit to stand and stand to  sit Ambulation/Gait Ambulation/Gait Assistance: 1: +2 Total assist Ambulation/Gait: Patient Percentage: 80% Ambulation Distance (Feet): 5 Feet Assistive device: 2 person hand held assist Ambulation/Gait Assistance Details: 2 person HHA for support, will attempt RW next session. Pt with increased pain with mobility and pressure in neck, RN aware.  Gait Pattern: Step-to pattern;Wide base of support Gait velocity: slow gait speed    Shoulder Instructions     Exercises     PT Diagnosis: Difficulty walking;Acute pain  PT Problem List: Decreased strength;Decreased activity tolerance;Decreased balance;Decreased mobility;Decreased knowledge of use of DME;Decreased safety awareness;Decreased knowledge of precautions;Pain PT Treatment Interventions: DME instruction;Gait training;Functional mobility training;Therapeutic activities;Balance training;Patient/family education   PT Goals Acute Rehab PT Goals PT Goal Formulation: With patient Time For Goal Achievement: 05/18/12 Potential to Achieve Goals: Good Pt will go Supine/Side to Sit: with supervision PT Goal: Supine/Side to Sit - Progress: Goal set today Pt will go Sit to Supine/Side: with supervision PT Goal: Sit to Supine/Side - Progress: Goal set today Pt will go Sit to Stand: with supervision PT Goal: Sit to Stand - Progress: Goal set today Pt will go Stand to Sit: with supervision PT Goal: Stand to Sit - Progress: Goal set today Pt will Transfer Bed to Chair/Chair to Bed: with supervision PT Transfer Goal: Bed to Chair/Chair to Bed - Progress: Goal set today Pt will Ambulate: >150 feet;with supervision;with least restrictive assistive device PT Goal: Ambulate - Progress: Goal set today  Visit Information  Last PT Received On: 05/04/12 Assistance Needed: +2 PT/OT Co-Evaluation/Treatment: Yes    Subjective Data      Prior Functioning  Home Living Lives With: Spouse Available Help at Discharge: Family;Available 24  hours/day Type of Home: House Home Access: Stairs to enter [employer] of Steps: 1 Entrance Stairs-Rails: None Home Layout: One level Bathroom Shower/Tub: Walk-in shower;Door [employer] Toilet: Handicapped height Bathroom Accessibility: Yes How Accessible: Accessible via walker Home Adaptive Equipment: Walker - rolling;Straight cane;Built-in shower seat Prior Function Level of Independence: Independent Able to Take Stairs?: Yes Driving: Yes Vocation: Retired Musician: No difficulties Dominant Hand: Right    Cognition  Overall Cognitive Status: Appears within functional limits for tasks assessed/performed Arousal/Alertness: Awake/alert Orientation Level: Appears intact for tasks assessed Behavior During Session: [facility] for tasks performed    Extremity/Trunk Assessment Right Upper Extremity Assessment RUE ROM/Strength/Tone: Within functional levels (up to 90 degrees of shoulder flexion due to cervical precaut) Left Upper Extremity Assessment LUE ROM/Strength/Tone: Within functional levels (up to 90 degrees of shoulder flexion due to cervical precaut) Right Lower Extremity Assessment RLE ROM/Strength/Tone: Within functional levels RLE Sensation: WFL - Light Touch Left Lower Extremity Assessment LLE ROM/Strength/Tone: Within functional levels LLE Sensation: WFL - Light Touch   Balance    End of Session PT - End of Session Activity Tolerance: Patient limited by pain Patient left: in chair;with call bell/phone within reach Nurse Communication: Mobility status  GP     Milana Kidney 05/04/2012, 3:15 PM  05/04/2012 Milana Kidney DPT PAGER: [phone] OFFICE: [phone]

## 2012-05-04 NOTE — Progress Notes (Signed)
Patient comfortable in halo. Pain at right upper trapezial region with sitting up.  BP 169/59  Pulse 76  Temp 98.2 F (36.8 C) (Oral)  Resp 18  Ht 5\' 2"  (1.575 m)  Wt 122.018 kg (269 lb)  BMI 49.20 kg/m2  SpO2 95%  NVI Halo in place Patient sat up to eat breakfast  POD #1 after C1 and C2 reduction and halo placement - PT/OT, rehab consult - will obtain CT this AM to evaluate precise reduction - pain control - d/c foley this AM - clean pin sites with 50% H2O2 BID

## 2012-05-04 NOTE — Evaluation (Signed)
Occupational Therapy Evaluation Patient Details Name: Stacey Cooley MRN: [id number] DOB: 1940/10/25 Today's Date: 05/04/2012 Time: 1123-1200 OT Time Calculation (min): 37 min  OT Assessment / Plan / Recommendation Clinical Impression  This 71 yo female s/p fall with C1-2 fracture and now s/p halo with vest stabilization presents to acute OT with problems below. Will benefit from acute OT with follow up OT on CIR    OT Assessment  Patient needs continued OT Services    Follow Up Recommendations  CIR    Barriers to Discharge None    Equipment Recommendations  3 in 1 bedside comode       Frequency  Min 3X/week    Precautions / Restrictions Precautions Precautions: Cervical Required Braces or Orthoses: Other Brace/Splint Other Brace/Splint: Halo Restrictions Weight Bearing Restrictions: No   Pertinent Vitals/Pain 3/10 at rest, 8/10 when up to EOB    ADL  Eating/Feeding: Simulated;Modified independent Where Assessed - Eating/Feeding: Chair Grooming: Simulated;Set up Where Assessed - Grooming: Supported sitting Upper Body Bathing: Simulated;Moderate assistance Where Assessed - Upper Body Bathing: Supported sitting Lower Body Bathing: Simulated;+1 Total assistance Where Assessed - Lower Body Bathing: Supported sit to stand Upper Body Dressing: Performed;+1 Total assistance Where Assessed - Upper Body Dressing: Supine, head of bed up (gown pushed under her halo vest) Lower Body Dressing: Simulated;+1 Total assistance Where Assessed - Lower Body Dressing: Supported sit to Pharmacist, hospital: Simulated;+2 Total assistance Toilet Transfer: Patient Percentage: 80% Statistician Method: Sit to Barista:  (Bed 5 steps over to recliner) Toileting - Clothing Manipulation and Hygiene: Simulated;+1 Total assistance Where Assessed - Glass blower/designer Manipulation and Hygiene: Standing Equipment Used:  (Halo with vest) Transfers/Ambulation Related to  ADLs: total A +2 sit to stand and stand to sit as well as with ambulation--(pt=80%)    OT Diagnosis: Generalized weakness;Acute pain  OT Problem List: Decreased range of motion;Impaired balance (sitting and/or standing);Decreased knowledge of use of DME or AE;Decreased knowledge of precautions;Pain OT Treatment Interventions: Self-care/ADL training;DME and/or AE instruction;Patient/family education;Balance training   OT Goals Acute Rehab OT Goals OT Goal Formulation: With patient Time For Goal Achievement: 05/11/12 Potential to Achieve Goals: Good ADL Goals Pt Will Perform Grooming: with supervision;Unsupported;Standing at sink ADL Goal: Grooming - Progress: Goal set today Pt Will Perform Upper Body Bathing: with min assist;Unsupported;Standing at sink;Sitting at sink ADL Goal: Upper Body Bathing - Progress: Goal set today Pt Will Perform Lower Body Bathing: with min assist;Unsupported;with adaptive equipment;Sitting at sink;Standing at sink ADL Goal: Lower Body Bathing - Progress: Goal set today Pt Will Perform Upper Body Dressing: with mod assist;Unsupported;Sitting, chair;Sitting, bed ADL Goal: Upper Body Dressing - Progress: Goal set today Pt Will Perform Lower Body Dressing: with min assist;Sit to stand from bed;Sit to stand from chair;Unsupported;with adaptive equipment ADL Goal: Lower Body Dressing - Progress: Goal set today Pt Will Transfer to Toilet: with supervision;Ambulation;with DME;Comfort height toilet;Grab bars ADL Goal: Toilet Transfer - Progress: Goal set today Pt Will Perform Toileting - Clothing Manipulation: with modified independence;Standing ADL Goal: Toileting - Clothing Manipulation - Progress: Goal set today Pt Will Perform Toileting - Hygiene: with min assist;with adaptive equipment;Sit to stand from 3-in-1/toilet ADL Goal: Toileting - Hygiene - Progress: Goal set today Miscellaneous OT Goals Miscellaneous OT Goal #1: Pt will be Mod I in and OOB for BADLs with  rails and HOB up prn OT Goal: Miscellaneous Goal #1 - Progress: Goal set today  Visit Information  Last OT Received On: 05/04/12 Assistance Needed: +2  PT/OT Co-Evaluation/Treatment: Yes    Subjective Data  Subjective: I am going to have to learn to manage with this halo Patient Stated Goal: Did not ask   Prior Functioning     Home Living Lives With: Spouse Available Help at Discharge: Family;Available 24 hours/day Type of Home: House Home Access: Stairs to enter [employer] of Steps: 1 Entrance Stairs-Rails: None Home Layout: One level Bathroom Shower/Tub: Walk-in shower;Door [employer] Toilet: Handicapped height Bathroom Accessibility: Yes How Accessible: Accessible via walker Home Adaptive Equipment: Walker - rolling;Straight cane;Built-in shower seat Prior Function Level of Independence: Independent Able to Take Stairs?: Yes Driving: Yes Vocation: Retired Comments: uses cane outside of house Communication Communication: No difficulties Dominant Hand: Right            Cognition  Overall Cognitive Status: Appears within functional limits for tasks assessed/performed Arousal/Alertness: Awake/alert Orientation Level: Appears intact for tasks assessed Behavior During Session: [facility] for tasks performed    Extremity/Trunk Assessment Right Upper Extremity Assessment RUE ROM/Strength/Tone: Within functional levels (up to 90 degrees of shoulder flexion due to cervical precautions) Left Upper Extremity Assessment LUE ROM/Strength/Tone: Within functional levels (up to 90 degrees of shoulder flexion due to cervical precautions)     Mobility Bed Mobility Bed Mobility: Supine to Sit Supine to Sit: 1: +2 Total assist;HOB elevated;With rails Supine to Sit: Patient Percentage: 80% Details for Bed Mobility Assistance: Upon first sitting pt c/o pain at back on right side of neck and right front of head. When raising the HOB up and lowering down it works better for  the patient to pull forward with both hands using lower rails to take the shearing pressure off of the patients halo Transfers Transfers: Sit to Stand;Stand to Sit Sit to Stand: 1: +2 Total assist;With upper extremity assist;From bed Sit to Stand: Patient Percentage: 80% Stand to Sit: 1: +2 Total assist;With upper extremity assist;With armrests;To chair/3-in-1 Stand to Sit: Patient Percentage: 80% Details for Transfer Assistance: VCs for hand placement for safety with sit to stand and stand to sit              End of Session OT - End of Session Equipment Utilized During Treatment:  (Halo with vest) Activity Tolerance: Patient tolerated treatment well Patient left: in chair;with call bell/phone within reach Nurse Communication: Mobility status (and how to adjust the [facility] with decreased pt pain)       Evette Georges [phone] 05/04/2012, 12:51 PM

## 2012-05-05 ENCOUNTER — Encounter (HOSPITAL_COMMUNITY): Payer: Self-pay | Admitting: Orthopedic Surgery

## 2012-05-05 ENCOUNTER — Inpatient Hospital Stay (HOSPITAL_COMMUNITY): Payer: Medicare Other

## 2012-05-05 NOTE — Progress Notes (Signed)
I met with patient and then contacted her spouse by phone. We can not justify an inpatient acute rehab admission for pt without neurological deficits. Functional deficits related to Halo. Rehab venue dependent on care husband feels he can coordinate at home. He and their son coming in this morning to discuss care needs and make plans. I have discussed with RN CM also. [phone]

## 2012-05-05 NOTE — Progress Notes (Signed)
Occupational Therapy Treatment Patient Details Name: Stacey Cooley MRN: [id number] DOB: 02-15-1941 Today's Date: 05/05/2012 Time: 4098-1191 OT Time Calculation (min): 29 min  OT Assessment / Plan / Recommendation Comments on Treatment Session Pt progressing well this session with therapy. Pt c/o pain in Lt posterior aspect of Halo screw hurting. Pt demonstrates unsafe use of RW and hand placement. Pt is a moderate fall risk and could benefit from continued therapy.    Follow Up Recommendations  CIR    Barriers to Discharge       Equipment Recommendations  3 in 1 bedside comode    Recommendations for Other Services    Frequency Min 3X/week   Plan Discharge plan remains appropriate    Precautions / Restrictions Precautions Precautions: Cervical Required Braces or Orthoses: Other Brace/Splint Other Brace/Splint: Halo Restrictions Weight Bearing Restrictions: No   Pertinent Vitals/Pain C/o pain at left halo screw posterior aspect Pt repositioned in chair    ADL  Eating/Feeding: Minimal assistance Where Assessed - Eating/Feeding: Chair (difficulty reaching, spilling containers) Grooming: Wash/dry face;Set up Where Assessed - Grooming: Supported sitting (difficulty navigating around halo bars ) Lower Body Bathing: +1 Total assistance (unable to reach could benefit from toilet aide) Where Assessed - Lower Body Bathing: Supported standing Upper Body Dressing: +1 Total assistance ((A) to dress around halo bars) Where Assessed - Upper Body Dressing: Unsupported standing Lower Body Dressing: +1 Total assistance Where Assessed - Lower Body Dressing: Supported standing Toilet Transfer: Minimal assistance Toilet Transfer Method: Sit to Barista: Regular height toilet;Grab bars (poor hand placement- pulling up on RW) Toileting - Clothing Manipulation and Hygiene: +1 Total assistance Where Assessed - Toileting Clothing Manipulation and Hygiene: Sit to stand  from 3-in-1 or toilet Equipment Used: Rolling walker Transfers/Ambulation Related to ADLs: pt ambulated to restroom Min guard (A), pt slightly impulsive with sit<>stand. Pt pulling up on RW. Pt could benefit from OT to decrease fall risk with RW ADL Comments: Pt requesting transfer to restroom on arrival. Pt completed toilet transfer and required (A) for all hygiene. pt explains "I can't reach it from the front or the back". Pt provided hygiene LB bath and don underwear. Pt positioned with breakfast tray. Pt required contrainers (A) to open and setup to self feed. Pt with oatmeal and toast which are easier for the patient to hold. Lids were placed on drinks to decrease the risk of spillage.    OT Diagnosis:    OT Problem List:   OT Treatment Interventions:     OT Goals Acute Rehab OT Goals OT Goal Formulation: With patient Time For Goal Achievement: 05/11/12 Potential to Achieve Goals: Good ADL Goals Pt Will Perform Grooming: with supervision;Unsupported;Standing at sink ADL Goal: Grooming - Progress: Progressing toward goals Pt Will Perform Upper Body Bathing: with min assist;Unsupported;Standing at sink;Sitting at sink ADL Goal: Upper Body Bathing - Progress: Progressing toward goals Pt Will Perform Lower Body Bathing: with min assist;Unsupported;with adaptive equipment;Sitting at sink;Standing at sink ADL Goal: Lower Body Bathing - Progress: Progressing toward goals Pt Will Perform Upper Body Dressing: with mod assist;Unsupported;Sitting, chair;Sitting, bed ADL Goal: Upper Body Dressing - Progress: Progressing toward goals Pt Will Perform Lower Body Dressing: with min assist;Sit to stand from bed;Sit to stand from chair;Unsupported;with adaptive equipment ADL Goal: Lower Body Dressing - Progress: Progressing toward goals Pt Will Transfer to Toilet: with supervision;Ambulation;with DME;Comfort height toilet;Grab bars ADL Goal: Toilet Transfer - Progress: Met Pt Will Perform Toileting -  Clothing Manipulation: with modified independence;Standing ADL  Goal: Toileting - Clothing Manipulation - Progress: Met Pt Will Perform Toileting - Hygiene: with min assist;with adaptive equipment;Sit to stand from 3-in-1/toilet ADL Goal: Toileting - Hygiene - Progress: Progressing toward goals Miscellaneous OT Goals Miscellaneous OT Goal #1: Pt will be Mod I in and OOB for BADLs with rails and HOB up prn OT Goal: Miscellaneous Goal #1 - Progress: Progressing toward goals  Visit Information  Last OT Received On: 05/05/12 Assistance Needed: +1 PT/OT Co-Evaluation/Treatment: Yes    Subjective Data      Prior Functioning       Cognition  Overall Cognitive Status: Appears within functional limits for tasks assessed/performed Arousal/Alertness: Awake/alert Orientation Level: Appears intact for tasks assessed Behavior During Session: [facility] for tasks performed    Mobility  Shoulder Instructions Bed Mobility Supine to Sit: 3: Mod assist;With rails;HOB elevated Details for Bed Mobility Assistance: pt pulling on Rt bed rail and utilizing ab muscles to lift trunk off bed surface. pt needed (A) to complete supine<>Sit transfer and to turn toward EOB Transfers Transfers: Sit to Stand;Stand to Sit Sit to Stand: 4: Min assist;With upper extremity assist;From bed Stand to Sit: 4: Min assist;With upper extremity assist;To chair/3-in-1 (mod v/c for hand placement) Details for Transfer Assistance: pt with poor hand placement and v/c for sequence for safety       Exercises      Balance     End of Session OT - End of Session Activity Tolerance: Patient tolerated treatment well Patient left: in chair;with call bell/phone within reach Nurse Communication: Mobility status  GO     Harrel Carina [facility] 05/05/2012, 8:52 AM Pager: [phone]

## 2012-05-05 NOTE — Progress Notes (Signed)
Physical Therapy Treatment Patient Details Name: Stacey Cooley MRN: [id number] DOB: 1940-09-19 Today's Date: 05/05/2012 Time: 4098-1191 PT Time Calculation (min): 30 min  PT Assessment / Plan / Recommendation Comments on Treatment Session  Pt progressing well, decreased assistance required today. Distance with ambulation still limited secondary to pain.    Follow Up Recommendations  CIR     Does the patient have the potential to tolerate intense rehabilitation     Barriers to Discharge        Equipment Recommendations  Rolling walker with 5" wheels    Recommendations for Other Services    Frequency Min 5X/week   Plan Discharge plan remains appropriate;Frequency remains appropriate    Precautions / Restrictions Precautions Precautions: Cervical Required Braces or Orthoses: Other Brace/Splint Other Brace/Splint: Halo Restrictions Weight Bearing Restrictions: No   Pertinent Vitals/Pain Pt with pain in R neck, PA in room and aware.     Mobility  Bed Mobility Bed Mobility: Supine to Sit Supine to Sit: 3: Mod assist;With rails;HOB elevated Details for Bed Mobility Assistance: pt pulling on Rt bed rail and utilizing ab muscles to lift trunk off bed surface. pt needed (A) to complete supine<>Sit transfer and to turn toward EOB Transfers Transfers: Sit to Stand;Stand to Sit Sit to Stand: 4: Min assist;With upper extremity assist;From bed Stand to Sit: 4: Min assist;With upper extremity assist;To chair/3-in-1 (mod v/c for hand placement) Details for Transfer Assistance: pt with poor hand placement and v/c for sequence for safety Ambulation/Gait Ambulation/Gait Assistance: 4: Min assist Ambulation Distance (Feet): 20 Feet Assistive device: Rolling walker Ambulation/Gait Assistance Details: Min assist for stability. Cues for safe distance to RW. Ambulated around room Gait Pattern: Step-to pattern;Wide base of support Gait velocity: slow gait speed    Exercises     PT  Diagnosis:    PT Problem List:   PT Treatment Interventions:     PT Goals Acute Rehab PT Goals PT Goal: Supine/Side to Sit - Progress: Progressing toward goal PT Goal: Sit to Supine/Side - Progress: Progressing toward goal PT Goal: Sit to Stand - Progress: Progressing toward goal PT Goal: Stand to Sit - Progress: Progressing toward goal PT Transfer Goal: Bed to Chair/Chair to Bed - Progress: Progressing toward goal PT Goal: Ambulate - Progress: Progressing toward goal  Visit Information  Last PT Received On: 05/05/12 Assistance Needed: +1 PT/OT Co-Evaluation/Treatment: Yes    Subjective Data      Cognition  Overall Cognitive Status: Appears within functional limits for tasks assessed/performed Arousal/Alertness: Awake/alert Orientation Level: Appears intact for tasks assessed Behavior During Session: [facility] for tasks performed    Balance  Balance Balance Assessed: Yes Static Standing Balance Static Standing - Balance Support: Bilateral upper extremity supported;During functional activity Static Standing - Level of Assistance: 5: Stand by assistance Static Standing - Comment/# of Minutes: Pt standing during hygiene and dressing  End of Session PT - End of Session Equipment Utilized During Treatment: Other (comment) (halo) Activity Tolerance: Patient limited by pain Patient left: in chair;with call bell/phone within reach Nurse Communication: Mobility status   GP     Milana Kidney 05/05/2012, 10:02 AM

## 2012-05-05 NOTE — Clinical Social Work Psychosocial (Signed)
Clinical Social Work Department  BRIEF PSYCHOSOCIAL ASSESSMENT  Patient: Stacey Cooley  Account Number: [id number]  Admit date: 04/30/12 Clinical Social Worker Date/Time:  Referred by: Physician Date Referred: 05/04/12 Referred for   SNF Placement   Other Referral:  Interview type: Patient  Other interview type: Patient's husband was present PSYCHOSOCIAL DATA  Living Status: FAMILY  Admitted from facility:  Level of care:  Primary support name: Alphonsine Minium Primary support relationship to patient: husband Degree of support available:  Strong and vested  CURRENT CONCERNS  Current Concerns   Post-Acute Placement   Other Concerns:  SOCIAL WORK ASSESSMENT / PLAN  CSW met with pt re: PT recommendation for SNF.   Pt lives with her husband and son in [place].   CSW explained placement process and answered questions.   Pt reports [facility] is her preference    CSW completed FL2 and initiated [facility] search.   Weekday CSW to f/u with offers.   Assessment/plan status: Information/Referral to [facility]  Other assessment/ plan:  Information/referral to community resources:  SNF   PTAR   PATIENT'S/FAMILY'S RESPONSE TO PLAN OF CARE:  Pt  And pt's husband report they are agreeable to [facility] in order to increase strength and independence with mobility prior to return home with her grandson. Pt verbalized understanding of placement process and appreciation for CSW assist.   Sabino Niemann, MSW [phone]

## 2012-05-05 NOTE — Progress Notes (Signed)
Patient comfortable in halo. Pain at right upper trapezial region slightly improved today, some pain around cranial screws, has been up with PT/OT. In good spirits.  BP 177/76  Pulse 58  Temp 98.2 F (36.8 C) (Oral)  Resp 18  Ht 5\' 2"  (1.575 m)  Wt 122.018 kg (269 lb)  BMI 49.20 kg/m2  SpO2 92%  NVI  Halo in place  Patient sat up to eat breakfast, sitting up with PT now POD #2 after C1 and C2 reduction and halo placement  - Cont PT/OT - CT reviewed yesterday demonstrated good alignment of fracture, no adjustments needed   - pain controled well, continue current regimine - clean pin sites with 50% H2O2 BID - pt will require inpatient rehab/SNF prior to release home, awaiting final recommendations

## 2012-05-05 NOTE — Clinical Social Work Placement (Signed)
Clinical Social Work Department  CLINICAL SOCIAL WORK PLACEMENT NOTE  04/30/2012  Patient:  Account Number:   Admit date: 04/30/2012  Clinical Social Worker: Sabino Niemann MSW Date/time: 05/04/12 11:30 AM  Clinical Social Work is seeking post-discharge placement for this patient at the following level of care: SKILLED NURSING (*CSW will update this form in Epic as items are completed)  05/04/12 Patient/family provided with [facility] Department of Clinical Social Work's list of facilities offering this level of care within the geographic area requested by the patient (or if unable, by the patient's family).  05/04/12 Patient/family informed of their freedom to choose among providers that offer the needed level of care, that participate in Medicare, Medicaid or managed care program needed by the patient, have an available bed and are willing to accept the patient.  05/04/12 Patient/family informed of [facility]' ownership interest in [facility], as well as of the fact that they are under no obligation to receive care at this facility.  PASARR submitted to EDS on 05/04/12  PASARR number received from EDS on 05/04/12 FL2 transmitted to all facilities in geographic area requested by pt/family on 05/04/12  FL2 transmitted to all facilities within larger geographic area on  Patient informed that his/her managed care company has contracts with or will negotiate with certain facilities, including the following:  Patient/family informed of bed offers received:  Patient chooses bed at  Physician recommends and patient chooses bed at  Patient to be transferred to on  Patient to be transferred to facility by  The following physician request were entered in Epic:  Additional Comments:  Sabino Niemann, MSW,  [phone]

## 2012-05-06 MED ORDER — SODIUM CHLORIDE 0.9 % IJ SOLN: 3.0000 mL | Freq: Two times a day (BID) | INTRAMUSCULAR | Status: DC

## 2012-05-06 NOTE — Progress Notes (Signed)
Physical Therapy Treatment Patient Details Name: Stacey Cooley MRN: [id number] DOB: 12-07-1940 Today's Date: 05/06/2012 Time: 4098-1191 PT Time Calculation (min): 14 min  PT Assessment / Plan / Recommendation Comments on Treatment Session  Pt able to progress distance with ambulation today with less c/o pain.  Motivated to increase mobility.    Follow Up Recommendations  CIR     Does the patient have the potential to tolerate intense rehabilitation     Barriers to Discharge        Equipment Recommendations  Rolling walker with 5" wheels    Recommendations for Other Services    Frequency Min 5X/week   Plan Discharge plan remains appropriate;Frequency remains appropriate    Precautions / Restrictions Precautions Precautions: Cervical Required Braces or Orthoses: Other Brace/Splint Other Brace/Splint: Halo Restrictions Weight Bearing Restrictions: No   Pertinent Vitals/Pain No c/o.    Mobility  Bed Mobility Bed Mobility: Not assessed Transfers Transfers: Sit to Stand;Stand to Sit Sit to Stand: 4: Min guard;With upper extremity assist;With armrests;From chair/3-in-1 Stand to Sit: 4: Min guard;With upper extremity assist;With armrests;To chair/3-in-1 Details for Transfer Assistance: cues for hand placement Ambulation/Gait Ambulation/Gait Assistance: 4: Min assist Ambulation Distance (Feet): 140 Feet Assistive device: Rolling walker Ambulation/Gait Assistance Details: No c/o pain with ambulation, only the feeling of heaviness and forward flexion with halo. Gait Pattern: Step-through pattern Gait velocity: slow gait speed        PT Goals Acute Rehab PT Goals PT Goal: Sit to Stand - Progress: Progressing toward goal PT Goal: Stand to Sit - Progress: Progressing toward goal PT Goal: Ambulate - Progress: Progressing toward goal  Visit Information  Last PT Received On: 05/06/12 Assistance Needed: +1    Subjective Data  Subjective: "Have you heard anything about  me going to rehab?"   Cognition  Overall Cognitive Status: Appears within functional limits for tasks assessed/performed Arousal/Alertness: Awake/alert Orientation Level: Appears intact for tasks assessed Behavior During Session: [facility] for tasks performed         End of Session PT - End of Session Equipment Utilized During Treatment: Gait belt;Other (comment) (halo) Activity Tolerance: Patient tolerated treatment well Patient left: in chair;with call bell/phone within reach Nurse Communication: Mobility status     Newell Coral 05/06/2012, 3:57 PM Newell Coral, PTA Acute Rehab [phone] (office)

## 2012-05-06 NOTE — Progress Notes (Signed)
Subjective: Doing fairly well.  Has a little difficulty arising from lying flat due to bulk/heaviness of Halo.  Is also bothered a little at her inner upper arms from rubbing side of Halo. Seems to be less bothersome at pin sites.  Tol PO, pain controlled on Percocet.  No new update from SW re: SNF placement.  Informed patient that latest xrays show acceptable alignment.  Objective: Vital signs in last 24 hours: Temp:  [98.3 F (36.8 C)-99 F (37.2 C)] 99 F (37.2 C) (12/14 1500) Pulse Rate:  [65-70] 65  (12/14 1500) Resp:  [16-18] 16  (12/14 1500) BP: (113-162)/(59-71) 113/59 mmHg (12/14 1500) SpO2:  [94 %-97 %] 94 % (12/14 1500)  Intake/Output from previous day: 12/13 0701 - 12/14 0700 In: 1240 [P.O.:1080; I.V.:160] Out: 6 [Urine:5; Stool:1] Intake/Output this shift:    No results found for this basename: HGB:5 in the last 72 hours No results found for this basename: WBC:2,RBC:2,HCT:2,PLT:2 in the last 72 hours No results found for this basename: NA:2,K:2,CL:2,CO2:2,BUN:2,CREATININE:2,GLUCOSE:2,CALCIUM:2 in the last 72 hours No results found for this basename: LABPT:2,INR:2 in the last 72 hours  Neurologically intact; pin sites clean and dry.  Assessment/Plan: Continue Halo immobilization and SNF placement efforts. Can D/C once suitable arrangement found.   Stacey Cooley A. 05/06/2012, 7:13 PM

## 2012-05-07 NOTE — Progress Notes (Signed)
Subjective: Did well o'night.  Accomplished bathing this am.  Still bothered medial upper arms due to halo vest   Objective: Vital signs in last 24 hours: Temp:  [97.8 F (36.6 C)-99 F (37.2 C)] 97.8 F (36.6 C) (12/15 0438) Pulse Rate:  [64-69] 69  (12/15 0438) Resp:  [16-18] 18  (12/15 0438) BP: (113-129)/(59-72) 129/72 mmHg (12/15 0438) SpO2:  [94 %-96 %] 95 % (12/15 0438)  Intake/Output from previous day: 12/14 0701 - 12/15 0700 In: 880 [P.O.:880] Out: -  Intake/Output this shift:    No results found for this basename: HGB:5 in the last 72 hours No results found for this basename: WBC:2,RBC:2,HCT:2,PLT:2 in the last 72 hours No results found for this basename: NA:2,K:2,CL:2,CO2:2,BUN:2,CREATININE:2,GLUCOSE:2,CALCIUM:2 in the last 72 hours No results found for this basename: LABPT:2,INR:2 in the last 72 hours  Neurovascular intact pin sites benign  Assessment/Plan: Continue Halo and placement efforts D/C IV, D/C telemetry   Aydyn Testerman A. 05/07/2012, 11:10 AM

## 2012-05-08 ENCOUNTER — Encounter: Payer: Self-pay | Admitting: Internal Medicine

## 2012-05-08 MED ORDER — HYDROCODONE-ACETAMINOPHEN 5-325 MG PO TABS: 1.0000 | ORAL_TABLET | ORAL | Status: DC | PRN

## 2012-05-08 NOTE — Progress Notes (Signed)
Report called to [facility] rehab center using [facility] [employer]. Report given to [facility]. All questions answered. FL2 paperwork ready for transport vehicle.

## 2012-05-08 NOTE — Progress Notes (Signed)
Subjective: No c/o.  Head throbbing much improved.  Pt was sleeping comfortably upon my arrival    Objective: Vital signs in last 24 hours: Temp:  [98 F (36.7 C)-98.6 F (37 C)] 98 F (36.7 C) (12/16 0454) Pulse Rate:  [64-70] 70  (12/16 0454) Resp:  [18] 18  (12/16 0454) BP: (114-153)/(54-64) 153/64 mmHg (12/16 0454) SpO2:  [95 %-96 %] 96 % (12/16 0454)  Intake/Output from previous day: 12/15 0701 - 12/16 0700 In: 1520 [P.O.:1520] Out: -  Intake/Output this shift:    No results found for this basename: HGB:5 in the last 72 hours No results found for this basename: WBC:2,RBC:2,HCT:2,PLT:2 in the last 72 hours No results found for this basename: NA:2,K:2,CL:2,CO2:2,BUN:2,CREATININE:2,GLUCOSE:2,CALCIUM:2 in the last 72 hours No results found for this basename: LABPT:2,INR:2 in the last 72 hours  Neurovascular intact pin sites benign, halo parts not loose  Assessment/Plan: Continue Halo, D/C IV D/C today to SNF F/U Dr. Yevette Edwards this Friday   Stacey Cooley A. 05/08/2012, 7:25 AM

## 2012-05-08 NOTE — Progress Notes (Signed)
Physical Therapy Treatment Patient Details Name: Stacey Cooley MRN: [id number] DOB: 1940/06/24 Today's Date: 05/08/2012 Time: 4098-1191 PT Time Calculation (min): 16 min  PT Assessment / Plan / Recommendation Comments on Treatment Session  pt presents with C1-2 fx with halo.  pt moving well and making progress.  Anticipates D/C to SNF today.      Follow Up Recommendations  SNF     Does the patient have the potential to tolerate intense rehabilitation     Barriers to Discharge        Equipment Recommendations  Rolling walker with 5" wheels    Recommendations for Other Services    Frequency Min 5X/week   Plan Discharge plan remains appropriate;Frequency remains appropriate    Precautions / Restrictions Precautions Precautions: Cervical Required Braces or Orthoses: Other Brace/Splint Other Brace/Splint: Halo Restrictions Weight Bearing Restrictions: No   Pertinent Vitals/Pain Denies much pain, just heaviness from halo.      Mobility  Bed Mobility Bed Mobility: Not assessed Transfers Transfers: Sit to Stand;Stand to Sit Sit to Stand: 4: Min guard;With upper extremity assist;With armrests;From chair/3-in-1 Stand to Sit: 4: Min guard;With upper extremity assist;With armrests;To chair/3-in-1 Details for Transfer Assistance: Demos good use of UEs.   Ambulation/Gait Ambulation/Gait Assistance: 4: Min guard Ambulation Distance (Feet): 160 Feet Assistive device: Rolling walker Ambulation/Gait Assistance Details: pt demos good use of RW, only c/o heaviness from halo.   Gait Pattern: Step-through pattern;Decreased stride length Stairs: No Wheelchair Mobility Wheelchair Mobility: No    Exercises     PT Diagnosis:    PT Problem List:   PT Treatment Interventions:     PT Goals Acute Rehab PT Goals Time For Goal Achievement: 05/18/12 Potential to Achieve Goals: Good PT Goal: Sit to Stand - Progress: Progressing toward goal PT Goal: Stand to Sit - Progress: Progressing  toward goal PT Goal: Ambulate - Progress: Progressing toward goal  Visit Information  Last PT Received On: 05/08/12 Assistance Needed: +1    Subjective Data  Subjective: I think I'm supposed to go to a rehab today.     Cognition  Overall Cognitive Status: Appears within functional limits for tasks assessed/performed Arousal/Alertness: Awake/alert Orientation Level: Appears intact for tasks assessed Behavior During Session: [facility] for tasks performed    Balance     End of Session PT - End of Session Equipment Utilized During Treatment: Gait belt (halo) Activity Tolerance: Patient tolerated treatment well Patient left: in chair;with call bell/phone within reach Nurse Communication: Mobility status   GP     [name], [place] [phone] 05/08/2012, 8:31 AM

## 2012-05-08 NOTE — Discharge Summary (Signed)
Physician Discharge Summary  Patient ID: Stacey Cooley MRN: [id number] DOB/AGE: 11/14/1940 71 y.o.  Admit date: 04/30/2012 Discharge date: 05/08/2012  Admission Diagnoses:  Cervical spine fracture  Discharge Diagnoses:  Principal Problem:  *Cervical spine fracture Active Problems:  Hypotension, unspecified  Renal insufficiency  Anemia  Alcohol abuse  Nausea  Transaminitis  Hyperkalemia  Obesity   Past Medical History  Diagnosis Date  . HTN (hypertension)   . Morbid obesity   . DDD (degenerative disc disease), cervical   . Rheumatic fever     child  . Dyslipidemia   . Swelling of joint of lower leg     recurrent lower extremity leg swelling, noted by pt  . 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 Anemia   . Hypothyroid   . Renal insufficiency 09/06/2011  . Varicose vein of leg   . Uterine cancer 1996    s/p hysterectomy; patient reported low stage; did not need adjuvant chemo  or radiation.   Marland Kitchen MRSA (methicillin resistant staph aureus) culture positive   . TACHYCARDIA 10/17/2009    Qualifier: Diagnosis of  By: Mariah Milling MD, Tim    . Complication of anesthesia     Surgeries: Procedure(s): HALO TRACTION APPLICATION on 04/30/2012 - 05/03/2012   Consultants (if any): Treatment Team:  Dorothea Ogle, MD Eddie North, MD  Discharged Condition: Improved  Hospital Course: Stacey Cooley is an 71 y.o. female who was admitted 04/30/2012 with a diagnosis of Cervical spine fracture and went to the operating room on 04/30/2012 - 05/03/2012 and underwent the above named procedures. F/u xrays on 12-13 revealed acceptable alignment.  Pt remained in hospital until suitable SNF d/c arrangements made and implemented.    She was given perioperative antibiotics:  Anti-infectives     Start     Dose/Rate Route Frequency Ordered Stop   05/04/12 0003   ceFAZolin  (ANCEF) IVPB 1 g/50 mL premix        1 g 100 mL/hr over 30 Minutes Intravenous Every 8 hours 05/04/12 0003 05/04/12 1614        .  She was given sequential compression devices, early ambulation for DVT prophylaxis.  She benefited maximally from the hospital stay and there were no complications.    Recent vital signs:  Filed Vitals:   05/08/12 0454  BP: 153/64  Pulse: 70  Temp: 98 F (36.7 C)  Resp: 18    Recent laboratory studies:  Lab Results  Component Value Date   HGB 10.6* 05/02/2012   HGB 10.6* 05/01/2012   HGB 11.1* 04/26/2012   Lab Results  Component Value Date   WBC 6.7 05/02/2012   PLT 150 05/02/2012   Lab Results  Component Value Date   INR 1.03 09/29/2009   Lab Results  Component Value Date   NA 131* 05/02/2012   K 3.8 05/02/2012   CL 93* 05/02/2012   CO2 28 05/02/2012   BUN 20 05/02/2012   CREATININE 0.80 05/02/2012   GLUCOSE 121* 05/02/2012    Discharge Medications:     Medication List     As of 05/08/2012  7:36 AM    TAKE these medications         acetaminophen-codeine 300-30 MG per tablet   Commonly known as: TYLENOL #3   Take 1 tablet by mouth 2 (two) times daily.  allopurinol 300 MG tablet   Commonly known as: ZYLOPRIM   Take 300 mg by mouth daily.      aspirin 81 MG tablet   Take 81 mg by mouth at bedtime.      benazepril 20 MG tablet   Commonly known as: LOTENSIN   Take 20 mg by mouth every morning.      CELEBREX 100 MG capsule   Generic drug: celecoxib   Take 100 mg by mouth every morning. May take an additional capsule at night if needed for pain      citalopram 20 MG tablet   Commonly known as: CELEXA   Take 20 mg by mouth daily.      ferrous fumarate 50 MG CR tablet   Commonly known as: FERRO-SEQUELS   Take 50 mg by mouth at bedtime.      fluticasone 27.5 MCG/SPRAY nasal spray   Commonly known as: VERAMYST   Place 2 sprays into the nose daily as needed. For nasal congestion      HYDROcodone-acetaminophen  5-325 MG per tablet   Commonly known as: NORCO/VICODIN   Take 1-2 tablets by mouth every 4 (four) hours as needed.      levothyroxine 75 MCG tablet   Commonly known as: SYNTHROID, LEVOTHROID   Take 75 mcg by mouth daily.      metoprolol tartrate 25 MG tablet   Commonly known as: LOPRESSOR   Take 25 mg by mouth 2 (two) times daily.      oxybutynin 5 MG tablet   Commonly known as: DITROPAN   Take 5 mg by mouth Daily.      tiZANidine 2 MG tablet   Commonly known as: ZANAFLEX   Take 2 mg by mouth 2 (two) times daily.        Diagnostic Studies: Dg Cervical Spine 1 View  05/05/2012  *RADIOLOGY REPORT*  Clinical Data: Halo placement, C1 and C2 fractures  DG CERVICAL SPINE - 1 VIEW  Comparison: Lateral views are compared to CT of 05/04/2012  Findings: Post anterior and posterior fusion from C3 inferiorly. Displaced odontoid fracture is less well visualized on radiographs than on prior CT. Minimally displaced fracture at the posterior arch of C1. The anterior arch fracture on is not visualized. Minimal soft tissue swelling anterior to C1-C2. Odontoid fragment appears slightly tilted posteriorly but not significantly displaced.  IMPRESSION: Post anterior and posterior cervical spine fusion from C3 inferiorly. Minimal angulation of the odontoid fragment.   Original Report Authenticated By: Ulyses Southward, M.D.    Dg Cervical Spine 1 View  05/02/2012  *RADIOLOGY REPORT*  Clinical Data: History of dens fracture, post repositioning of cervical collar  DG CERVICAL SPINE - 1 VIEW  Comparison: Cervical spine radiographs - earlier same day; 05/01/2012; cervical spine CT - 04/30/2012  Findings:  Previously noted dorsally displaced odontoid fracture is grossly unchanged with persistent dorsal displacement ranging between 7 and 9 mm.  No new comminuted fracture of the anterior ring of C1 are grossly unchanged.  Post lower cervical ACDF and paraspinal fusion again incompletely imaged.  Prevertebral soft tissues  are unchanged.  IMPRESSION: Grossly unchanged appearance of known odontoid fracture with dorsal displacement ranging between 7 to 9 mm.   Original Report Authenticated By: Tacey Ruiz, MD    Dg Cervical Spine 1 View  05/02/2012  *RADIOLOGY REPORT*  Clinical Data: C2 fracture  DG CERVICAL SPINE - 1 VIEW  Comparison: Cervical spine radiographs - 05/01/2012; cervical spine CT - 04/30/2012  Findings:  C1 to the superior endplate of C6 is imaged.  There is suboptimal evaluation of C6 - C7 and the cervical thoracic junction secondary to overlying osseous to soft tissue structures.  The head is held in a minimal degree of extension.  There is apparent increased displacement of previously noted odontoid fracture, now with approximately 9 mm of dorsal displacement, previously, approximately 4 mm.  Osseous fragments adjacent to the inferior aspect of the anterior ring of C1 appear grossly unchanged though are incompletely evaluated.  Post ACDF C4 - C7 and bilateral paraspinal fusion of the C3 - T1, though note, the inferior aspect of the hardware is suboptimally evaluated secondary to overlying osseous and soft tissue structures.  Prevertebral soft tissues are normal.  IMPRESSION: 1.  Increased dorsal displacement of odontoid fracture with the head held in a minimal degree of extension, worrisome for instability. 2.  Comminuted fracture of the anterior ring of C1, incompletely evaluated. 3.  Post ACDF and paraspinal fusion of the lower cervical spine, incompletely evaluated.   Original Report Authenticated By: Tacey Ruiz, MD    Dg Cervical Spine 2-3 Views  05/03/2012  *RADIOLOGY REPORT*  Clinical Data: Cervical spine fracture.  Halo application.  DG C-ARM 1-60 MIN,CERVICAL SPINE - 2-3 VIEW  Technique: Dual intraoperative fluoroscopic lateral spot films are submitted for interpretation.  Comparison:  None.  Findings: These demonstrate the odontoid fracture with improved alignment on the post reduction images.   Partially visualized cervical spine fixation hardware.  IMPRESSION: Intraoperative fluoroscopy demonstrating reduction of C2 fracture.   Original Report Authenticated By: Andreas Newport, M.D.    Dg Cervical Spine 2-3 Views  05/01/2012  *RADIOLOGY REPORT*  Clinical Data: Followup C1-2 fractures.  CERVICAL SPINE - 2-3 VIEW  Comparison: None.  Findings: Previous anterior cervical disc fusions are seen from levels of C4-C7, and posterior pedicle screws and fixation rods are seen extending from levels of C3-T1.  Fracture of the base of the odontoid process is seen with mild dorsal displacement and angulation.  Anterior arch of C1 also appears fractured, without evidence of atlantoaxial subluxation.  IMPRESSION:  1.  Odontoid fracture with mild dorsal displacement and angulation. 2.  Fracture of the anterior pressures C1, without evidence of atlantoaxial subluxation.   Original Report Authenticated By: Myles Rosenthal, M.D.    Ct Cervical Spine Wo Contrast  05/04/2012  *RADIOLOGY REPORT*  Clinical Data: Closed reduction of C1 Jefferson type fracture and closed reduction of C2 type 2 odontoid fracture using a halo vest.  CT CERVICAL SPINE WITHOUT CONTRAST  Technique:  Multidetector CT imaging of the cervical spine was performed. Multiplanar CT image reconstructions were also generated.  Comparison: 04/30/2012 CT, [facility].Intraoperative films 05/03/2012.  Findings: The patient has application of a Halo vest in the operating room with fluoroscopic manipulation of the neck. Intraoperative films from 05/03/2012 showed improved C2 alignment.  - C1 Jefferson fracture: Fractures of the right and left posterior arch are redemonstrated (image 16 series 2).  There is closer approximation of the posterior fracture fragments to the posterior arch on both sides, with slight offset.  The anterior arch fracture is slightly wider in the midline anteriorly(2.2 mm image 10 as compared with 1.6 mm previously), but  both sides of the anterior arch are closely approximated to the odontoid.  - Type 2 odontoid fracture:  The overall sagittal alignment of the type 2 fracture is improved, with the odontoid located slightly more anterior. Due to slight flexion, the width of the C2  fracture line is decreased. There is moderate pannus surrounding the odontoid, partially calcified, with no visible epidural hematoma.  - Lower cervical region:  The patient has undergone prior C4-C7 ACDF augmented with posterior fixation C3-T1.  The C4-C7 fusion is solid.  Moderate spondylosis is present without acute features. There is slight 2 mm facet mediated slip C7 on T1.  IMPRESSION: Improved appearance status post closed reduction of C1 and C2 fractures.  See discussion above.   Original Report Authenticated By: Davonna Belling, M.D.    Dg C-arm 1-60 Min  05/03/2012  *RADIOLOGY REPORT*  Clinical Data: Cervical spine fracture.  Halo application.  DG C-ARM 1-60 MIN,CERVICAL SPINE - 2-3 VIEW  Technique: Dual intraoperative fluoroscopic lateral spot films are submitted for interpretation.  Comparison:  None.  Findings: These demonstrate the odontoid fracture with improved alignment on the post reduction images.  Partially visualized cervical spine fixation hardware.  IMPRESSION: Intraoperative fluoroscopy demonstrating reduction of C2 fracture.   Original Report Authenticated By: Andreas Newport, M.D.     Disposition:       Discharge Orders    Future Appointments: Provider: Department: Dept Phone: Center:   07/26/2012 10:00 AM [facility] Only [facility] [phone] None   10/25/2012 11:30 AM Dava Najjar [place] [facility] MEDICAL ONCOL[facility] [phone] None   10/25/2012 12:00 PM Exie Parody, MD [facility] [phone] None      Follow-up Information    Schedule an appointment as soon as possible for a visit with Emilee Hero, MD. (on Friday, 05-12-12)     Contact information:   [address] [place] [address] [phone]           Signed: Janee Morn, Bobbye Reinitz A. 05/08/2012, 7:36 AM

## 2012-05-22 ENCOUNTER — Ambulatory Visit
Admission: RE | Admit: 2012-05-22 | Discharge: 2012-05-22 | Disposition: A | Discharge status: Home / Self Care | Payer: Medicare Other | Source: Ambulatory Visit | Attending: Orthopedic Surgery | Admitting: Orthopedic Surgery

## 2012-05-22 ENCOUNTER — Other Ambulatory Visit: Payer: Self-pay | Admitting: Orthopedic Surgery

## 2012-05-22 DIAGNOSIS — S129XXA Fracture of neck, unspecified, initial encounter: Secondary | ICD-10-CM

## 2012-05-24 ENCOUNTER — Encounter: Payer: Self-pay | Admitting: Internal Medicine

## 2012-06-24 ENCOUNTER — Encounter: Payer: Self-pay | Admitting: Internal Medicine

## 2012-07-11 ENCOUNTER — Other Ambulatory Visit: Payer: Self-pay | Admitting: Orthopedic Surgery

## 2012-07-11 ENCOUNTER — Ambulatory Visit
Admission: RE | Admit: 2012-07-11 | Discharge: 2012-07-11 | Disposition: A | Discharge status: Home / Self Care | Payer: Medicare Other | Source: Ambulatory Visit | Attending: Orthopedic Surgery | Admitting: Orthopedic Surgery

## 2012-07-11 DIAGNOSIS — S12100A Unspecified displaced fracture of second cervical vertebra, initial encounter for closed fracture: Secondary | ICD-10-CM

## 2012-07-14 ENCOUNTER — Emergency Department: Payer: Self-pay | Admitting: Emergency Medicine

## 2012-07-14 LAB — URINALYSIS, COMPLETE
Bilirubin,UR: NEGATIVE
Blood: NEGATIVE
Glucose,UR: NEGATIVE mg/dL (ref 0–75)
Ketone: NEGATIVE
Ph: 6 (ref 4.5–8.0)
Specific Gravity: 1.006 (ref 1.003–1.030)
WBC UR: <1 /HPF (ref 0–5)

## 2012-07-14 LAB — COMPREHENSIVE METABOLIC PANEL
Albumin: 3.7 g/dL (ref 3.4–5.0)
Anion Gap: 14 (ref 7–16)
BUN: 12 mg/dL (ref 7–18)
Bilirubin,Total: 0.4 mg/dL (ref 0.2–1.0)
Calcium, Total: 9.1 mg/dL (ref 8.5–10.1)
Creatinine: 0.92 mg/dL (ref 0.60–1.30)
EGFR (Non-African Amer.): >60
Glucose: 85 mg/dL (ref 65–99)
SGOT(AST): 44 U/L — ABNORMAL HIGH (ref 15–37)
SGPT (ALT): 34 U/L (ref 12–78)
Total Protein: 8 g/dL (ref 6.4–8.2)

## 2012-07-14 LAB — TSH: Thyroid Stimulating Horm: 1.55 u[IU]/mL

## 2012-07-14 LAB — CBC WITH DIFFERENTIAL/PLATELET
Eosinophil: 4 %
HCT: 36.8 % (ref 35.0–47.0)
HGB: 11.5 g/dL — ABNORMAL LOW (ref 12.0–16.0)
Lymphocytes: 61 %
RDW: 16.7 % — ABNORMAL HIGH (ref 11.5–14.5)
Segmented Neutrophils: 26 %
WBC: 6.5 10*3/uL (ref 3.6–11.0)

## 2012-07-14 LAB — DRUG SCREEN, URINE
Amphetamines, Ur Screen: NEGATIVE (ref ?–1000)
Barbiturates, Ur Screen: NEGATIVE (ref ?–200)
MDMA (Ecstasy)Ur Screen: NEGATIVE (ref ?–500)
Tricyclic, Ur Screen: NEGATIVE (ref ?–1000)

## 2012-07-14 LAB — ETHANOL: Ethanol: 226 mg/dL

## 2012-07-15 LAB — ETHANOL
Ethanol %: <0.003 % (ref 0.000–0.080)
Ethanol: <3 mg/dL

## 2012-07-26 ENCOUNTER — Other Ambulatory Visit: Payer: Self-pay | Admitting: Orthopedic Surgery

## 2012-07-26 ENCOUNTER — Other Ambulatory Visit (HOSPITAL_BASED_OUTPATIENT_CLINIC_OR_DEPARTMENT_OTHER): Payer: Medicare Other | Admitting: Lab

## 2012-07-26 ENCOUNTER — Telehealth: Payer: Self-pay | Admitting: *Deleted

## 2012-07-26 ENCOUNTER — Ambulatory Visit
Admission: RE | Admit: 2012-07-26 | Discharge: 2012-07-26 | Disposition: A | Discharge status: Home / Self Care | Payer: Medicare Other | Source: Ambulatory Visit | Attending: Orthopedic Surgery | Admitting: Orthopedic Surgery

## 2012-07-26 DIAGNOSIS — S12100D Unspecified displaced fracture of second cervical vertebra, subsequent encounter for fracture with routine healing: Secondary | ICD-10-CM

## 2012-07-26 DIAGNOSIS — D649 Anemia, unspecified: Secondary | ICD-10-CM

## 2012-07-26 LAB — CBC WITH DIFFERENTIAL/PLATELET
Basophils Absolute: 0.1 10*3/uL (ref 0.0–0.1)
Eosinophils Absolute: 0.2 10*3/uL (ref 0.0–0.5)
HGB: 11 g/dL — ABNORMAL LOW (ref 11.6–15.9)
MONO#: 0.5 10*3/uL (ref 0.1–0.9)
NEUT#: 2.6 10*3/uL (ref 1.5–6.5)
RDW: 15.9 % — ABNORMAL HIGH (ref 11.2–14.5)
lymph#: 3.6 10*3/uL — ABNORMAL HIGH (ref 0.9–3.3)

## 2012-07-26 NOTE — Telephone Encounter (Signed)
Message copied by Wende Mott on Wed Jul 26, 2012  2:22 PM ------      Message from: Clenton Pare R      Created: Wed Jul 26, 2012  1:11 PM       Please call pt. Hgb is up slightly. Recommend continued observation. ------

## 2012-07-26 NOTE — Telephone Encounter (Signed)
Called pt w/ lab results and instructed to keep next appt as scheduled in June.  Pt verbalized understanding.

## 2012-07-31 ENCOUNTER — Encounter (HOSPITAL_COMMUNITY): Payer: Self-pay | Admitting: Pharmacy Technician

## 2012-07-31 ENCOUNTER — Other Ambulatory Visit: Payer: Self-pay | Admitting: Orthopedic Surgery

## 2012-08-02 ENCOUNTER — Encounter (HOSPITAL_COMMUNITY): Payer: Self-pay

## 2012-08-02 ENCOUNTER — Encounter (HOSPITAL_COMMUNITY)
Admission: RE | Admit: 2012-08-02 | Discharge: 2012-08-02 | Disposition: A | Discharge status: Home / Self Care | Payer: Medicare Other | Source: Ambulatory Visit | Attending: Orthopedic Surgery | Admitting: Orthopedic Surgery

## 2012-08-02 ENCOUNTER — Ambulatory Visit (HOSPITAL_COMMUNITY)
Admission: RE | Admit: 2012-08-02 | Discharge: 2012-08-02 | Disposition: A | Discharge status: Home / Self Care | Payer: Medicare Other | Source: Ambulatory Visit | Attending: Orthopedic Surgery | Admitting: Orthopedic Surgery

## 2012-08-02 DIAGNOSIS — Z01818 Encounter for other preprocedural examination: Secondary | ICD-10-CM | POA: Insufficient documentation

## 2012-08-02 DIAGNOSIS — Z01812 Encounter for preprocedural laboratory examination: Secondary | ICD-10-CM | POA: Insufficient documentation

## 2012-08-02 DIAGNOSIS — M47814 Spondylosis without myelopathy or radiculopathy, thoracic region: Secondary | ICD-10-CM | POA: Insufficient documentation

## 2012-08-02 HISTORY — DX: Anxiety disorder, unspecified: F41.9

## 2012-08-02 HISTORY — DX: Cough, unspecified: R05.9

## 2012-08-02 HISTORY — DX: Cardiac murmur, unspecified: R01.1

## 2012-08-02 HISTORY — DX: Cough: R05

## 2012-08-02 LAB — APTT: aPTT: 27 seconds (ref 24–37)

## 2012-08-02 LAB — COMPREHENSIVE METABOLIC PANEL
ALT: 38 U/L — ABNORMAL HIGH (ref 0–35)
Albumin: 4.1 g/dL (ref 3.5–5.2)
Alkaline Phosphatase: 151 U/L — ABNORMAL HIGH (ref 39–117)
BUN: 15 mg/dL (ref 6–23)
Potassium: 4 mEq/L (ref 3.5–5.1)
Sodium: 144 mEq/L (ref 135–145)
Total Protein: 7.9 g/dL (ref 6.0–8.3)

## 2012-08-02 LAB — CBC WITH DIFFERENTIAL/PLATELET
Basophils Absolute: 0.1 10*3/uL (ref 0.0–0.1)
Eosinophils Absolute: 0.2 10*3/uL (ref 0.0–0.7)
Lymphocytes Relative: 67 % — ABNORMAL HIGH (ref 12–46)
MCHC: 32.5 g/dL (ref 30.0–36.0)
Neutrophils Relative %: 26 % — ABNORMAL LOW (ref 43–77)
Platelets: 287 10*3/uL (ref 150–400)
RDW: 14.9 % (ref 11.5–15.5)

## 2012-08-02 LAB — URINALYSIS, ROUTINE W REFLEX MICROSCOPIC
Bilirubin Urine: NEGATIVE
Nitrite: NEGATIVE
Specific Gravity, Urine: 1.016 (ref 1.005–1.030)
Urobilinogen, UA: 0.2 mg/dL (ref 0.0–1.0)

## 2012-08-02 LAB — SURGICAL PCR SCREEN: Staphylococcus aureus: NEGATIVE

## 2012-08-02 LAB — PROTIME-INR: Prothrombin Time: 12.5 seconds (ref 11.6–15.2)

## 2012-08-02 LAB — TYPE AND SCREEN: Antibody Screen: NEGATIVE

## 2012-08-02 MED ORDER — VANCOMYCIN HCL 10 G IV SOLR
1500.0000 mg | INTRAVENOUS | Status: AC
  Administered 2012-08-03: 1500 mg via INTRAVENOUS
  Filled 2012-08-02: qty 1500

## 2012-08-02 NOTE — Progress Notes (Signed)
ALLISON MADE AWARE OF ALK PHOS LEVEL 151.

## 2012-08-02 NOTE — Progress Notes (Signed)
08/02/12 1415  OBSTRUCTIVE SLEEP APNEA  Have you ever been diagnosed with sleep apnea through a sleep study? No  Do you snore loudly (loud enough to be heard through closed doors)?  1  Do you often feel tired, fatigued, or sleepy during the daytime? 1  Has anyone observed you stop breathing during your sleep? 1  Do you have, or are you being treated for high blood pressure? 1  BMI more than 35 kg/m2? 1  Age over 70 years old? 1  Gender: 0  Obstructive Sleep Apnea Score 6  Score 4 or greater  Results sent to PCP

## 2012-08-02 NOTE — Pre-Procedure Instructions (Signed)
Stacey Cooley  08/02/2012   Your procedure is scheduled on:  Thursday, March 13th   Report to [facility] at  9:00 AM.   Call this number if you have problems the morning of surgery: [phone]   Remember:   Do not eat food or drink liquids after midnight tonight.    Take these medicines the morning of surgery with A SIP OF WATER: Celexa, Flonase, Norco,              Levothyroxine, Metoprolol, Ditropan   Do not wear jewelry, make-up or nail polish.  Do not wear lotions, powders, or perfumes. You may NOT wear deodorant.  Do not shave underarms & legs 48 hours prior to surgery.    Do not bring valuables to the hospital.  Contacts, dentures or bridgework may not be worn into surgery.   Leave suitcase in the car. After surgery it may be brought to your room.  For patients admitted to the hospital, checkout time is 11:00 AM the day of discharge.   Name and phone number of your driver:    Special Instructions: Shower using CHG 2 nights before surgery and the night before surgery.  If you shower the day of surgery use CHG.  Use special wash - you have one bottle of CHG for all showers.  You should use approximately 1/3 of the bottle for each shower.   Please read over the following fact sheets that you were given: Pain Booklet, Coughing and Deep Breathing, Blood Transfusion Information, MRSA Information and Surgical Site Infection Prevention

## 2012-08-02 NOTE — Progress Notes (Signed)
LEFT MESSAGE FOR DR. Yevette Edwards TO ADD WHICH HIP BONE GRAFT WOULD BE TAKEN.  PATIENT NEEDS TO SIGN CONSENT DAY OF SURGERY.

## 2012-08-03 ENCOUNTER — Inpatient Hospital Stay (HOSPITAL_COMMUNITY): Payer: Medicare Other

## 2012-08-03 ENCOUNTER — Encounter (HOSPITAL_COMMUNITY): Payer: Self-pay | Admitting: *Deleted

## 2012-08-03 ENCOUNTER — Inpatient Hospital Stay (HOSPITAL_COMMUNITY): Payer: Medicare Other | Admitting: Anesthesiology

## 2012-08-03 ENCOUNTER — Encounter (HOSPITAL_COMMUNITY)
Admission: RE | Disposition: A | Discharge status: Home / Self Care | Payer: Self-pay | Source: Ambulatory Visit | Attending: Orthopedic Surgery

## 2012-08-03 ENCOUNTER — Inpatient Hospital Stay (HOSPITAL_COMMUNITY)
Admission: RE | Admit: 2012-08-03 | Discharge: 2012-08-06 | DRG: 472 | Disposition: A | Discharge status: Home / Self Care | Payer: Medicare Other | Source: Ambulatory Visit | Attending: Orthopedic Surgery | Admitting: Orthopedic Surgery

## 2012-08-03 ENCOUNTER — Encounter (HOSPITAL_COMMUNITY): Payer: Self-pay | Admitting: Vascular Surgery

## 2012-08-03 DIAGNOSIS — I739 Peripheral vascular disease, unspecified: Secondary | ICD-10-CM | POA: Diagnosis present

## 2012-08-03 DIAGNOSIS — Z96659 Presence of unspecified artificial knee joint: Secondary | ICD-10-CM

## 2012-08-03 DIAGNOSIS — Z8614 Personal history of Methicillin resistant Staphylococcus aureus infection: Secondary | ICD-10-CM

## 2012-08-03 DIAGNOSIS — F411 Generalized anxiety disorder: Secondary | ICD-10-CM | POA: Diagnosis present

## 2012-08-03 DIAGNOSIS — Z01818 Encounter for other preprocedural examination: Secondary | ICD-10-CM

## 2012-08-03 DIAGNOSIS — Z87891 Personal history of nicotine dependence: Secondary | ICD-10-CM

## 2012-08-03 DIAGNOSIS — Z981 Arthrodesis status: Secondary | ICD-10-CM

## 2012-08-03 DIAGNOSIS — W19XXXS Unspecified fall, sequela: Secondary | ICD-10-CM

## 2012-08-03 DIAGNOSIS — Z01812 Encounter for preprocedural laboratory examination: Secondary | ICD-10-CM

## 2012-08-03 DIAGNOSIS — IMO0002 Reserved for concepts with insufficient information to code with codable children: Principal | ICD-10-CM | POA: Diagnosis present

## 2012-08-03 DIAGNOSIS — E039 Hypothyroidism, unspecified: Secondary | ICD-10-CM | POA: Diagnosis present

## 2012-08-03 DIAGNOSIS — I10 Essential (primary) hypertension: Secondary | ICD-10-CM | POA: Diagnosis present

## 2012-08-03 DIAGNOSIS — E785 Hyperlipidemia, unspecified: Secondary | ICD-10-CM | POA: Diagnosis present

## 2012-08-03 DIAGNOSIS — Z96649 Presence of unspecified artificial hip joint: Secondary | ICD-10-CM

## 2012-08-03 DIAGNOSIS — Z79899 Other long term (current) drug therapy: Secondary | ICD-10-CM

## 2012-08-03 DIAGNOSIS — Z6841 Body Mass Index (BMI) 40.0 and over, adult: Secondary | ICD-10-CM

## 2012-08-03 DIAGNOSIS — Z8542 Personal history of malignant neoplasm of other parts of uterus: Secondary | ICD-10-CM

## 2012-08-03 HISTORY — PX: POSTERIOR CERVICAL FUSION/FORAMINOTOMY: SHX5038

## 2012-08-03 SURGERY — POSTERIOR CERVICAL FUSION/FORAMINOTOMY LEVEL 2
Anesthesia: General | Site: Spine Cervical | Wound class: Clean

## 2012-08-03 MED ORDER — POTASSIUM GLUCONATE 550 MG PO TABS: 1.0000 | ORAL_TABLET | Freq: Every day | ORAL | Status: DC

## 2012-08-03 MED ORDER — ONDANSETRON HCL 4 MG/2ML IJ SOLN: 4.0000 mg | Freq: Four times a day (QID) | INTRAMUSCULAR | Status: DC | PRN

## 2012-08-03 MED ORDER — SUCCINYLCHOLINE CHLORIDE 20 MG/ML IJ SOLN
INTRAMUSCULAR | Status: DC | PRN
  Administered 2012-08-03: 100 mg via INTRAVENOUS

## 2012-08-03 MED ORDER — MORPHINE SULFATE (PF) 1 MG/ML IV SOLN
INTRAVENOUS | Status: DC
  Administered 2012-08-04 (×2): 4.5 mg via INTRAVENOUS
  Administered 2012-08-04: 9 mg via INTRAVENOUS
  Administered 2012-08-04: 1.5 mg via INTRAVENOUS

## 2012-08-03 MED ORDER — THROMBIN 20000 UNITS EX SOLR
CUTANEOUS | Status: DC | PRN
  Administered 2012-08-03: 20000 [IU] via TOPICAL

## 2012-08-03 MED ORDER — FLUTICASONE PROPIONATE 50 MCG/ACT NA SUSP
3.0000 | Freq: Every day | NASAL | Status: DC
  Administered 2012-08-04 – 2012-08-05 (×2): 3 via NASAL
  Filled 2012-08-03: qty 16

## 2012-08-03 MED ORDER — DOCUSATE SODIUM 100 MG PO CAPS
100.0000 mg | ORAL_CAPSULE | Freq: Two times a day (BID) | ORAL | Status: DC
  Administered 2012-08-03 – 2012-08-06 (×6): 100 mg via ORAL
  Filled 2012-08-03 (×8): qty 1

## 2012-08-03 MED ORDER — OXYCODONE HCL 5 MG/5ML PO SOLN: 5.0000 mg | Freq: Once | ORAL | Status: DC | PRN

## 2012-08-03 MED ORDER — ACETAMINOPHEN 325 MG PO TABS: 650.0000 mg | ORAL_TABLET | ORAL | Status: DC | PRN

## 2012-08-03 MED ORDER — ROCURONIUM BROMIDE 100 MG/10ML IV SOLN
INTRAVENOUS | Status: DC | PRN
  Administered 2012-08-03: 15 mg via INTRAVENOUS
  Administered 2012-08-03: 5 mg via INTRAVENOUS
  Administered 2012-08-03 (×4): 10 mg via INTRAVENOUS
  Administered 2012-08-03: 30 mg via INTRAVENOUS
  Administered 2012-08-03 (×3): 10 mg via INTRAVENOUS

## 2012-08-03 MED ORDER — POVIDONE-IODINE 7.5 % EX SOLN: Freq: Once | CUTANEOUS | Status: DC

## 2012-08-03 MED ORDER — VITAMIN D 50 MCG (2000 UT) PO CAPS: 1.0000 | ORAL_CAPSULE | Freq: Two times a day (BID) | ORAL | Status: DC

## 2012-08-03 MED ORDER — LACTATED RINGERS IV SOLN
INTRAVENOUS | Status: DC | PRN
  Administered 2012-08-03 (×3): via INTRAVENOUS

## 2012-08-03 MED ORDER — DIPHENHYDRAMINE HCL 50 MG/ML IJ SOLN: 12.5000 mg | Freq: Four times a day (QID) | INTRAMUSCULAR | Status: DC | PRN

## 2012-08-03 MED ORDER — MORPHINE SULFATE 2 MG/ML IJ SOLN: 1.0000 mg | INTRAMUSCULAR | Status: DC | PRN

## 2012-08-03 MED ORDER — HYDROCODONE-ACETAMINOPHEN 5-325 MG PO TABS
1.0000 | ORAL_TABLET | Freq: Once | ORAL | Status: AC
  Administered 2012-08-03: 1 via ORAL

## 2012-08-03 MED ORDER — LEVOTHYROXINE SODIUM 75 MCG PO TABS
75.0000 ug | ORAL_TABLET | Freq: Every day | ORAL | Status: DC
  Administered 2012-08-04 – 2012-08-06 (×3): 75 ug via ORAL
  Filled 2012-08-03 (×5): qty 1

## 2012-08-03 MED ORDER — HYDROMORPHONE HCL PF 1 MG/ML IJ SOLN: 0.2500 mg | INTRAMUSCULAR | Status: DC | PRN

## 2012-08-03 MED ORDER — ALBUMIN HUMAN 5 % IV SOLN
INTRAVENOUS | Status: DC | PRN
  Administered 2012-08-03 (×4): via INTRAVENOUS

## 2012-08-03 MED ORDER — ZOLPIDEM TARTRATE 5 MG PO TABS: 5.0000 mg | ORAL_TABLET | Freq: Every evening | ORAL | Status: DC | PRN

## 2012-08-03 MED ORDER — SENNOSIDES-DOCUSATE SODIUM 8.6-50 MG PO TABS
1.0000 | ORAL_TABLET | Freq: Every evening | ORAL | Status: DC | PRN
  Filled 2012-08-03: qty 1

## 2012-08-03 MED ORDER — CALCIUM CARBONATE-VITAMIN D 600-400 MG-UNIT PO TABS: 1.0000 | ORAL_TABLET | Freq: Every day | ORAL | Status: DC

## 2012-08-03 MED ORDER — METOPROLOL TARTRATE 25 MG PO TABS
25.0000 mg | ORAL_TABLET | Freq: Two times a day (BID) | ORAL | Status: DC
  Administered 2012-08-03 – 2012-08-06 (×6): 25 mg via ORAL
  Filled 2012-08-03 (×8): qty 1

## 2012-08-03 MED ORDER — ARTIFICIAL TEARS OP OINT
TOPICAL_OINTMENT | OPHTHALMIC | Status: DC | PRN
  Administered 2012-08-03: 1 via OPHTHALMIC

## 2012-08-03 MED ORDER — LACTATED RINGERS IV SOLN
INTRAVENOUS | Status: DC
  Administered 2012-08-03: 10:00:00 via INTRAVENOUS

## 2012-08-03 MED ORDER — SODIUM CHLORIDE 0.9 % IV SOLN
INTRAVENOUS | Status: DC | PRN
  Administered 2012-08-03: 17:00:00 via INTRAVENOUS

## 2012-08-03 MED ORDER — MENTHOL 3 MG MT LOZG
1.0000 | LOZENGE | OROMUCOSAL | Status: DC | PRN
  Filled 2012-08-03: qty 9

## 2012-08-03 MED ORDER — ALLOPURINOL 300 MG PO TABS
300.0000 mg | ORAL_TABLET | Freq: Every day | ORAL | Status: DC
  Administered 2012-08-03 – 2012-08-06 (×4): 300 mg via ORAL
  Filled 2012-08-03 (×4): qty 1

## 2012-08-03 MED ORDER — HYDROMORPHONE HCL PF 1 MG/ML IJ SOLN
INTRAMUSCULAR | Status: AC
  Administered 2012-08-03: 0.5 mg via INTRAVENOUS
  Filled 2012-08-03: qty 1

## 2012-08-03 MED ORDER — DIPHENHYDRAMINE HCL 12.5 MG/5ML PO ELIX
12.5000 mg | ORAL_SOLUTION | Freq: Four times a day (QID) | ORAL | Status: DC | PRN
  Filled 2012-08-03: qty 5

## 2012-08-03 MED ORDER — FLEET ENEMA 7-19 GM/118ML RE ENEM: 1.0000 | ENEMA | Freq: Once | RECTAL | Status: AC | PRN

## 2012-08-03 MED ORDER — ACETAMINOPHEN 650 MG RE SUPP: 650.0000 mg | RECTAL | Status: DC | PRN

## 2012-08-03 MED ORDER — PHENYLEPHRINE HCL 10 MG/ML IJ SOLN
10.0000 mg | INTRAVENOUS | Status: DC | PRN
  Administered 2012-08-03: 25 ug/min via INTRAVENOUS

## 2012-08-03 MED ORDER — HYDROCODONE-ACETAMINOPHEN 5-325 MG PO TABS
1.0000 | ORAL_TABLET | ORAL | Status: DC | PRN
  Administered 2012-08-04: 2 via ORAL
  Filled 2012-08-03: qty 2
  Filled 2012-08-03: qty 1

## 2012-08-03 MED ORDER — VITAMIN D3 25 MCG (1000 UNIT) PO TABS
2000.0000 [IU] | ORAL_TABLET | Freq: Two times a day (BID) | ORAL | Status: DC
  Administered 2012-08-03 – 2012-08-06 (×5): 2000 [IU] via ORAL
  Filled 2012-08-03 (×8): qty 2

## 2012-08-03 MED ORDER — PROMETHAZINE HCL 25 MG/ML IJ SOLN: 6.2500 mg | INTRAMUSCULAR | Status: DC | PRN

## 2012-08-03 MED ORDER — BACITRACIN ZINC 500 UNIT/GM EX OINT
TOPICAL_OINTMENT | CUTANEOUS | Status: AC
  Filled 2012-08-03: qty 15

## 2012-08-03 MED ORDER — BUPIVACAINE-EPINEPHRINE 0.25% -1:200000 IJ SOLN
INTRAMUSCULAR | Status: AC
  Filled 2012-08-03: qty 1

## 2012-08-03 MED ORDER — MINERAL OIL LIGHT 100 % EX OIL
TOPICAL_OIL | CUTANEOUS | Status: AC
  Filled 2012-08-03: qty 25

## 2012-08-03 MED ORDER — CEFAZOLIN SODIUM 1-5 GM-% IV SOLN
1.0000 g | Freq: Three times a day (TID) | INTRAVENOUS | Status: AC
  Administered 2012-08-03 – 2012-08-04 (×2): 1 g via INTRAVENOUS
  Filled 2012-08-03 (×2): qty 50

## 2012-08-03 MED ORDER — MORPHINE SULFATE (PF) 1 MG/ML IV SOLN
INTRAVENOUS | Status: AC
  Filled 2012-08-03: qty 25

## 2012-08-03 MED ORDER — PHENYLEPHRINE HCL 10 MG/ML IJ SOLN
INTRAMUSCULAR | Status: DC | PRN
  Administered 2012-08-03: 80 ug via INTRAVENOUS
  Administered 2012-08-03: 120 ug via INTRAVENOUS
  Administered 2012-08-03: 80 ug via INTRAVENOUS

## 2012-08-03 MED ORDER — HYDROCODONE-ACETAMINOPHEN 5-325 MG PO TABS
ORAL_TABLET | ORAL | Status: AC
  Filled 2012-08-03: qty 1

## 2012-08-03 MED ORDER — OXYCODONE-ACETAMINOPHEN 5-325 MG PO TABS
1.0000 | ORAL_TABLET | ORAL | Status: DC | PRN
  Administered 2012-08-04 – 2012-08-05 (×6): 2 via ORAL
  Administered 2012-08-06 (×2): 1 via ORAL
  Filled 2012-08-03 (×4): qty 2
  Filled 2012-08-03: qty 1
  Filled 2012-08-03 (×3): qty 2

## 2012-08-03 MED ORDER — ESMOLOL HCL 10 MG/ML IV SOLN
INTRAVENOUS | Status: DC | PRN
  Administered 2012-08-03: 20 mg via INTRAVENOUS

## 2012-08-03 MED ORDER — ONDANSETRON HCL 4 MG/2ML IJ SOLN: 4.0000 mg | INTRAMUSCULAR | Status: DC | PRN

## 2012-08-03 MED ORDER — FENTANYL CITRATE 0.05 MG/ML IJ SOLN
INTRAMUSCULAR | Status: DC | PRN
  Administered 2012-08-03: 100 ug via INTRAVENOUS
  Administered 2012-08-03: 50 ug via INTRAVENOUS
  Administered 2012-08-03: 100 ug via INTRAVENOUS
  Administered 2012-08-03 (×3): 50 ug via INTRAVENOUS

## 2012-08-03 MED ORDER — SODIUM CHLORIDE 0.9 % IV SOLN
INTRAVENOUS | Status: DC
  Administered 2012-08-03 – 2012-08-04 (×2): via INTRAVENOUS

## 2012-08-03 MED ORDER — THROMBIN 20000 UNITS EX SOLR
CUTANEOUS | Status: AC
  Filled 2012-08-03: qty 20000

## 2012-08-03 MED ORDER — BACITRACIN ZINC 500 UNIT/GM EX OINT
TOPICAL_OINTMENT | CUTANEOUS | Status: DC | PRN
  Administered 2012-08-03: 1 via TOPICAL

## 2012-08-03 MED ORDER — ONDANSETRON HCL 4 MG/2ML IJ SOLN
INTRAMUSCULAR | Status: DC | PRN
  Administered 2012-08-03: 4 mg via INTRAVENOUS

## 2012-08-03 MED ORDER — 0.9 % SODIUM CHLORIDE (POUR BTL) OPTIME
TOPICAL | Status: DC | PRN
  Administered 2012-08-03 (×3): 1000 mL

## 2012-08-03 MED ORDER — CITALOPRAM HYDROBROMIDE 20 MG PO TABS
20.0000 mg | ORAL_TABLET | Freq: Every day | ORAL | Status: DC
  Administered 2012-08-04 – 2012-08-06 (×3): 20 mg via ORAL
  Filled 2012-08-03 (×3): qty 1

## 2012-08-03 MED ORDER — BISACODYL 5 MG PO TBEC: 5.0000 mg | DELAYED_RELEASE_TABLET | Freq: Every day | ORAL | Status: DC | PRN

## 2012-08-03 MED ORDER — ALUM & MAG HYDROXIDE-SIMETH 200-200-20 MG/5ML PO SUSP: 30.0000 mL | Freq: Four times a day (QID) | ORAL | Status: DC | PRN

## 2012-08-03 MED ORDER — OXYBUTYNIN CHLORIDE 5 MG PO TABS
5.0000 mg | ORAL_TABLET | Freq: Two times a day (BID) | ORAL | Status: DC
  Administered 2012-08-03 – 2012-08-06 (×6): 5 mg via ORAL
  Filled 2012-08-03 (×8): qty 1

## 2012-08-03 MED ORDER — NALOXONE HCL 0.4 MG/ML IJ SOLN: 0.4000 mg | INTRAMUSCULAR | Status: DC | PRN

## 2012-08-03 MED ORDER — EPHEDRINE SULFATE 50 MG/ML IJ SOLN
INTRAMUSCULAR | Status: DC | PRN
  Administered 2012-08-03: 15 mg via INTRAVENOUS
  Administered 2012-08-03: 10 mg via INTRAVENOUS

## 2012-08-03 MED ORDER — THROMBIN 20000 UNITS EX SOLR
OROMUCOSAL | Status: DC | PRN
  Administered 2012-08-03: 12:00:00 via TOPICAL

## 2012-08-03 MED ORDER — SODIUM CHLORIDE 0.9 % IJ SOLN: 9.0000 mL | INTRAMUSCULAR | Status: DC | PRN

## 2012-08-03 MED ORDER — PHENOL 1.4 % MT LIQD
1.0000 | OROMUCOSAL | Status: DC | PRN
  Filled 2012-08-03: qty 177

## 2012-08-03 MED ORDER — DIAZEPAM 5 MG PO TABS
5.0000 mg | ORAL_TABLET | Freq: Four times a day (QID) | ORAL | Status: DC | PRN
  Administered 2012-08-04 – 2012-08-06 (×3): 5 mg via ORAL
  Filled 2012-08-03 (×3): qty 1

## 2012-08-03 MED ORDER — WHITE PETROLATUM GEL
Status: AC
  Administered 2012-08-03: 0.2
  Filled 2012-08-03: qty 5

## 2012-08-03 MED ORDER — MIDAZOLAM HCL 5 MG/5ML IJ SOLN
INTRAMUSCULAR | Status: DC | PRN
  Administered 2012-08-03 (×2): 1 mg via INTRAVENOUS

## 2012-08-03 MED ORDER — BUPIVACAINE-EPINEPHRINE 0.25% -1:200000 IJ SOLN
INTRAMUSCULAR | Status: DC | PRN
  Administered 2012-08-03: 5 mL

## 2012-08-03 MED ORDER — OXYCODONE HCL 5 MG PO TABS: 5.0000 mg | ORAL_TABLET | Freq: Once | ORAL | Status: DC | PRN

## 2012-08-03 MED ORDER — PROPOFOL 10 MG/ML IV BOLUS
INTRAVENOUS | Status: DC | PRN
  Administered 2012-08-03: 150 mg via INTRAVENOUS
  Administered 2012-08-03: 30 mg via INTRAVENOUS

## 2012-08-03 MED ORDER — LIDOCAINE HCL 4 % MT SOLN
OROMUCOSAL | Status: DC | PRN
  Administered 2012-08-03: 4 mL via TOPICAL

## 2012-08-03 MED ORDER — CALCIUM CARBONATE-VITAMIN D 500-200 MG-UNIT PO TABS
1.0000 | ORAL_TABLET | Freq: Every day | ORAL | Status: DC
  Administered 2012-08-03 – 2012-08-06 (×4): 1 via ORAL
  Filled 2012-08-03 (×4): qty 1

## 2012-08-03 SURGICAL SUPPLY — 67 items
BENZOIN TINCTURE PRP APPL 2/3 (GAUZE/BANDAGES/DRESSINGS) ×2 IMPLANT
BLADE CLIPPER SURG NEURO (BLADE) ×2 IMPLANT
BLADE LONG MED 31X9 (MISCELLANEOUS) ×2 IMPLANT
BUR NEURO DRILL SOFT 3.0X3.8M (BURR) ×2 IMPLANT
BUR PRESCISION 1.7 ELITE (BURR) ×2 IMPLANT
CLOTH BEACON ORANGE TIMEOUT ST (SAFETY) ×2 IMPLANT
CLSR STERI-STRIP ANTIMIC 1/2X4 (GAUZE/BANDAGES/DRESSINGS) ×2 IMPLANT
CONT SPEC STER OR (MISCELLANEOUS) ×2 IMPLANT
CORDS BIPOLAR (ELECTRODE) ×2 IMPLANT
COVER SURGICAL LIGHT HANDLE (MISCELLANEOUS) ×2 IMPLANT
DRAIN CHANNEL 15F RND FF W/TCR (WOUND CARE) IMPLANT
DRAPE C-ARM 42X72 X-RAY (DRAPES) ×2 IMPLANT
DRAPE INCISE IOBAN 66X45 STRL (DRAPES) ×4 IMPLANT
DRAPE PED LAPAROTOMY (DRAPES) ×2 IMPLANT
DRAPE POUCH INSTRU U-SHP 10X18 (DRAPES) ×2 IMPLANT
DRAPE PROXIMA HALF (DRAPES) ×10 IMPLANT
DRAPE SURG 17X23 STRL (DRAPES) ×16 IMPLANT
DRAPE TABLE COVER HEAVY DUTY (DRAPES) ×2 IMPLANT
DRSG MEPILEX BORDER 4X8 (GAUZE/BANDAGES/DRESSINGS) IMPLANT
DURAPREP 26ML APPLICATOR (WOUND CARE) ×2 IMPLANT
ELECT CAUTERY BLADE 6.4 (BLADE) ×2 IMPLANT
ELECT REM PT RETURN 9FT ADLT (ELECTROSURGICAL) ×2
ELECTRODE REM PT RTRN 9FT ADLT (ELECTROSURGICAL) ×1 IMPLANT
EVACUATOR SILICONE 100CC (DRAIN) IMPLANT
GAUZE SPONGE 4X4 16PLY XRAY LF (GAUZE/BANDAGES/DRESSINGS) ×2 IMPLANT
GLOVE BIO SURGEON STRL SZ7 (GLOVE) ×2 IMPLANT
GLOVE BIO SURGEON STRL SZ8 (GLOVE) ×2 IMPLANT
GLOVE BIOGEL PI IND STRL 8 (GLOVE) ×1 IMPLANT
GLOVE BIOGEL PI INDICATOR 8 (GLOVE) ×1
GOWN STRL NON-REIN LRG LVL3 (GOWN DISPOSABLE) ×8 IMPLANT
GOWN STRL REIN XL XLG (GOWN DISPOSABLE) ×2 IMPLANT
IV CATH 14GX2 1/4 (CATHETERS) ×2 IMPLANT
KIT BASIN OR (CUSTOM PROCEDURE TRAY) ×2 IMPLANT
KIT ROOM TURNOVER OR (KITS) ×2 IMPLANT
MOUNTAINEER TAP 3MM ×2 IMPLANT
Mountaineer Long Shank Screw 3.5 x 34mm (Screw) ×4 IMPLANT
NEEDLE HYPO 25GX1X1/2 BEV (NEEDLE) ×2 IMPLANT
NS IRRIG 1000ML POUR BTL (IV SOLUTION) ×2 IMPLANT
PACK LAMINECTOMY ORTHO (CUSTOM PROCEDURE TRAY) ×2 IMPLANT
PAD ARMBOARD 7.5X6 YLW CONV (MISCELLANEOUS) ×4 IMPLANT
PATTIES SURGICAL .5 X.5 (GAUZE/BANDAGES/DRESSINGS) ×6 IMPLANT
PATTIES SURGICAL .5 X1 (DISPOSABLE) ×2 IMPLANT
PIN MAYFIELD SKULL DISP (PIN) ×2 IMPLANT
ROD MOUNTAINEER 120MM (Rod) ×4 IMPLANT
SCREW INNER (Screw) ×24 IMPLANT
SPONGE GAUZE 4X4 12PLY (GAUZE/BANDAGES/DRESSINGS) ×2 IMPLANT
SPONGE INTESTINAL PEANUT (DISPOSABLE) ×2 IMPLANT
SPONGE SURGIFOAM ABS GEL 100 (HEMOSTASIS) ×2 IMPLANT
STRIP CLOSURE SKIN 1/2X4 (GAUZE/BANDAGES/DRESSINGS) IMPLANT
SURGIFLO TRUKIT (HEMOSTASIS) IMPLANT
SUT FIBERWIRE #2 38 T-5 BLUE (SUTURE) ×4
SUT MNCRL AB 4-0 PS2 18 (SUTURE) ×2 IMPLANT
SUT VIC AB 0 CT1 18XCR BRD 8 (SUTURE) ×1 IMPLANT
SUT VIC AB 0 CT1 8-18 (SUTURE) ×1
SUT VIC AB 1 CT1 18XCR BRD 8 (SUTURE) ×2 IMPLANT
SUT VIC AB 1 CT1 8-18 (SUTURE) ×2
SUT VIC AB 2-0 CT2 18 VCP726D (SUTURE) ×2 IMPLANT
SUTURE FIBERWR #2 38 T-5 BLUE (SUTURE) ×2 IMPLANT
SYR BULB IRRIGATION 50ML (SYRINGE) ×2 IMPLANT
SYR CONTROL 10ML LL (SYRINGE) ×2 IMPLANT
TAPE CLOTH 4X10 WHT NS (GAUZE/BANDAGES/DRESSINGS) ×2 IMPLANT
TAPE CLOTH SURG 4X10 WHT LF (GAUZE/BANDAGES/DRESSINGS) ×2 IMPLANT
TOWEL OR 17X24 6PK STRL BLUE (TOWEL DISPOSABLE) ×2 IMPLANT
TOWEL OR 17X26 10 PK STRL BLUE (TOWEL DISPOSABLE) ×2 IMPLANT
TRAY FOLEY CATH 14FR (SET/KITS/TRAYS/PACK) ×2 IMPLANT
WATER STERILE IRR 1000ML POUR (IV SOLUTION) ×2 IMPLANT
YANKAUER SUCT BULB TIP NO VENT (SUCTIONS) ×2 IMPLANT

## 2012-08-03 NOTE — Anesthesia Postprocedure Evaluation (Signed)
  Anesthesia Post-op Note  Patient: Engineer, drilling  Procedure(s) Performed: Procedure(s) with comments: POSTERIOR CERVICAL FUSION/FORAMINOTOMY LEVEL 2 (N/A) - Posterior cervical fusion, cerival 1-2, cervical 2-3 with instrumentation, iliac crest autograft  Patient Location: PACU  Anesthesia Type:General  Level of Consciousness: awake and alert   Airway and Oxygen Therapy: Patient Spontanous Breathing  Post-op Pain: mild  Post-op Assessment: Post-op Vital signs reviewed, Patient's Cardiovascular Status Stable, Respiratory Function Stable, Patent Airway, No signs of Nausea or vomiting and Pain level controlled  Post-op Vital Signs: stable  Complications: No apparent anesthesia complications

## 2012-08-03 NOTE — Anesthesia Preprocedure Evaluation (Addendum)
Anesthesia Evaluation  Patient identified by MRN, date of birth, ID band Patient awake    Reviewed: Allergy & Precautions, H&P , NPO status , Patient's Chart, lab work & pertinent test results  History of Anesthesia Complications Negative for: history of anesthetic complications  Airway Mallampati: III TM Distance: >3 FB Neck ROM: Full    Dental  (+) Teeth Intact, Dental Advisory Given and Caps   Pulmonary neg pulmonary ROS,    Pulmonary exam normal       Cardiovascular hypertension, Pt. on home beta blockers and Pt. on medications + Peripheral Vascular Disease     Neuro/Psych PSYCHIATRIC DISORDERS Anxiety negative neurological ROS     GI/Hepatic negative GI ROS, Neg liver ROS,   Endo/Other  Hypothyroidism Morbid obesity  Renal/GU Renal InsufficiencyRenal disease     Musculoskeletal   Abdominal   Peds  Hematology   Anesthesia Other Findings   Reproductive/Obstetrics                          Anesthesia Physical Anesthesia Plan  ASA: III  Anesthesia Plan: General   Post-op Pain Management:    Induction: Intravenous  Airway Management Planned: Video Laryngoscope Planned and Fiberoptic Intubation Planned  Additional Equipment:   Intra-op Plan:   Post-operative Plan: Extubation in OR  Informed Consent: I have reviewed the patients History and Physical, chart, labs and discussed the procedure including the risks, benefits and alternatives for the proposed anesthesia with the patient or authorized representative who has indicated his/her understanding and acceptance.   Dental advisory given  Plan Discussed with: CRNA, Anesthesiologist and Surgeon  Anesthesia Plan Comments: (Discussed airway management with Dr. Yevette Edwards.  He feels we can intubate while asleep because there is no evidence for cord compression.  He does not require her to have the C-Collar on for intubation. He does not  require a neuro check after intubation.  Will plan glidescope while asleep.)       Anesthesia Quick Evaluation

## 2012-08-03 NOTE — Preoperative (Signed)
Beta Blockers   Reason not to administer Beta Blockers:Not Applicable 

## 2012-08-03 NOTE — Transfer of Care (Signed)
Immediate Anesthesia Transfer of Care Note  Patient: Engineer, drilling  Procedure(s) Performed: Procedure(s) with comments: POSTERIOR CERVICAL FUSION/FORAMINOTOMY LEVEL 2 (N/A) - Posterior cervical fusion, cerival 1-2, cervical 2-3 with instrumentation, iliac crest autograft  Patient Location: PACU  Anesthesia Type:General  Level of Consciousness: awake, alert  and oriented  Airway & Oxygen Therapy: Patient Spontanous Breathing and Patient connected to face mask oxygen  Post-op Assessment: Report given to PACU RN, Post -op Vital signs reviewed and stable and Patient moving all extremities X 4  Post vital signs: Reviewed and stable  Complications: No apparent anesthesia complications

## 2012-08-03 NOTE — H&P (Signed)
PREOPERATIVE H&P  Chief Complaint: neck pain  HPI: Stacey Cooley is a 72 y.o. 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 HTN (hypertension)   . Morbid obesity   . Rheumatic fever     child  . Dyslipidemia   . Swelling of joint of lower leg     recurrent lower extremity leg swelling, noted by pt  . Anemia   . Hypothyroid   . Varicose vein of leg   . MRSA (methicillin resistant staph aureus) culture positive   . TACHYCARDIA 10/17/2009    Qualifier: Diagnosis of  By: Mariah Milling MD, Tim    . Anxiety   . Cough   . Heart murmur   . Renal insufficiency 09/06/2011    DR. HA  [facility]   . Uterine cancer 1996    s/p hysterectomy; patient reported low stage; did not need adjuvant chemo  or radiation.   . DDD (degenerative disc disease), cervical   . 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 Anterior cervical decompression and fusion  09/2009    involving C4-7 levels as well as a posterior cervical fusion prcedure from C3 to T  . Tonsillectomy  1966  . Gastric stapling  12/1984 and 2001  . Abdominal hysterectomy  1996  . Right hip replacement  1997  . Right ulnar nerve decompression  2005  . Right carpal tunnel release  2005  . Total left knee replacement  2006  . Halo application  05/03/2012    Procedure: HALO TRACTION APPLICATION;  Surgeon: Emilee Hero, MD;  Location: [facility] OR;  Service: Orthopedics;  Laterality: N/A;  . Joint replacement      RIGHT KNEE   . No past surgeries      RIGHT THUMB   . Ulnar nerve repair      RIGHT ARM 1.5 YRS AGO   . 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 Marital Status: Married    Spouse Name: N/A    Number of Children: N/A  .  Years of Education: N/A   Social History Main Topics  . Smoking status: Former Smoker -- 0.25 packs/day for 10 years    Quit date: 05/25/1991  . Smokeless tobacco: Not on file     Comment: no smoking  . Alcohol Use: No  . Drug Use: No  . Sexually Active: No   Other Topics Concern  . Not on file   Social History Narrative   Lives with husband.    Family History  Problem Relation Age of Onset  . Coronary artery disease Neg Hx   . Cancer Mother     lung cancer  . Liver disease Father   . Anemia Sister    Allergies  Allergen Reactions  . Latex Itching and Rash  . Penicillins Rash  . Sulfonamide Derivatives Rash   Prior to Admission medications   Medication Sig Start Date End Date Taking? Authorizing Provider  allopurinol (ZYLOPRIM) 300 MG tablet Take 300 mg by mouth daily.   Yes Historical Provider, MD  aspirin EC 81 MG tablet Take 81 mg  by mouth daily.   Yes Historical Provider, MD  Calcium Carbonate-Vitamin D (CALCIUM 600+D) 600-400 MG-UNIT per tablet Take 1 tablet by mouth daily.   Yes Historical Provider, MD  Cholecalciferol (VITAMIN D) 2000 UNITS CAPS Take 1 capsule by mouth 2 (two) times daily.   Yes Historical Provider, MD  citalopram (CELEXA) 20 MG tablet Take 20 mg by mouth daily.   Yes Historical Provider, MD  Cyanocobalamin (VITAMIN B-12 SL) Place 1 tablet under the tongue daily.   Yes Historical Provider, MD  cyclobenzaprine (FLEXERIL) 10 MG tablet Take 10 mg by mouth daily.   Yes Historical Provider, MD  fluticasone (FLONASE) 50 MCG/ACT nasal spray Place 3 sprays into the nose daily.   Yes Historical Provider, MD  HYDROcodone-acetaminophen (NORCO/VICODIN) 5-325 MG per tablet Take 1 tablet by mouth 2 (two) times daily as needed for pain. 05/08/12  Yes Jodi Marble, MD  IRON PO Take 1 tablet by mouth daily.   Yes Historical Provider, MD  levothyroxine (SYNTHROID, LEVOTHROID) 75 MCG tablet Take 75 mcg by mouth daily.    Yes Historical Provider, MD  metoprolol  tartrate (LOPRESSOR) 25 MG tablet Take 25 mg by mouth 2 (two) times daily.     Yes Historical Provider, MD  Multiple Vitamin (MULTIVITAMIN WITH MINERALS) TABS Take 1 tablet by mouth daily. Centrum   Yes Historical Provider, MD  oxybutynin (DITROPAN) 5 MG tablet Take 5 mg by mouth Daily. 07/28/11  Yes Historical Provider, MD  Potassium Gluconate 550 MG TABS Take 1 tablet by mouth daily.   Yes Historical Provider, MD     All other systems have been reviewed and were otherwise negative with the exception of those mentioned in the HPI and as above.  Physical Exam: There were no vitals filed for this visit.  General: Alert, no acute distress Cardiovascular: No pedal edema Respiratory: No cyanosis, no use of accessory musculature Skin: No lesions in the area of chief complaint Neurologic: Sensation intact distally Psychiatric: Patient is competent for consent with normal mood and affect Lymphatic: No axillary or cervical lymphadenopathy  MUSCULOSKELETAL: pain with cervical ROM  Assessment/Plan: C2 fracture Plan for Procedure(s): POSTERIOR CERVICAL FUSION C1-T1   Emilee Hero, MD 08/03/2012 7:22 AM

## 2012-08-03 NOTE — Anesthesia Procedure Notes (Signed)
Procedure Name: Intubation Date/Time: 08/03/2012 10:45 AM Performed by: Gayla Medicus Pre-anesthesia Checklist: Patient identified, Timeout performed, Emergency Drugs available, Suction available and Patient being monitored Patient Re-evaluated:Patient Re-evaluated prior to inductionOxygen Delivery Method: Circle system utilized Preoxygenation: Pre-oxygenation with 100% oxygen Intubation Type: IV induction Grade View: Grade I Tube type: Oral Tube size: 7.5 mm Number of attempts: 1 Airway Equipment and Method: Stylet and Video-laryngoscopy Placement Confirmation: ETT inserted through vocal cords under direct vision,  positive ETCO2 and breath sounds checked- equal and bilateral Secured at: 22 cm Tube secured with: Tape Dental Injury: Teeth and Oropharynx as per pre-operative assessment  Difficulty Due To: Difficulty was anticipated and Difficult Airway- due to cervical collar

## 2012-08-04 ENCOUNTER — Encounter (HOSPITAL_COMMUNITY): Payer: Self-pay

## 2012-08-04 NOTE — Progress Notes (Signed)
Patient comfortable. Minimal neck pain low back pain.  BP 109/66  Pulse 74  Temp(Src) 98 F (36.7 C) (Oral)  Resp 16  Ht 5\' 2"  (1.575 m)  Wt 122 kg (268 lb 15.4 oz)  BMI 49.18 kg/m2  SpO2 99%  Urine output adequate  NVI Dressing CDI  POD #1 after extension of fusion to C1 for dens nonunion  - up today with PT/OT - OK to transfer to 5N today - d/c PCA - percocet/valium - d/c foley

## 2012-08-04 NOTE — Op Note (Signed)
NAMEMarland Kitchen  Stacey Cooley, Stacey Cooley NO.:  [id number]  MEDICAL RECORD NO.:  [id number]  LOCATION:  3311                         FACILITY:  [facility]  PHYSICIAN:  Estill Bamberg, MD      DATE OF BIRTH:  10/05/1940  DATE OF PROCEDURE:  08/03/2012                              OPERATIVE REPORT   PREOPERATIVE DIAGNOSIS:  Odontoid nonunion.  POSTOPERATIVE DIAGNOSIS:  Odontoid nonunion.  PROCEDURES: 1. Atlantoaxial arthrodesis. 2. C2-3 arthrodesis. 3. Extension and fusion from C3 to T1 up to the C1 level. 4. Placement of posterior instrumentation from C1 to T1. 5. Harvesting of structural iliac crest autograft using a separate     incision over the patient's left iliac crest. 6. Harvesting of morselized autograft from the patient's left iliac     crest. 7. Cranial-tong application and removal. 8. Intraoperative use of fluoroscopy.  SURGEON:  Estill Bamberg, MD  ASSISTANT:  Jason Coop, [facility]  COMPLICATIONS:  None.  DISPOSITION:  Stable.  ESTIMATED BLOOD LOSS:  350 mL.  INDICATIONS FOR PROCEDURE:  Briefly, Stacey Cooley is a pleasant 72 year old female who is status post a C3 to T1 posterior spinal fusion by me. The patient did extremely well after her procedure and was essentially pain free.  However, approximately 3 months ago, the patient did sustain a fall.  As result of the fall, she did sustain a Jefferson fracture involving the C1 ring in addition to type 2 odontoid fracture.  Given the C1 and C2 fractures, the patient was placed into a halo, with the goal of getting all fractures to heal.  However, after 3 months of being treated in a halo, a flexion-extension CAT scan was obtained and was notable for nonunion, as there was motion across the odontoid.  Given the significant risks associated with an odontoid nonunion, we did decide to go forward with an extension of her fusion up to the C1 level and placement of posterior instrumentation and iliac crest  autograft harvesting.  The patient fully understood the risks and limitations of the procedure as outlined in my preoperative note.  Of particular note, the patient did understand that if she were to not go on to heal, an additional procedure may be indicated with an extension of her fusion up to the occiput.  OPERATIVE DETAILS:  On August 03, 2012, the patient was brought to the surgery and general endotracheal anesthesia was administered.  A cranial- tong was applied to the patient's head and the patient was rolled prone onto a hospital bed.  The patient was placed into reverse Trendelenburg. All bony prominences were meticulously padded.  The area of the ulnar nerve was meticulously padded as well.  A 1 g of vancomycin was given for antibiotic prophylaxis.  The posterior neck was then prepped and draped in usual sterile fashion.  I then utilized the patient's previous incision which was extended more cephalad.  The posterior cervical spine was readily identified, as was the posterior cervical hardware.  I did subperiosteally expose the lamina of C2 in addition to the posterior arch of C1.  I did remove the right and the left caps and rods.  I did subperiosteally expose the C2-3 facet joints.  I then swept across the under surface of the C1 arch.  I was able to identify the superior aspect of the C1 lateral mass.  I did use a 1.7-mm burr to gain entry to the lateral mass.  I then used a 3-mm tap and the tap was advanced across the lateral mass towards the anterior aspect of the C1 arch.  I did use AP and lateral fluoroscopy while advancing the tap.  This was done on both right and the left sides.  A 3.5 x 34 mm screw was placed on both right and the left sides, with 24 mm in the lateral mass.  A 10 mm of the screw was nonthreaded, so as to decrease any irritation of the C2 nerve.  At this point, the wound was copiously irrigated and I made a separate incision over the patient's left  iliac crest.  A tricortical piece of iliac crest was harvested from the patient's left iliac crest. I did also obtain morselized autograft from the iliac crest.  The region of the iliac crest was then irrigated copiously.  The fascia was then closed using #1 Vicryl and the subcutaneous layer was closed using 2-0 Vicryl and the skin was closed using 3-0 Monocryl.  I then turned my attention towards the patient's cervical spine once again.  I then fashioned a rod on both the right and the left sides to extend from C1 down to T1.  Again, the rod was contoured in the appropriate fashion on both right and left side and was cut to the appropriate length.  I then put the rods on the back table.  I then used a 3-mm burr to decorticate the C2-3 facet joint.  In addition, I did decorticate the superior aspect of C2 and the inferior aspect of the C1 arch.  Of note, it was apparent that there was in fact a successful union of the C1 arch, as there was abnormal motion identified across the C1 arch.  I then fashioned the cortical iliac crest autograft to the appropriate shape and this was placed in a press-fit fashion between the C1 arch and the superior aspect of the C2 spinous process and lamina.  Morselized autograft was packed above and below the structural piece of autograft as well.  Autograft was also packed into the C2-3 facet joint on both the right and the left sides.  At this point, the rod was placed on the right and left side and caps were placed.  The final locking procedure was performed.  I was very pleased with the final appearance on both AP and lateral fluoroscopic images.  At this point, the wound was again copiously irrigated.  The fascia was then closed using #1 Vicryl, the subcutaneous layer was closed using 2-0 Vicryl, and the skin was closed using 2-0 nylon.  Antibiotic ointment was placed over the wound, as was a sterile dressing.  At this point, the patient was then rolled  supine, and the [place] head holder was removed.  Of note, prior to placing the rods, I did position the head into the appropriate degree of lordosis, to restore anatomic alignment of C1 and C2.  All instrument counts were correct at the termination of the procedure.  Of note, Jason Coop was my assistant throughout the entirety of the procedure and aided in essential retraction and suctioning throughout the entirety of the surgery.     Estill Bamberg, MD     MD/MEDQ  D:  08/03/2012  T:  08/04/2012  Job:  [id number]  cc:   Yates Decamp, MD

## 2012-08-04 NOTE — Progress Notes (Signed)
UR COMPLETED  

## 2012-08-04 NOTE — Progress Notes (Signed)
Patient being transferred to 5N bed 11 via bed. Phone report called to [facility]. Patient and wife aware of the transfer.

## 2012-08-04 NOTE — Evaluation (Signed)
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  She presents with decreased independence due to acute pain, decreased balance and decreased activity tolerance and will benefit from skilled PT in the acute setting to maximize independence and allow d/c home with spouse assist and HHPT.    PT Assessment  Patient needs continued PT services    Follow Up Recommendations  Home health PT;Supervision/Assistance - 24 hour    Does the patient have the potential to tolerate intense rehabilitation    N/A  Barriers to Discharge None      Equipment Recommendations  None recommended by PT    Recommendations for Other Services   None  Frequency Min 6X/week    Precautions / Restrictions Precautions Precautions: Fall Required Braces or Orthoses: Cervical Brace Cervical Brace: Hard collar   Pertinent Vitals/Pain 5/10 left hip and neck      Mobility  Bed Mobility Bed Mobility: Rolling Right;Right Sidelying to Sit Rolling Right: 4: Min assist;With rail Right Sidelying to Sit: 4: Min assist;HOB elevated;With rails Details for Bed Mobility Assistance: cues for technique Transfers Transfers: Sit to Stand;Stand to Sit Sit to Stand: 5: Supervision;From bed;With upper extremity assist Stand to Sit: To bed;To chair/3-in-1;5: Supervision;With upper extremity assist Details for Transfer Assistance: cues for backing to chair and reaching for armrests; pt states has been doing it for some time Ambulation/Gait Ambulation/Gait Assistance: 4: Min guard Ambulation Distance (Feet): 160 Feet Assistive device: Rolling walker Ambulation/Gait Assistance Details: right hip higher, c/o left hip pain due to graft; cues for decreased  weight with UE assist on walker to increase tolerance.  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 Gait Pattern: Step-to pattern;Antalgic;Decreased stride length;Wide base of support        PT Diagnosis: Difficulty walking;Acute pain  PT Problem List: Decreased activity tolerance;Decreased range of motion;Pain;Decreased balance;Decreased mobility PT Treatment Interventions: Gait training;DME instruction;Functional mobility training;Therapeutic activities;Therapeutic exercise;Patient/family education;Balance training   PT Goals Acute Rehab PT Goals PT Goal Formulation: With patient Time For Goal Achievement: 08/11/12 Potential to Achieve Goals: Good Pt will Roll Supine to Right Side: with modified independence PT Goal: Rolling Supine to Right Side - Progress: Goal set today Pt will go Supine/Side to Sit: with modified independence PT Goal: Supine/Side to Sit - Progress: Goal set today Pt will go Sit to Supine/Side: with modified independence PT Goal: Sit to Supine/Side - Progress: Goal set today Pt will go Sit to Stand: with modified independence PT Goal: Sit to Stand - Progress: Goal set today Pt will go Stand to Sit: with modified independence PT Goal: Stand to Sit - Progress: Goal set today Pt will Ambulate: >150 feet;with modified independence;with rolling walker PT Goal: Ambulate - Progress: Goal set today  Visit Information  Last PT Received On: 08/04/12 Assistance Needed: +1    Subjective Data  Subjective: I was in the halo 3 months, having neck muscle spasms. Patient Stated Goal: To return to independent   Prior Functioning  Home Living Lives With: Spouse Available Help at Discharge: Family;Available 24 hours/day Type of Home: House Home Access: Ramped entrance Home Layout: One level Bathroom Shower/Tub: Health visitor: Handicapped height Home Adaptive Equipment: Straight cane;Walker - rolling;Shower chair without back Prior Function Level of Independence:  Independent with assistive device(s) Driving: No (  was before halo) Vocation: Retired Musician: No difficulties    Copywriter, advertising Overall Cognitive Status: Appears within functional limits for tasks assessed/performed Arousal/Alertness: Awake/alert Orientation Level: Appears intact for tasks assessed Behavior During Session: [facility] for tasks performed    Extremity/Trunk Assessment Right Upper Extremity Assessment RUE ROM/Strength/Tone: [facility] for tasks assessed Left Upper Extremity Assessment LUE ROM/Strength/Tone: WFL for tasks assessed Right Lower Extremity Assessment RLE ROM/Strength/Tone: WFL for tasks assessed RLE Sensation: WFL - Light Touch Left Lower Extremity Assessment LLE ROM/Strength/Tone: WFL for tasks assessed LLE Sensation: WFL - Light Touch   Balance Balance Balance Assessed: Yes Static Sitting Balance Static Sitting - Balance Support: Feet unsupported;Right upper extremity supported Static Sitting - Level of Assistance: 6: Modified independent (Device/Increase time) Static Sitting - Comment/# of Minutes: sitting few moments while trying to get lines, etc arranged Static Standing Balance Static Standing - Balance Support: Bilateral upper extremity supported Static Standing - Level of Assistance: 5: Stand by assistance Static Standing - Comment/# of Minutes: standing with walker, again arranging lines for safety  End of Session PT - End of Session Equipment Utilized During Treatment: Gait belt;Cervical collar Activity Tolerance: Patient tolerated treatment well Patient left: in chair;with call bell/phone within reach  GP     [facility] 08/04/2012, 10:36 AM Sheran Lawless, PT [phone] 08/04/2012

## 2012-08-05 NOTE — Progress Notes (Signed)
Patient looks excellent. Pain well controlled.  BP 144/71  Pulse 90  Temp(Src) 98.4 F (36.9 C) (Oral)  Resp 18  Ht 5\' 2"  (1.575 m)  Wt 122 kg (268 lb 15.4 oz)  BMI 49.18 kg/m2  SpO2 98%  NVI Dressing CDI Collar in place  POD #2 after extension of fusion to C1  - cont PT/OT - pain control - likely d/c home tomorrow - SCDs

## 2012-08-05 NOTE — Progress Notes (Signed)
Physical Therapy Treatment Patient Details Name: Stacey Cooley MRN: [id number] DOB: [birth year] Today's Date: 08/05/2012 Time: 4098-1191 PT Time Calculation (min): 24 min  PT Assessment / Plan / Recommendation Comments on Treatment Session  Pt much improved today.  Husband present for entire session. Both feel ready for DC to home tomorrow.  Answered questions and reviewed cervical precautions.      Follow Up Recommendations  Home health PT;Supervision/Assistance - 24 hour     Does the patient have the potential to tolerate intense rehabilitation     Barriers to Discharge        Equipment Recommendations  None recommended by PT    Recommendations for Other Services    Frequency Min 6X/week   Plan Discharge plan remains appropriate    Precautions / Restrictions Precautions Precautions: Cervical Required Braces or Orthoses: Cervical Brace Cervical Brace: Hard collar Restrictions Weight Bearing Restrictions: No   Pertinent Vitals/Pain 6/10 L hip pain with ambulation.      Mobility  Bed Mobility Bed Mobility: Not assessed Details for Bed Mobility Assistance: pt has Cooley bed at home Transfers Transfers: Sit to Stand;Stand to Sit Sit to Stand: 5: Supervision;From chair/3-in-1 Stand to Sit: 5: Supervision;To chair/3-in-1 Details for Transfer Assistance: vc for cervical precautions Ambulation/Gait Ambulation/Gait Assistance: 5: Supervision Ambulation Distance (Feet): 250 Feet + Assistive device: Rolling walker Gait Pattern: Step-through pattern;Wide base of support General Gait Details: Gait much improved today.  More fluid gait pattern.   Stairs: No    Exercises     PT Diagnosis:    PT Problem List:   PT Treatment Interventions:     PT Goals Acute Rehab PT Goals Pt will Roll Supine to Right Side: with modified independence PT Goal: Rolling Supine to Right Side - Progress: Other (comment) (NT today) Pt will go Supine/Side to Sit: with modified independence PT  Goal: Supine/Side to Sit - Progress: Other (comment) (NT today) Pt will go Sit to Supine/Side: with modified independence PT Goal: Sit to Supine/Side - Progress: Other (comment) (NT) Pt will go Sit to Stand: with modified independence PT Goal: Sit to Stand - Progress: Progressing toward goal Pt will go Stand to Sit: with modified independence PT Goal: Stand to Sit - Progress: Progressing toward goal Pt will Ambulate: >150 feet;with modified independence;with rolling walker PT Goal: Ambulate - Progress: Progressing toward goal  Visit Information  Last PT Received On: 08/05/12 Assistance Needed: +1    Subjective Data      Cognition  Cognition Overall Cognitive Status: Appears within functional limits for tasks assessed/performed Arousal/Alertness: Awake/alert Orientation Level: Appears intact for tasks assessed Behavior During Session: Stacey Cooley for tasks performed    Balance  Balance Balance Assessed: Yes (WFL for ADL)  End of Session PT - End of Session Equipment Utilized During Treatment: Gait belt;Cervical collar Activity Tolerance: Patient tolerated treatment well Patient left: in chair;with call bell/phone within reach;with family/visitor present   GP     Stacey Cooley 08/05/2012, 1:40 PM Stacey Cooley, PT  [phone] 08/05/2012

## 2012-08-05 NOTE — Progress Notes (Signed)
Occupational Therapy Evaluation Patient Details Name: Stacey Cooley MRN: [id number] DOB: Nov 02, 1940 Today's Date: 08/05/2012 Time: 9562-1308 OT Time Calculation (min): 20 min  OT Assessment / Plan / Recommendation Clinical Impression  71 yo s/p cervical fusion secondary to C2 nonhealing fx. hard collar at all times. Has shower collar. Pt making excellent progress and is appropriate for D/C tomorrow. Will have 24/7 S from husband. Has all nec DME and AE.  All further OT to be addressed by HHOT.    OT Assessment  All further OT needs can be met in the next venue of care    Follow Up Recommendations  Home health OT    Barriers to Discharge  none    Equipment Recommendations  None recommended by OT    Recommendations for Other Services    Frequency    eval only   Precautions / Restrictions Precautions Precautions: Cervical Required Braces or Orthoses: Cervical Brace Cervical Brace: Hard collar (at all times) Restrictions Weight Bearing Restrictions: No   Pertinent Vitals/Pain no apparent distress     ADL  Grooming: Minimal assistance Upper Body Bathing: Minimal assistance Lower Body Bathing: Moderate assistance Upper Body Dressing: Minimal assistance Lower Body Dressing: Moderate assistance Toilet Transfer: Supervision/safety Toileting - Clothing Manipulation and Hygiene: Supervision/safety Transfers/Ambulation Related to ADLs: s RW level ADL Comments: HAs AE she uses for ADL    OT Diagnosis: Generalized weakness;Acute pain  OT Problem List: Decreased strength;Decreased range of motion;Decreased knowledge of use of DME or AE;Decreased knowledge of precautions;Pain OT Treatment Interventions:     OT Goals Acute Rehab OT Goals OT Goal Formulation:  (eval oly)  Visit Information  Last OT Received On: 08/05/12 Assistance Needed: +1    Subjective Data      Prior Functioning     Home Living Lives With: Spouse Available Help at Discharge: Family;Available 24  hours/day Type of Home: House Home Access: Ramped entrance Home Layout: One level Bathroom Shower/Tub: Health visitor: Handicapped height Bathroom Accessibility: Yes How Accessible: Accessible via walker Home Adaptive Equipment: Straight cane;Walker - rolling;Shower chair without back;Reacher;Long-handled sponge;Long-handled shoehorn;Sock aid Prior Function Level of Independence: Independent with assistive device(s) Driving: No (was before halo) Vocation: Retired Musician: No difficulties         Vision/Perception     Copywriter, advertising Overall Cognitive Status: Appears within functional limits for tasks assessed/performed    Extremity/Trunk Assessment Right Upper Extremity Assessment RUE ROM/Strength/Tone: WFL for tasks assessed Left Upper Extremity Assessment LUE ROM/Strength/Tone: WFL for tasks assessed     Mobility Bed Mobility Bed Mobility: Not assessed Details for Bed Mobility Assistance: pt has hospital bed at home Transfers Transfers: Sit to Stand;Stand to Sit Sit to Stand: 5: Supervision;From chair/3-in-1 Stand to Sit: 5: Supervision;To chair/3-in-1 Details for Transfer Assistance: vc for cervical precautions     Exercise     Balance Balance Balance Assessed: Yes (WFL for ADL)   End of Session OT - End of Session Equipment Utilized During Treatment: Gait belt Activity Tolerance: Patient tolerated treatment well Patient left: in chair;with call bell/phone within reach;with family/visitor present Nurse Communication: Mobility status  GO     Crandall Harvel,HILLARY 08/05/2012, 11:54 AM Luisa Dago, OTR/L  [phone] 08/05/2012

## 2012-08-06 NOTE — Progress Notes (Signed)
Patient looks wonderful, in good spirits and eager to head home. Pain well controlled on percocet/valium  BP 129/74  Pulse 82  Temp(Src) 98.6 F (37 C) (Oral)  Resp 18  Ht 5\' 2"  (1.575 m)  Wt 122 kg (268 lb 15.4 oz)  BMI 49.18 kg/m2  SpO2 96% NVI  Dressing CDI  Collar in place/fitting appropriately  POD #3 after extension of fusion to C1   - Complete PT/OT   - pain control with valium and percocet, prescriptions in the chart  - d/c home today with husband  - SCDs

## 2012-08-07 MED FILL — Heparin Sodium (Porcine) Inj 1000 Unit/ML: INTRAMUSCULAR | Qty: 30 | Status: AC

## 2012-08-07 MED FILL — Sodium Chloride IV Soln 0.9%: INTRAVENOUS | Qty: 3000 | Status: AC

## 2012-08-08 ENCOUNTER — Encounter (HOSPITAL_COMMUNITY): Payer: Self-pay | Admitting: Orthopedic Surgery

## 2012-08-22 NOTE — Discharge Summary (Signed)
Patient ID: Stacey Cooley MRN: [id number] DOB/AGE: 01/09/1941 72 y.o.  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 HTN (hypertension)   . Morbid obesity   . Rheumatic fever     child  . Dyslipidemia   . Swelling of joint of lower leg     recurrent lower extremity leg swelling, noted by pt  . Anemia   . Hypothyroid   . Varicose vein of leg   . MRSA (methicillin resistant staph aureus) culture positive   . TACHYCARDIA 10/17/2009    Qualifier: Diagnosis of  By: Mariah Milling MD, Tim    . Anxiety   . Cough   . Heart murmur   . Renal insufficiency 09/06/2011    DR. HA  [facility]   . Uterine cancer 1996    s/p hysterectomy; patient reported low stage; did not need adjuvant chemo  or radiation.   . DDD (degenerative disc disease), cervical   . 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 female who was admitted 08/03/2012 for operative treatment of C1 Non-union. Patient has severe unremitting pain that affects sleep, daily activities, and work/hobbies. After pre-op clearance the patient was taken to the operating room on 08/03/2012 and underwent  Procedure(s): POSTERIOR CERVICAL FUSION/FORAMINOTOMY LEVEL 2, extension of fusion to C1.    Patient was given perioperative antibiotics:  Anti-infectives   Start     Dose/Rate Route Frequency Ordered Stop   08/03/12 2200  ceFAZolin (ANCEF) IVPB 1 g/50 mL premix     1 g 100 mL/hr over 30 Minutes Intravenous Every 8 hours 08/03/12 2125 08/04/12 0704   08/03/12 0800  vancomycin (VANCOCIN)  1,500 mg in sodium chloride 0.9 % 500 mL IVPB     1,500 mg 250 mL/hr over 120 Minutes Intravenous 60 min pre-op 08/02/12 1426 08/03/12 1035       Patient was given sequential compression devices, early ambulation, and chemoprophylaxis to prevent DVT.  Patient benefited maximally from hospital stay and there were no complications.    Recent vital signs: BP 129/74  Pulse 82  Temp(Src) 98.6 F (37 C) (Oral)  Resp 18  Ht 5\' 2"  (1.575 m)  Wt 122 kg (268 lb 15.4 oz)  BMI 49.18 kg/m2  SpO2 96%    Discharge Medications:     Medication List    STOP taking these medications       aspirin EC 81 MG tablet     cyclobenzaprine 10 MG tablet  Commonly known as:  FLEXERIL      TAKE these medications       allopurinol 300 MG tablet  Commonly known as:  ZYLOPRIM  Take 300 mg by mouth daily.     CALCIUM 600+D 600-400 MG-UNIT per tablet  Generic drug:  Calcium Carbonate-Vitamin D  Take 1 tablet by mouth daily.     citalopram 20 MG tablet  Commonly known as:  CELEXA  Take 20 mg by mouth daily.     fluticasone 50 MCG/ACT nasal spray  Commonly known as:  FLONASE  Place 3 sprays into  the nose daily.     HYDROcodone-acetaminophen 5-325 MG per tablet  Commonly known as:  NORCO/VICODIN  Take 1 tablet by mouth 2 (two) times daily as needed for pain.     IRON PO  Take 1 tablet by mouth daily.     levothyroxine 75 MCG tablet  Commonly known as:  SYNTHROID, LEVOTHROID  Take 75 mcg by mouth daily.     metoprolol tartrate 25 MG tablet  Commonly known as:  LOPRESSOR  Take 25 mg by mouth 2 (two) times daily.     multivitamin with minerals Tabs  Take 1 tablet by mouth daily. Centrum     oxybutynin 5 MG tablet  Commonly known as:  DITROPAN  Take 5 mg by mouth Daily.     Potassium Gluconate 550 MG Tabs  Take 1 tablet by mouth daily.     VITAMIN B-12 SL  Place 1 tablet under the tongue daily.     Vitamin D 2000 UNITS Caps  Take 1 capsule by mouth 2 (two) times daily.         Diagnostic Studies: Dg Chest 2 View  08/02/2012  *RADIOLOGY REPORT*  Clinical Data: Preop radiograph.  CHEST - 2 VIEW  Comparison: 10/04/2009  Findings: Heart size is normal.  No pleural effusion or edema identified.  No airspace consolidation noted.  There is spondylosis identified within the thoracic spine.  IMPRESSION:  1.  No acute cardiopulmonary abnormalities. 2.  Thoracic spondylosis.   Original Report Authenticated By: Signa Kell, M.D.    Dg Cervical Spine 2-3 Views  08/03/2012  *RADIOLOGY REPORT*  Clinical Data: C1-2 posterior fusion extension  DG C-ARM GT 120 MIN,CERVICAL SPINE - 2-3 VIEW  Technique: C-arm images AP and lateral  Comparison:  CT 07/26/2012  Findings: Limited imaging of the cervical spine especially in the lateral projection.  Fracture of the base of the dens on the prior CT.  Posterior screw and rod fusion of C3-T1 was present previously. This has been extended superiorly.  There are now screws overlying the C1 level.  These are present bilaterally.  Screw position cannot be determined accurately  without CT.  Anterior fusion with plate and screws at C4-C7.  IMPRESSION: Posterior hardware fusion has been extended superiorly with bilateral screws now present at C1.   Original Report Authenticated By: Janeece Riggers, M.D.    Ct Cervical Spine Wo Contrast  07/27/2012  **ADDENDUM** CREATED: 07/27/2012 10:14:33  Additionally, the ordering physician requests cervical spine CT images with flexion and extension positioning.  This additional imaging was performed on the afternoon of 07/26/2012 and is reported here:  Flexion:  Slightly less dorsal angulation of the type 2 odontoid fracture fragment.  Stable and normal atlantodens interval.  Stable C1 fracture configuration and stable atlanto-occipital alignment. Stable trace anterolisthesis of C3 on C4.  Extension:  Mildly increased dorsal angulation of the odontoid fragment compared to neutral positioning, a change of approximately 25  degrees compared to the flexion positioning.  Stable and normal atlantodens interval.  Stable C1 fracture configuration.  Stable atlanto-occipital alignment.  Brain windows suggest effacement of CSF at the C1-C2 thecal sac compared to the neutral and flexion positioning.  Stable trace anterolisthesis of C3 on C4.  Conclusion:  Motion of the odontoid fracture fragment with respect to the C2 body in flexion and extension positioning; angulation difference of up to 25 degrees comparing flexion to extension.  C1 fracture fragments and other upper cervical alignment appears stable.  There is evidence of CSF effacement  from the thecal sac at C1-C2 with extension positioning.  **END ADDENDUM** SIGNED BY: Harley Hallmark, M.D.   07/26/2012  *RADIOLOGY REPORT*  Clinical Data: Followup C2 fracture  CT CERVICAL SPINE WITHOUT CONTRAST  Technique:  Multidetector CT imaging of the cervical spine was performed. Multiplanar CT image reconstructions were also generated.  Comparison: 07/11/2012.  05/22/2012.  Findings: Fracture lines within the C1 vertebra are becoming less distinct indicating healing.  No change in position or alignment of the C1 fragments.  Type 2 fracture at the base of the dens continues to show loosened separation of 2 mm.  This fracture could develop chronic nonunion.  There is 1 mm of dorsal displacement of the Ganz compared to the previous exam.  No other change.  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  Anterior discectomy and fusion from C4 to C7 and posterior fusion from C3-T1 appears the same.  IMPRESSION: C1 fracture lines are becoming less distinct, indicating healing.  Continued lucency at the C2 fracture.  Nonunion could develop.  1 mm or less dorsal displacement of the dens relative to the body of C2 since the previous study.   Original Report Authenticated By: Paulina Fusi, M.D.    Dg C-arm Gt 120 Min  08/03/2012  *RADIOLOGY REPORT*  Clinical Data: C1-2 posterior fusion extension  DG C-ARM GT 120  MIN,CERVICAL SPINE - 2-3 VIEW  Technique: C-arm images AP and lateral  Comparison:  CT 07/26/2012  Findings: Limited imaging of the cervical spine especially in the lateral projection.  Fracture of the base of the dens on the prior CT.  Posterior screw and rod fusion of C3-T1 was present previously. This has been extended superiorly.  There are now screws overlying the C1 level.  These are present bilaterally.  Screw position cannot be determined accurately  without CT.  Anterior fusion with plate and screws at C4-C7.  IMPRESSION: Posterior hardware fusion has been extended superiorly with bilateral screws now present at C1.   Original Report Authenticated By: Janeece Riggers, M.D.     Disposition: 01-Home or Self Care       Future Appointments Provider Department Dept Phone   10/25/2012 11:30 AM Dava Najjar Idelle Jo [facility] MEDICAL ONCOLOGY [phone]   10/25/2012 12:00 PM Exie Parody, MD [facility] MEDICAL ONCOLOGY [phone]     POD #3 after extension of fusion to C1  - Complete PT/OT  - pain control with valium and percocet, prescriptions in the chart  - d/c home today with husband  - SCDs  Signed: Georga Bora 08/22/2012, 8:50 AM

## 2012-10-02 ENCOUNTER — Ambulatory Visit: Payer: Self-pay | Admitting: Internal Medicine

## 2012-10-25 ENCOUNTER — Other Ambulatory Visit (HOSPITAL_BASED_OUTPATIENT_CLINIC_OR_DEPARTMENT_OTHER): Payer: Medicare Other | Admitting: Lab

## 2012-10-25 ENCOUNTER — Telehealth: Payer: Self-pay | Admitting: Oncology

## 2012-10-25 ENCOUNTER — Ambulatory Visit (HOSPITAL_BASED_OUTPATIENT_CLINIC_OR_DEPARTMENT_OTHER): Payer: Medicare Other | Admitting: Oncology

## 2012-10-25 VITALS — BP 138/57 | HR 60 | Temp 97.9°F | Resp 20 | Ht 62.0 in | Wt 243.4 lb

## 2012-10-25 DIAGNOSIS — I10 Essential (primary) hypertension: Secondary | ICD-10-CM

## 2012-10-25 DIAGNOSIS — D649 Anemia, unspecified: Secondary | ICD-10-CM

## 2012-10-25 DIAGNOSIS — D631 Anemia in chronic kidney disease: Secondary | ICD-10-CM

## 2012-10-25 DIAGNOSIS — E039 Hypothyroidism, unspecified: Secondary | ICD-10-CM

## 2012-10-25 DIAGNOSIS — N189 Chronic kidney disease, unspecified: Secondary | ICD-10-CM

## 2012-10-25 LAB — CBC WITH DIFFERENTIAL/PLATELET
BASO%: 1 % (ref 0.0–2.0)
Eosinophils Absolute: 0.1 10*3/uL (ref 0.0–0.5)
MCHC: 32 g/dL (ref 31.5–36.0)
MONO#: 0.2 10*3/uL (ref 0.1–0.9)
NEUT#: 1.2 10*3/uL — ABNORMAL LOW (ref 1.5–6.5)
RBC: 4.03 10*6/uL (ref 3.70–5.45)
RDW: 17.5 % — ABNORMAL HIGH (ref 11.2–14.5)
WBC: 5.1 10*3/uL (ref 3.9–10.3)
lymph#: 3.5 10*3/uL — ABNORMAL HIGH (ref 0.9–3.3)

## 2012-10-25 LAB — COMPREHENSIVE METABOLIC PANEL (CC13)
ALT: 33 U/L (ref 0–55)
Albumin: 3.9 g/dL (ref 3.5–5.0)
CO2: 26 mEq/L (ref 22–29)
Calcium: 9.5 mg/dL (ref 8.4–10.4)
Chloride: 105 mEq/L (ref 98–107)
Glucose: 85 mg/dl (ref 70–99)
Potassium: 3.9 mEq/L (ref 3.5–5.1)
Sodium: 141 mEq/L (ref 136–145)
Total Bilirubin: 0.56 mg/dL (ref 0.20–1.20)
Total Protein: 7.1 g/dL (ref 6.4–8.3)

## 2012-10-25 NOTE — Progress Notes (Signed)
[facility]  Telephone:(336) [phone] Fax:(336) [phone]     OFFICE VISIT - HEMATOLOGY PROGRESS NOTE   DIAGNOSIS:  Anemia of chronic renal insufficiency.   TREATMENT:  Observation.   INTERVAL HISTORY:  [name] returned to clinic today with her husband. She fell and hit her head in 04/2012 and sustained cervical fractures.  She has mild neck pain and slight restriction of range of motion.  She is planning to get PT/OT soon.  She denied any visible source of bleeding.  She has mild fatigue from the fracture and treatment the last few months.  She is still independent of activities of daily living.  She has bilateral hypothenar pain due to using the canes.  Patient denies fever, anorexia, weight loss, headache, visual changes, confusion, drenching night sweats, palpable lymph node swelling, mucositis, odynophagia, dysphagia, nausea vomiting, jaundice, chest pain, palpitation, shortness of breath, dyspnea on exertion, productive cough, gum bleeding, epistaxis, hematemesis, hemoptysis, abdominal pain, abdominal swelling, early satiety, melena, hematochezia, hematuria, skin rash, spontaneous bleeding, heat or cold intolerance, bowel bladder incontinence.     Past Medical History  Diagnosis Date  . HTN (hypertension)   . Morbid obesity   . Rheumatic fever     child  . Dyslipidemia   . Swelling of joint of lower leg     recurrent lower extremity leg swelling, noted by pt  . Anemia   . Hypothyroid   . Varicose vein of leg   . MRSA (methicillin resistant staph aureus) culture positive   . TACHYCARDIA 10/17/2009    Qualifier: Diagnosis of  By: Mariah Milling MD, Tim    . Anxiety   . Cough   . Heart murmur   . Renal insufficiency 09/06/2011    DR. Daxon Kyne  [facility]   . Uterine cancer 1996    s/p hysterectomy; patient reported low stage; did not need adjuvant chemo  or radiation.   . DDD (degenerative disc disease), cervical   . DJD (degenerative joint disease)  of hip     bilateral; some replacement done  . DJD (degenerative joint disease) of knee     bilateral; some replacement done   :    Past Surgical History  Procedure Laterality Date  . Anterior cervical decompression and fusion  09/2009    involving C4-7 levels as well as a posterior cervical fusion prcedure from C3 to T  . Tonsillectomy  1966  . Gastric stapling  12/1984 and 2001  . Abdominal hysterectomy  1996  . Right hip replacement  1997  . Right ulnar nerve decompression  2005  . Right carpal tunnel release  2005  . Total left knee replacement  2006  . Halo application  05/03/2012    Procedure: HALO TRACTION APPLICATION;  Surgeon: Emilee Hero, MD;  Location: [facility] OR;  Service: Orthopedics;  Laterality: N/A;  . Joint replacement      RIGHT KNEE   . No past surgeries      RIGHT THUMB   . Ulnar nerve repair      RIGHT ARM 1.5 YRS AGO   . Tooth extraction      OSTEO NECROSIS    IN JAW    . Posterior cervical fusion/foraminotomy N/A 08/03/2012    Procedure: POSTERIOR CERVICAL FUSION/FORAMINOTOMY LEVEL 2;  Surgeon: Emilee Hero, MD;  Location: [facility] OR;  Service: Orthopedics;  Laterality: N/A;  Posterior cervical fusion, cerival 1-2, cervical 2-3 with instrumentation, iliac crest autograft  :  CURRENT MEDS: Current Outpatient Prescriptions  Medication Sig Dispense Refill  . acetaminophen-codeine (TYLENOL #3) 300-30 MG per tablet Take 1 tablet by mouth 4 (four) times daily as needed.      Marland Kitchen allopurinol (ZYLOPRIM) 300 MG tablet Take 300 mg by mouth daily.      . Calcium Carbonate-Vitamin D (CALCIUM 600+D) 600-400 MG-UNIT per tablet Take 1 tablet by mouth daily.      . Cholecalciferol (VITAMIN D) 2000 UNITS CAPS Take 1 capsule by mouth 2 (two) times daily.      . citalopram (CELEXA) 20 MG tablet Take 20 mg by mouth daily.      . Cyanocobalamin (VITAMIN B-12 SL) Place 1 tablet under the tongue daily.      . cyclobenzaprine (FLEXERIL) 5 MG tablet Take 1 tablet by  mouth 2 (two) times daily.      . fluticasone (FLONASE) 50 MCG/ACT nasal spray Place 3 sprays into the nose daily.      . IRON PO Take 1 tablet by mouth daily.      Marland Kitchen levothyroxine (SYNTHROID, LEVOTHROID) 75 MCG tablet Take 75 mcg by mouth daily.       . metoprolol tartrate (LOPRESSOR) 25 MG tablet Take 25 mg by mouth 2 (two) times daily.        . Multiple Vitamin (MULTIVITAMIN WITH MINERALS) TABS Take 1 tablet by mouth daily. Centrum      . oxybutynin (DITROPAN) 5 MG tablet Take 5 mg by mouth Daily.      . Potassium Gluconate 550 MG TABS Take 1 tablet by mouth daily.       No current facility-administered medications for this visit.      Allergies  Allergen Reactions  . Latex Itching and Rash  . Penicillins Rash  . Sulfonamide Derivatives Rash  :  Family History  Problem Relation Age of Onset  . Coronary artery disease Neg Hx   . Cancer Mother     lung cancer  . Liver disease Father   . Anemia Sister   :  History   Social History  . Marital Status: Married    Spouse Name: N/A    Number of Children: N/A  . Years of Education: N/A   Occupational History  . Not on file.   Social History Main Topics  . Smoking status: Former Smoker -- 0.25 packs/day for 10 years    Quit date: 05/25/1991  . Smokeless tobacco: Not on file     Comment: no smoking  . Alcohol Use: No  . Drug Use: No  . Sexually Active: No   Other Topics Concern  . Not on file   Social History Narrative   Lives with husband.   :  REVIEW OF SYSTEM:  The rest of the 14-point review of sytem was negative.   Exam: ECOG 1-2.  General:  Obese woman in no acute distress.  Eyes:  no scleral icterus.  ENT:  There were no oropharyngeal lesions.  Neck was without thyromegaly.  Lymphatics:  Negative cervical, supraclavicular or axillary adenopathy.  Respiratory: lungs were clear bilaterally without wheezing or crackles.  Cardiovascular:  Regular rate and rhythm, S1/S2, without rub or gallop.  There was a 3/6  systolic murmur best heard in the left upper sternal border without radiation. There was no pedal edema.  GI:  abdomen was soft, flat, nontender, nondistended, without organomegaly.  Muscoloskeletal:  no spinal tenderness of palpation of vertebral spine.  Skin exam was without echymosis, petichae.  Neuro exam  was nonfocal.  Patient was able to get on and off exam table with minor assistance.  Gait was slow.  Patient was alert and oriented.  Attention was good.   Language was appropriate.  Mood was normal without depression.  Speech was not pressured.  Thought content was not tangential.    LABS:  Lab Results  Component Value Date   WBC 5.1 10/25/2012   HGB 11.2* 10/25/2012   HCT 34.9 10/25/2012   PLT 172 10/25/2012   GLUCOSE 85 10/25/2012   ALT 33 10/25/2012   AST 40* 10/25/2012   NA 141 10/25/2012   K 3.9 10/25/2012   CL 105 10/25/2012   CREATININE 0.9 10/25/2012   BUN 19.6 10/25/2012   CO2 26 10/25/2012   INR 0.94 08/02/2012     ASSESSMENT AND PLAN:   1.  HTN:   on Metoprolol per PCP.   2.  Hypothyroidism:  On Synthroid per PCP.  Last TSH was within normal range.   3.  Gout:  on allopurinol, Tylenol #3 per PCP.   4.  Obesity:  S/p gastric bypass in the past.   5.  Renal insufficiency: slightly improved Cr.   6.  Chronic anemia: -  Work up:  Extensive testing were negative last year for myeloma, iron deficiency, Vit B12 deficiency.  She has [place] ancestry, I will send for hemoglobin electrophoresis next time to rule out thalassemia.  A bone marrow biopsy at this time has low yield.   - Treatment:  None needed as her anemia is very mild.   - Follow up:  Lab only appointment in about 4 and 8 months.  Return visit in about 1 year.  In the future, if her anemia significantly worsens or she develops pancytopenia, then we may consider further work up.

## 2012-10-25 NOTE — Patient Instructions (Addendum)
1.  Anemia:  Mild; stable.  No evidence of iron deficiency, or there issues. 2.  Follow up:  [facility]heck blood count [facility] at [facility] in about 4 and 8 months.  Return visit in about 1 year.  3.  In the future, if hemoglobin significantly worsens to about <10, we may consider further work up.

## 2012-10-25 NOTE — Telephone Encounter (Signed)
Gave pt appt for lab and Md for 1 yr then lab for 4 months and 8 months

## 2013-02-21 ENCOUNTER — Telehealth: Payer: Self-pay | Admitting: Hematology and Oncology

## 2013-02-21 NOTE — Telephone Encounter (Signed)
Returned pt's call re r/s 10/6 appt. Pt out of town due to death in family. Pt given new lb appt for 10/20 @ 8:45am. Also confirmed 07/02/13 appt and moved 6/4 f/u from CP2 to NG. Pt aware and has new d/t for lb/NG. 10/25/13 @ 2pm.

## 2013-02-26 ENCOUNTER — Other Ambulatory Visit: Payer: Medicare Other

## 2013-03-12 ENCOUNTER — Other Ambulatory Visit: Payer: Self-pay | Admitting: *Deleted

## 2013-03-12 ENCOUNTER — Telehealth: Payer: Self-pay | Admitting: Hematology and Oncology

## 2013-03-12 ENCOUNTER — Ambulatory Visit (HOSPITAL_COMMUNITY)
Admission: RE | Admit: 2013-03-12 | Discharge: 2013-03-12 | Disposition: A | Discharge status: Home / Self Care | Payer: Medicare Other | Source: Ambulatory Visit | Attending: Hematology and Oncology | Admitting: Hematology and Oncology

## 2013-03-12 ENCOUNTER — Encounter: Payer: Self-pay | Admitting: *Deleted

## 2013-03-12 ENCOUNTER — Ambulatory Visit (HOSPITAL_BASED_OUTPATIENT_CLINIC_OR_DEPARTMENT_OTHER): Payer: Medicare Other | Admitting: Hematology and Oncology

## 2013-03-12 ENCOUNTER — Encounter: Payer: Self-pay | Admitting: Hematology and Oncology

## 2013-03-12 ENCOUNTER — Other Ambulatory Visit: Payer: Self-pay | Admitting: Hematology and Oncology

## 2013-03-12 ENCOUNTER — Encounter (INDEPENDENT_AMBULATORY_CARE_PROVIDER_SITE_OTHER): Payer: Self-pay

## 2013-03-12 ENCOUNTER — Other Ambulatory Visit (HOSPITAL_COMMUNITY)
Admission: RE | Admit: 2013-03-12 | Discharge: 2013-03-12 | Disposition: A | Discharge status: Home / Self Care | Payer: Medicare Other | Source: Ambulatory Visit | Attending: Hematology and Oncology | Admitting: Hematology and Oncology

## 2013-03-12 ENCOUNTER — Other Ambulatory Visit (HOSPITAL_BASED_OUTPATIENT_CLINIC_OR_DEPARTMENT_OTHER): Payer: Medicare Other

## 2013-03-12 VITALS — BP 108/65 | HR 71 | Temp 97.8°F | Resp 20 | Ht 62.0 in | Wt 236.8 lb

## 2013-03-12 DIAGNOSIS — R05 Cough: Secondary | ICD-10-CM

## 2013-03-12 DIAGNOSIS — D61818 Other pancytopenia: Secondary | ICD-10-CM

## 2013-03-12 DIAGNOSIS — D72820 Lymphocytosis (symptomatic): Secondary | ICD-10-CM | POA: Insufficient documentation

## 2013-03-12 DIAGNOSIS — R634 Abnormal weight loss: Secondary | ICD-10-CM

## 2013-03-12 DIAGNOSIS — K8020 Calculus of gallbladder without cholecystitis without obstruction: Secondary | ICD-10-CM | POA: Insufficient documentation

## 2013-03-12 DIAGNOSIS — D649 Anemia, unspecified: Secondary | ICD-10-CM

## 2013-03-12 DIAGNOSIS — N289 Disorder of kidney and ureter, unspecified: Secondary | ICD-10-CM

## 2013-03-12 DIAGNOSIS — Z9884 Bariatric surgery status: Secondary | ICD-10-CM | POA: Insufficient documentation

## 2013-03-12 DIAGNOSIS — M954 Acquired deformity of chest and rib: Secondary | ICD-10-CM | POA: Insufficient documentation

## 2013-03-12 DIAGNOSIS — K429 Umbilical hernia without obstruction or gangrene: Secondary | ICD-10-CM | POA: Insufficient documentation

## 2013-03-12 DIAGNOSIS — N189 Chronic kidney disease, unspecified: Secondary | ICD-10-CM

## 2013-03-12 HISTORY — DX: Abnormal weight loss: R63.4

## 2013-03-12 HISTORY — DX: Lymphocytosis (symptomatic): D72.820

## 2013-03-12 HISTORY — DX: Other pancytopenia: D61.818

## 2013-03-12 LAB — CBC & DIFF AND RETIC
BASO%: 0.5 % (ref 0.0–2.0)
Basophils Absolute: 0 10*3/uL (ref 0.0–0.1)
EOS%: 3.2 % (ref 0.0–7.0)
HCT: 31.2 % — ABNORMAL LOW (ref 34.8–46.6)
HGB: 9.6 g/dL — ABNORMAL LOW (ref 11.6–15.9)
Immature Retic Fract: 5.2 % (ref 1.60–10.00)
LYMPH%: 61.1 % — ABNORMAL HIGH (ref 14.0–49.7)
MCH: 28.2 pg (ref 25.1–34.0)
MCHC: 30.8 g/dL — ABNORMAL LOW (ref 31.5–36.0)
MCV: 91.5 fL (ref 79.5–101.0)
MONO%: 3.7 % (ref 0.0–14.0)
NEUT#: 0.7 10*3/uL — ABNORMAL LOW (ref 1.5–6.5)
NEUT%: 31.5 % — ABNORMAL LOW (ref 38.4–76.8)
RBC: 3.41 10*6/uL — ABNORMAL LOW (ref 3.70–5.45)
Retic %: 1.08 % (ref 0.70–2.10)
lymph#: 1.3 10*3/uL (ref 0.9–3.3)

## 2013-03-12 LAB — COMPREHENSIVE METABOLIC PANEL (CC13)
ALT: 24 U/L (ref 0–55)
Albumin: 3.9 g/dL (ref 3.5–5.0)
Alkaline Phosphatase: 94 U/L (ref 40–150)
BUN: 29.5 mg/dL — ABNORMAL HIGH (ref 7.0–26.0)
Calcium: 9.6 mg/dL (ref 8.4–10.4)
Chloride: 111 mEq/L — ABNORMAL HIGH (ref 98–109)
Creatinine: 1.3 mg/dL — ABNORMAL HIGH (ref 0.6–1.1)
Glucose: 92 mg/dl (ref 70–140)
Potassium: 5.2 mEq/L — ABNORMAL HIGH (ref 3.5–5.1)
Total Bilirubin: 0.65 mg/dL (ref 0.20–1.20)

## 2013-03-12 LAB — FERRITIN CHCC: Ferritin: 230 ng/ml (ref 9–269)

## 2013-03-12 LAB — LACTATE DEHYDROGENASE (CC13): LDH: 178 U/L (ref 125–245)

## 2013-03-12 MED ORDER — IOHEXOL 300 MG/ML  SOLN
100.0000 mL | Freq: Once | INTRAMUSCULAR | Status: AC | PRN
  Administered 2013-03-12: 100 mL via INTRAVENOUS

## 2013-03-12 NOTE — Telephone Encounter (Signed)
gv and printed appt sched and avs for pt for NOV...gv pt barium  °

## 2013-03-12 NOTE — Telephone Encounter (Signed)
added pt on today...pt aware

## 2013-03-12 NOTE — Progress Notes (Signed)
[facility] OFFICE PROGRESS NOTE  Patient Care Team: Rafael Bihari, MD as PCP - General (Unknown Physician Specialty)  DIAGNOSIS: Anemia, lymphocytosis  SUMMARY OF ONCOLOGIC HISTORY: This is a patient it was seen here approximately 4 months ago. She was found to have anemia and was placed on observation. The cause of the anemia was thought to be related to chronic kidney disease. The patient also has history of Roux-en-Y gastric bypass and it was thought to be the contributing factor  INTERVAL HISTORY: Stacey Cooley 72 y.o. female returns for further followup. I requested the patient to be seen today when she come in for routine blood draw. Over the past 4 months, she had progressive anorexia and has lost about 40 pounds of weight. The patient that this is related to grieving process as her twin sister recently died from congestive heart failure and kidney failure. From the anemia standpoint, she denies any dizziness or lightheadedness. She does have some cough on a regular basis. It is nonproductive. The patient denies any recent bleeding such as epistaxis, hematuria, or hematochezia.She denies any recent fever, chills, night sweats   I have reviewed the past medical history, past surgical history, social history and family history with the patient and they are unchanged from previous note.  ALLERGIES:  is allergic to latex; penicillins; and sulfonamide derivatives.  MEDICATIONS:  Current Outpatient Prescriptions  Medication Sig Dispense Refill  . acetaminophen-codeine (TYLENOL #3) 300-30 MG per tablet Take 1 tablet by mouth 4 (four) times daily as needed.      Marland Kitchen allopurinol (ZYLOPRIM) 300 MG tablet Take 300 mg by mouth daily.      . benazepril (LOTENSIN) 20 MG tablet Take 20 mg by mouth daily.      . Calcium Carbonate-Vitamin D (CALCIUM 600+D) 600-400 MG-UNIT per tablet Take 1 tablet by mouth daily.      . CELEBREX 200 MG capsule       . Cholecalciferol (VITAMIN D)  2000 UNITS CAPS Take 1 capsule by mouth 2 (two) times daily.      . citalopram (CELEXA) 20 MG tablet Take 20 mg by mouth daily.      . Cyanocobalamin (VITAMIN B-12 SL) Place 1 tablet under the tongue daily.      . cyclobenzaprine (FLEXERIL) 5 MG tablet Take 1 tablet by mouth 2 (two) times daily.      . fluticasone (FLONASE) 50 MCG/ACT nasal spray Place 3 sprays into the nose daily.      . furosemide (LASIX) 40 MG tablet       . IRON PO Take 1 tablet by mouth daily.      Marland Kitchen levothyroxine (SYNTHROID, LEVOTHROID) 75 MCG tablet Take 75 mcg by mouth daily.       . metoprolol tartrate (LOPRESSOR) 25 MG tablet Take 25 mg by mouth 2 (two) times daily.        . Multiple Vitamin (MULTIVITAMIN WITH MINERALS) TABS Take 1 tablet by mouth daily. Centrum      . oxybutynin (DITROPAN) 5 MG tablet Take 5 mg by mouth Daily.      . Potassium Gluconate 550 MG TABS Take 1 tablet by mouth daily.      Marland Kitchen ZOSTAVAX 09811 UNT/0.65ML injection        No current facility-administered medications for this visit.    REVIEW OF SYSTEMS:   Eyes: Denies blurriness of vision Ears, nose, mouth, throat, and face: Denies mucositis or sore throat Cardiovascular: Denies palpitation, chest discomfort or  lower extremity swelling Gastrointestinal:  Denies nausea, heartburn or change in bowel habits Skin: Denies abnormal skin rashes Lymphatics: Denies new lymphadenopathy or easy bruising Neurological:Denies numbness, tingling or new weaknesses Behavioral/Psych: Mood is stable, no new changes  All other systems were reviewed with the patient and are negative.  PHYSICAL EXAMINATION: ECOG PERFORMANCE STATUS: 1 - Symptomatic but completely ambulatory  Filed Vitals:   03/12/13 0934  BP: 108/65  Pulse: 71  Temp: 97.8 F (36.6 C)  Resp: 20   Filed Weights   03/12/13 0934  Weight: 236 lb 12.8 oz (107.412 kg)    GENERAL:alert, no distress and comfortable. The patient is morbidly obese SKIN: skin color, texture, turgor are  normal, no rashes or significant lesions EYES: normal, Conjunctiva are pink and non-injected, sclera clear OROPHARYNX:no exudate, no erythema and lips, buccal mucosa, and tongue normal  NECK: supple, thyroid normal size, non-tender, without nodularity LYMPH:  no palpable lymphadenopathy in the cervical, axillary or inguinal LUNGS: clear to auscultation and percussion with normal breathing effort HEART: regular rate & rhythm and no murmurs and no lower extremity edema ABDOMEN:abdomen soft, non-tender and normal bowel sounds. Unable to appreciate splenomegaly Musculoskeletal:no cyanosis of digits and no clubbing  NEURO: alert & oriented x 3 with fluent speech, no focal motor/sensory deficits  LABORATORY DATA:  I have reviewed the data as listed    Component Value Date/Time   NA 141 10/25/2012 1135   NA 144 08/02/2012 1500   K 3.9 10/25/2012 1135   K 4.0 08/02/2012 1500   CL 105 10/25/2012 1135   CL 103 08/02/2012 1500   CO2 26 10/25/2012 1135   CO2 28 08/02/2012 1500   GLUCOSE 85 10/25/2012 1135   GLUCOSE 76 08/02/2012 1500   BUN 19.6 10/25/2012 1135   BUN 15 08/02/2012 1500   CREATININE 0.9 10/25/2012 1135   CREATININE 0.94 08/02/2012 1500   CREATININE 1.22* 09/08/2011 1120   CALCIUM 9.5 10/25/2012 1135   CALCIUM 10.3 08/02/2012 1500   PROT 7.1 10/25/2012 1135   PROT 7.9 08/02/2012 1500   ALBUMIN 3.9 10/25/2012 1135   ALBUMIN 4.1 08/02/2012 1500   AST 40* 10/25/2012 1135   AST 29 08/02/2012 1500   ALT 33 10/25/2012 1135   ALT 38* 08/02/2012 1500   ALKPHOS 104 10/25/2012 1135   ALKPHOS 151* 08/02/2012 1500   BILITOT 0.56 10/25/2012 1135   BILITOT 0.3 08/02/2012 1500   GFRNONAA 61* 08/02/2012 1500   GFRAA 70* 08/02/2012 1500    No results found for this basename: SPEP, UPEP,  kappa and lambda light chains    Lab Results  Component Value Date   WBC 2.2* 03/12/2013   NEUTROABS 0.7* 03/12/2013   HGB 9.6* 03/12/2013   HCT 31.2* 03/12/2013   MCV 91.5 03/12/2013   PLT 97* 03/12/2013      Chemistry       Component Value Date/Time   NA 141 10/25/2012 1135   NA 144 08/02/2012 1500   K 3.9 10/25/2012 1135   K 4.0 08/02/2012 1500   CL 105 10/25/2012 1135   CL 103 08/02/2012 1500   CO2 26 10/25/2012 1135   CO2 28 08/02/2012 1500   BUN 19.6 10/25/2012 1135   BUN 15 08/02/2012 1500   CREATININE 0.9 10/25/2012 1135   CREATININE 0.94 08/02/2012 1500   CREATININE 1.22* 09/08/2011 1120      Component Value Date/Time   CALCIUM 9.5 10/25/2012 1135   CALCIUM 10.3 08/02/2012 1500   ALKPHOS 104 10/25/2012 1135  ALKPHOS 151* 08/02/2012 1500   AST 40* 10/25/2012 1135   AST 29 08/02/2012 1500   ALT 33 10/25/2012 1135   ALT 38* 08/02/2012 1500   BILITOT 0.56 10/25/2012 1135   BILITOT 0.3 08/02/2012 1500      ASSESSMENT:  #1 pancytopenia #2 significant weight loss  #3 recent lymphocytosis  PLAN:  #1 pancytopenia I am concerned about the cause of this. She had significant lymphocytosis for years and now she may have an undiagnosed chronic lymphocytic leukemia. With her recent weight loss and now significant pancytopenia, am concerned about possible transformation into aggressive lymphoma. I will go ahead and order a CT scan of the chest, abdomen and pelvis to stage her disease. I have ordered additional workup 4 pancytopenia. At present time she does not required blood or platelet transfusions. I will see her back in 2 weeks for further assessment. At present time, she is not in any danger of getting severe, life-threatening infection and I do not think she needs an antibiotic for her leukopenia either.  Orders Placed This Encounter  Procedures  . CT Chest W Contrast    Standing Status: Future     Number of Occurrences:      Standing Expiration Date: 03/12/2014    Order Specific Question:  Reason for exam:    Answer:  panyctopenia, lymphocytosis, possible lymphoma, cough    Order Specific Question:  Preferred imaging location?    Answer:  [facility]  . CT Abdomen Pelvis W Contrast    Standing Status: Future      Number of Occurrences:      Standing Expiration Date: 03/12/2014    Order Specific Question:  Reason for exam:    Answer:  weight loss, pancytopenia, possible lymphoma    Order Specific Question:  Preferred imaging location?    Answer:  [facility]   All questions were answered. The patient knows to call the clinic with any problems, questions or concerns. No barriers to learning was detected.    Garreth Burnsworth, MD 03/12/2013 10:31 AM

## 2013-03-13 ENCOUNTER — Telehealth: Payer: Self-pay | Admitting: *Deleted

## 2013-03-13 LAB — IGG, IGA, IGM
IgA: 260 mg/dL (ref 69–380)
IgG (Immunoglobin G), Serum: 723 mg/dL (ref 690–1700)
IgM, Serum: 47 mg/dL — ABNORMAL LOW (ref 52–322)

## 2013-03-13 LAB — HAPTOGLOBIN: Haptoglobin: 28 mg/dL — ABNORMAL LOW (ref 45–215)

## 2013-03-13 NOTE — Telephone Encounter (Signed)
Informed husband that Dr. Bertis Ruddy can see pt tomorrow at 1 pm.  Husband agreed.

## 2013-03-13 NOTE — Telephone Encounter (Signed)
I do not give out results over the phone I have an opening in the afternoon, please see if they want to come in to discuss

## 2013-03-13 NOTE — Telephone Encounter (Signed)
Husband requesting results of pt's CT scan and labwork from yesterday.  He or pt can be reached at home [phone] or cell# [phone].

## 2013-03-14 ENCOUNTER — Ambulatory Visit (HOSPITAL_BASED_OUTPATIENT_CLINIC_OR_DEPARTMENT_OTHER): Payer: Medicare Other | Admitting: Hematology and Oncology

## 2013-03-14 ENCOUNTER — Telehealth: Payer: Self-pay | Admitting: Hematology and Oncology

## 2013-03-14 ENCOUNTER — Other Ambulatory Visit: Payer: Self-pay | Admitting: Hematology and Oncology

## 2013-03-14 ENCOUNTER — Encounter: Payer: Self-pay | Admitting: Hematology and Oncology

## 2013-03-14 VITALS — BP 92/50 | HR 59 | Temp 96.9°F | Resp 19 | Ht 62.0 in | Wt 240.5 lb

## 2013-03-14 DIAGNOSIS — D61818 Other pancytopenia: Secondary | ICD-10-CM

## 2013-03-14 NOTE — Telephone Encounter (Signed)
gv and printed appt sched and avs for pt for NOV °

## 2013-03-14 NOTE — Progress Notes (Signed)
[facility] OFFICE PROGRESS NOTE  Patient Care Team: Rafael Bihari, MD as PCP - General (Unknown Physician Specialty)  DIAGNOSIS: Possible diagnosis of CLL  SUMMARY OF ONCOLOGIC HISTORY: Please see my dictated dictation from 03/12/2013 for further details. The patient returned today to followup on test results.  INTERVAL HISTORY: Stacey Cooley 72 y.o. female returns for review of test results. She complained of mild fatigue. She gained a little bit of weight since the last time she was seen him. She denies any recent fever, chills, night sweats or abnormal weight loss The patient denies any recent signs or symptoms of bleeding such as spontaneous epistaxis, hematuria or hematochezia.  I have reviewed the past medical history, past surgical history, social history and family history with the patient and they are unchanged from previous note.  ALLERGIES:  is allergic to latex; penicillins; and sulfonamide derivatives.  MEDICATIONS:  Current Outpatient Prescriptions  Medication Sig Dispense Refill  . acetaminophen-codeine (TYLENOL #3) 300-30 MG per tablet Take 1 tablet by mouth 4 (four) times daily as needed.      Marland Kitchen allopurinol (ZYLOPRIM) 300 MG tablet Take 300 mg by mouth daily.      . benazepril (LOTENSIN) 20 MG tablet Take 20 mg by mouth daily.      . Calcium Carbonate-Vitamin D (CALCIUM 600+D) 600-400 MG-UNIT per tablet Take 1 tablet by mouth daily.      . CELEBREX 200 MG capsule       . Cholecalciferol (VITAMIN D) 2000 UNITS CAPS Take 1 capsule by mouth 2 (two) times daily.      . citalopram (CELEXA) 20 MG tablet Take 20 mg by mouth daily.      . Cyanocobalamin (VITAMIN B-12 SL) Place 1 tablet under the tongue daily.      . cyclobenzaprine (FLEXERIL) 5 MG tablet Take 1 tablet by mouth 2 (two) times daily.      . fluticasone (FLONASE) 50 MCG/ACT nasal spray Place 3 sprays into the nose daily.      . furosemide (LASIX) 40 MG tablet       . levothyroxine  (SYNTHROID, LEVOTHROID) 75 MCG tablet Take 75 mcg by mouth daily.       . metoprolol tartrate (LOPRESSOR) 25 MG tablet Take 25 mg by mouth 2 (two) times daily.        . Multiple Vitamin (MULTIVITAMIN WITH MINERALS) TABS Take 1 tablet by mouth daily. Centrum      . oxybutynin (DITROPAN) 5 MG tablet Take 5 mg by mouth Daily.      . Potassium Gluconate 550 MG TABS Take 1 tablet by mouth daily.      Marland Kitchen ZOSTAVAX 16109 UNT/0.65ML injection       . IRON PO Take 1 tablet by mouth daily.       No current facility-administered medications for this visit.    REVIEW OF SYSTEMS:   Constitutional: Denies fevers, chills or abnormal weight loss All other systems were reviewed with the patient and are negative.  PHYSICAL EXAMINATION: ECOG PERFORMANCE STATUS: 1 - Symptomatic but completely ambulatory  Filed Vitals:   03/14/13 1259  BP: 92/50  Pulse: 59  Temp: 96.9 F (36.1 C)  Resp: 19   Filed Weights   03/14/13 1259  Weight: 240 lb 8 oz (109.09 kg)   GENERAL:alert, no distress and comfortable NEURO: alert & oriented x 3 with fluent speech, no focal motor/sensory deficits  LABORATORY DATA:  I have reviewed the data as listed  Component Value Date/Time   NA 143 03/12/2013 1507   NA 144 08/02/2012 1500   K 5.2* 03/12/2013 1507   K 4.0 08/02/2012 1500   CL 105 10/25/2012 1135   CL 103 08/02/2012 1500   CO2 21* 03/12/2013 1507   CO2 28 08/02/2012 1500   GLUCOSE 92 03/12/2013 1507   GLUCOSE 85 10/25/2012 1135   GLUCOSE 76 08/02/2012 1500   BUN 29.5* 03/12/2013 1507   BUN 15 08/02/2012 1500   CREATININE 1.3* 03/12/2013 1507   CREATININE 0.94 08/02/2012 1500   CREATININE 1.22* 09/08/2011 1120   CALCIUM 9.6 03/12/2013 1507   CALCIUM 10.3 08/02/2012 1500   PROT 6.7 03/12/2013 1507   PROT 7.9 08/02/2012 1500   ALBUMIN 3.9 03/12/2013 1507   ALBUMIN 4.1 08/02/2012 1500   AST 32 03/12/2013 1507   AST 29 08/02/2012 1500   ALT 24 03/12/2013 1507   ALT 38* 08/02/2012 1500   ALKPHOS 94 03/12/2013 1507    ALKPHOS 151* 08/02/2012 1500   BILITOT 0.65 03/12/2013 1507   BILITOT 0.3 08/02/2012 1500   GFRNONAA 61* 08/02/2012 1500   GFRAA 70* 08/02/2012 1500    No results found for this basename: SPEP, UPEP,  kappa and lambda light chains    Lab Results  Component Value Date   WBC 2.2* 03/12/2013   NEUTROABS 0.7* 03/12/2013   HGB 9.6* 03/12/2013   HCT 31.2* 03/12/2013   MCV 91.5 03/12/2013   PLT 97* 03/12/2013      Chemistry      Component Value Date/Time   NA 143 03/12/2013 1507   NA 144 08/02/2012 1500   K 5.2* 03/12/2013 1507   K 4.0 08/02/2012 1500   CL 105 10/25/2012 1135   CL 103 08/02/2012 1500   CO2 21* 03/12/2013 1507   CO2 28 08/02/2012 1500   BUN 29.5* 03/12/2013 1507   BUN 15 08/02/2012 1500   CREATININE 1.3* 03/12/2013 1507   CREATININE 0.94 08/02/2012 1500   CREATININE 1.22* 09/08/2011 1120      Component Value Date/Time   CALCIUM 9.6 03/12/2013 1507   CALCIUM 10.3 08/02/2012 1500   ALKPHOS 94 03/12/2013 1507   ALKPHOS 151* 08/02/2012 1500   AST 32 03/12/2013 1507   AST 29 08/02/2012 1500   ALT 24 03/12/2013 1507   ALT 38* 08/02/2012 1500   BILITOT 0.65 03/12/2013 1507   BILITOT 0.3 08/02/2012 1500       RADIOGRAPHIC STUDIES: I have personally reviewed the radiological images as listed and agreed with the findings in the report. Ct Chest W Contrast  03/12/2013   CLINICAL DATA:  Lymphocytosis. Possible lymphoma. Pancytopenia. Weight loss.  EXAM: CT CHEST, ABDOMEN, AND PELVIS WITH CONTRAST  TECHNIQUE: Multidetector CT imaging of the chest, abdomen and pelvis was performed following the standard protocol during bolus administration of intravenous contrast.  CONTRAST:  OMNIPAQUE IOHEXOL 300 MG/ML  SOLN  COMPARISON:  None.  FINDINGS: CT CHEST FINDINGS  No evidence of mediastinal or hilar masses. No adenopathy seen within the thorax. No evidence of pleural or pericardial effusion.  No evidence of pulmonary infiltrate or central endobronchial lesion. No suspicious pulmonary  nodules or masses are identified. No evidence of chest wall mass or suspicious bone lesions. Several old left rib fracture deformities incidentally noted.  CT ABDOMEN AND PELVIS FINDINGS  Postop changes seen from previous gastric bypass surgery. The liver, spleen, pancreas, adrenal glands, and kidneys are normal in appearance. No evidence of hydronephrosis. Several small calcified gallstones are noted,  however there is no evidence of cholecystitis or biliary dilatation.  No other soft tissue masses or lymphadenopathy identified within the abdomen or pelvis. Prior hysterectomy noted. Adnexal regions are unremarkable. No evidence of inflammatory process or abnormal fluid collections. A small paraumbilical hernia is seen containing only omental fat. No evidence of herniated bowel. Right hip prosthesis noted, but no suspicious bone lesions identified.  IMPRESSION: No evidence of lymphadenopathy, mass, or other acute findings.  Cholelithiasis. No radiographic evidence of cholecystitis.  Small periumbilical hernia containing only omental fat.   Electronically Signed   By: Myles Rosenthal M.D.   On: 03/12/2013 16:37   Ct Abdomen Pelvis W Contrast  03/12/2013   CLINICAL DATA:  Lymphocytosis. Possible lymphoma. Pancytopenia. Weight loss.  EXAM: CT CHEST, ABDOMEN, AND PELVIS WITH CONTRAST  TECHNIQUE: Multidetector CT imaging of the chest, abdomen and pelvis was performed following the standard protocol during bolus administration of intravenous contrast.  CONTRAST:  OMNIPAQUE IOHEXOL 300 MG/ML  SOLN  COMPARISON:  None.  FINDINGS: CT CHEST FINDINGS  No evidence of mediastinal or hilar masses. No adenopathy seen within the thorax. No evidence of pleural or pericardial effusion.  No evidence of pulmonary infiltrate or central endobronchial lesion. No suspicious pulmonary nodules or masses are identified. No evidence of chest wall mass or suspicious bone lesions. Several old left rib fracture deformities incidentally noted.   CT ABDOMEN AND PELVIS FINDINGS  Postop changes seen from previous gastric bypass surgery. The liver, spleen, pancreas, adrenal glands, and kidneys are normal in appearance. No evidence of hydronephrosis. Several small calcified gallstones are noted, however there is no evidence of cholecystitis or biliary dilatation.  No other soft tissue masses or lymphadenopathy identified within the abdomen or pelvis. Prior hysterectomy noted. Adnexal regions are unremarkable. No evidence of inflammatory process or abnormal fluid collections. A small paraumbilical hernia is seen containing only omental fat. No evidence of herniated bowel. Right hip prosthesis noted, but no suspicious bone lesions identified.  IMPRESSION: No evidence of lymphadenopathy, mass, or other acute findings.  Cholelithiasis. No radiographic evidence of cholecystitis.  Small periumbilical hernia containing only omental fat.   Electronically Signed   By: Myles Rosenthal M.D.   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 Am concerned about the pancytopenia. She also had recent weight loss. Flow cytometry detected some mild abnormalities but it is non-diagnostic and inconclusive. I recommend bone marrow aspirate and biopsy. In the meantime the patient is instructed to come in on a weekly basis for blood work monitoring just in case she needs blood transfusion We discussed about the risk, benefits, and side effects of bone marrow aspirate and biopsy and she is in agreement to proceed. The patient is morbidly obese. She has a prior history of surgery to hip. I recommend a CT-guided bone marrow aspirate and biopsy under sedation. I will order her test to exclude possible diagnosis of CLL or AML. In the meantime educated the patient about watching for signs of bleeding. In the meantime, she is not symptomatic. She does  not require blood transfusion.  Orders Placed This Encounter  Procedures  . CT Biopsy    Need test to exclude MDS/AML: FISH for AML and CLL    Standing Status: Future     Number of Occurrences:      Standing Expiration Date: 03/14/2014    Order  Specific Question:  Reason for Exam (SYMPTOM  OR DIAGNOSIS REQUIRED)    Answer:  need bone marrow aspirate and biopsy    Order Specific Question:  Preferred imaging location?    Answer:  [facility]  . CBC with Differential    Standing Status: Standing     Number of Occurrences: 3     Standing Expiration Date: 03/14/2014  . Hold Tube, Blood Bank    Only transfuse blood if hemoglobin <8 g or platelet <10,000    Standing Status: Standing     Number of Occurrences: 3     Standing Expiration Date: 03/14/2014   All questions were answered. The patient knows to call the clinic with any problems, questions or concerns. No barriers to learning was detected.    [facility], Burnette Valenti, MD 03/14/2013 1:54 PM

## 2013-03-14 NOTE — Patient Instructions (Signed)
Chronic Lymphocytic Leukemia Chronic lymphocytic leukemia (CLL) is a type of cancer of the bone marrow and blood cells. Bone marrow is the soft, spongy tissue inside your bone. In CLL, the bone marrow makes too many white blood cells that usually fight infection in the body (lymphocytes). CLL is the most common type of adult leukemia.  CAUSES  No one knows the exact cause of CLL. There is a higher risk of CLL in people who:   Are over 70 years of age.  Are white.  Are female.  Have a family history of CLL or other cancers of the lymph system.  Are of [place] or [place] European Jewish descent.  Have been exposed to certain chemicals, such as Agent Orange (used in the [place] War) or other herbicides or insecticides. SYMPTOMS  At first, some people do not have symptoms of chronic lymphocytic leukemia. After a while, people may notice some symptoms, such as:   Feeling more tired than usual, even after rest.  Unplanned weight loss.  Heavy sweating at night.  Fevers.  Shortness of breath.  Decreased energy.  Paleness.  Painless, swollen lymph nodes.  A feeling of fullness in the upper left part of the abdomen.  Easy bruising and/or bleeding.  More frequent infections. DIAGNOSIS   During a physical exam, your caregiver may notice an enlarged spleen, liver and/or lymph nodes.  Blood and bone marrow tests are performed to identify the presence of cancer cells.  A CT scan may be done to look for swelling or abnormalities in your spleen, liver, and lymph nodes. TREATMENT  Treatment options for CLL depend on the stage and the presence of symptoms. There are a number of types of treatment used for this condition, including:  Targeted drugs. These are drugs that interfere with chemicals that leukemia cells need in order to grow and multiply.  Chemotherapy drugs. These medications kill cells that are multiplying quickly, such as leukemia cells.  Biological therapy. This  treatment boosts the ability of the patient's own immune system to fight the leukemia cells.  Bone marrow or peripheral blood stem cell transplant. This treatment allows the patient to receive very high doses of chemotherapy and/or radiation. These high doses kill the cancer cells, but also destroy the bone marrow. After treatment is complete, the patient is given donor bone marrow or stem cells, which will replace the bone marrow. HOME CARE INSTRUCTIONS   Because you have an increased risk of infection, practice good hand washing and avoid being around people who are ill or crowded places.  Because you have an increased risk of bleeding and bruising, avoid contact sports or other rough activities.  Only take over-the-counter or prescription medicines for pain, discomfort or fever as directed by your caregiver.  Although some of your treatments might affect your appetite, try to eat regular, healthy meals.  If you develop any side effects, such as nausea, diarrhea, rash, white patches in your mouth, a sore throat, difficulty swallowing, or severe fatigue, tell your caregiver. He or she may have recommendations of things you can do to improve symptoms.  Consider learning some ways to cope with the stress of having a chronic illness, such as yoga, meditation, or participating in a support group. SEEK IMMEDIATE MEDICAL CARE IF:  You develop an unexplained oral temperature of 102 F (38.9 C) or more.  You develop chest pains.  You develop a severe stiff neck or headache.  You have trouble breathing or feel short of breath.  You feel very lightheaded or pass out.  You notice pain, swelling or redness anywhere in your legs.  You have pain in your belly (abdomen).  You develop new bruises that are getting bigger.  You have painful or more swollen lymph nodes.  You develop bleeding from your gums, nose, or in your urine or stools.  You are unable to stop throwing up (vomiting).  You  cannot keep liquids down.  You feel depressed. Document Released: 09/26/2008 Document Revised: 08/02/2011 Document Reviewed: 09/26/2008 [facility] Patient Information 2014 [place], [place].

## 2013-03-19 ENCOUNTER — Telehealth: Payer: Self-pay | Admitting: Hematology and Oncology

## 2013-03-19 NOTE — Telephone Encounter (Signed)
pt called to r/s lab to later time...done

## 2013-03-20 ENCOUNTER — Other Ambulatory Visit: Payer: Self-pay | Admitting: Radiology

## 2013-03-20 ENCOUNTER — Encounter (HOSPITAL_COMMUNITY): Payer: Self-pay | Admitting: Pharmacy Technician

## 2013-03-21 ENCOUNTER — Telehealth: Payer: Self-pay | Admitting: *Deleted

## 2013-03-21 ENCOUNTER — Other Ambulatory Visit (HOSPITAL_BASED_OUTPATIENT_CLINIC_OR_DEPARTMENT_OTHER): Payer: Medicare Other

## 2013-03-21 ENCOUNTER — Other Ambulatory Visit: Payer: Medicare Other

## 2013-03-21 DIAGNOSIS — D61818 Other pancytopenia: Secondary | ICD-10-CM

## 2013-03-21 LAB — CBC WITH DIFFERENTIAL/PLATELET
Basophils Absolute: 0 10*3/uL (ref 0.0–0.1)
Eosinophils Absolute: 0.1 10*3/uL (ref 0.0–0.5)
MCV: 90.3 fL (ref 79.5–101.0)
MONO%: 5.2 % (ref 0.0–14.0)
NEUT#: 0.7 10*3/uL — ABNORMAL LOW (ref 1.5–6.5)
Platelets: 97 10*3/uL — ABNORMAL LOW (ref 145–400)
RBC: 3.08 10*6/uL — ABNORMAL LOW (ref 3.70–5.45)
RDW: 17.2 % — ABNORMAL HIGH (ref 11.2–14.5)
WBC: 2.9 10*3/uL — ABNORMAL LOW (ref 3.9–10.3)

## 2013-03-21 LAB — HOLD TUBE, BLOOD BANK

## 2013-03-21 NOTE — Telephone Encounter (Signed)
Pt requests to change her lab appts on Wed 11/5 to Tues 11/04 and on Wed 11/12 to Tues 11/11.   Sent POF to scheduling.

## 2013-03-22 ENCOUNTER — Ambulatory Visit: Payer: Medicare Other | Admitting: Hematology and Oncology

## 2013-03-22 ENCOUNTER — Telehealth: Payer: Self-pay | Admitting: Hematology and Oncology

## 2013-03-22 NOTE — Telephone Encounter (Signed)
s.w. pt and confirmed appt d/t for all NOV appts....pt ok and aware

## 2013-03-23 ENCOUNTER — Encounter (HOSPITAL_COMMUNITY): Payer: Self-pay

## 2013-03-23 ENCOUNTER — Ambulatory Visit (HOSPITAL_COMMUNITY)
Admission: RE | Admit: 2013-03-23 | Discharge: 2013-03-23 | Disposition: A | Discharge status: Home / Self Care | Payer: Medicare Other | Source: Ambulatory Visit | Attending: Hematology and Oncology | Admitting: Hematology and Oncology

## 2013-03-23 DIAGNOSIS — D61818 Other pancytopenia: Secondary | ICD-10-CM | POA: Insufficient documentation

## 2013-03-23 DIAGNOSIS — Z96659 Presence of unspecified artificial knee joint: Secondary | ICD-10-CM | POA: Insufficient documentation

## 2013-03-23 DIAGNOSIS — D72820 Lymphocytosis (symptomatic): Secondary | ICD-10-CM | POA: Insufficient documentation

## 2013-03-23 DIAGNOSIS — E039 Hypothyroidism, unspecified: Secondary | ICD-10-CM | POA: Insufficient documentation

## 2013-03-23 DIAGNOSIS — I10 Essential (primary) hypertension: Secondary | ICD-10-CM | POA: Insufficient documentation

## 2013-03-23 DIAGNOSIS — Z79899 Other long term (current) drug therapy: Secondary | ICD-10-CM | POA: Insufficient documentation

## 2013-03-23 DIAGNOSIS — Z87891 Personal history of nicotine dependence: Secondary | ICD-10-CM | POA: Insufficient documentation

## 2013-03-23 DIAGNOSIS — E785 Hyperlipidemia, unspecified: Secondary | ICD-10-CM | POA: Insufficient documentation

## 2013-03-23 DIAGNOSIS — Z8542 Personal history of malignant neoplasm of other parts of uterus: Secondary | ICD-10-CM | POA: Insufficient documentation

## 2013-03-23 LAB — CBC
Hemoglobin: 9.3 g/dL — ABNORMAL LOW (ref 12.0–15.0)
MCHC: 32.2 g/dL (ref 30.0–36.0)
MCV: 90 fL (ref 78.0–100.0)
Platelets: 92 10*3/uL — ABNORMAL LOW (ref 150–400)
RDW: 16.2 % — ABNORMAL HIGH (ref 11.5–15.5)

## 2013-03-23 LAB — PROTIME-INR
INR: 1.07 (ref 0.00–1.49)
Prothrombin Time: 13.7 seconds (ref 11.6–15.2)

## 2013-03-23 LAB — APTT: aPTT: 27 seconds (ref 24–37)

## 2013-03-23 MED ORDER — FENTANYL CITRATE 0.05 MG/ML IJ SOLN
INTRAMUSCULAR | Status: AC | PRN
  Administered 2013-03-23: 100 ug via INTRAVENOUS

## 2013-03-23 MED ORDER — MIDAZOLAM HCL 2 MG/2ML IJ SOLN
INTRAMUSCULAR | Status: AC | PRN
  Administered 2013-03-23 (×2): 1 mg via INTRAVENOUS

## 2013-03-23 MED ORDER — FENTANYL CITRATE 0.05 MG/ML IJ SOLN
INTRAMUSCULAR | Status: AC
  Filled 2013-03-23: qty 4

## 2013-03-23 MED ORDER — HYDROCODONE-ACETAMINOPHEN 5-325 MG PO TABS
1.0000 | ORAL_TABLET | ORAL | Status: DC | PRN
  Filled 2013-03-23: qty 2

## 2013-03-23 MED ORDER — MIDAZOLAM HCL 2 MG/2ML IJ SOLN
INTRAMUSCULAR | Status: AC
  Filled 2013-03-23: qty 4

## 2013-03-23 MED ORDER — SODIUM CHLORIDE 0.9 % IV SOLN
INTRAVENOUS | Status: DC
  Administered 2013-03-23: 09:00:00 via INTRAVENOUS

## 2013-03-23 NOTE — Procedures (Signed)
Successful RT ILIAC BM ASP AND CORE BX NO COMP STABLE FULL REPORT IN PACS PATH PENDING  

## 2013-03-23 NOTE — H&P (Signed)
Stacey Cooley is an 72 y.o. female.   Chief Complaint: "I'm having a bone marrow biopsy" HPI: Patient with history of pancytopenia presents today for CT guided bone marrow biopsy to r/o possible CLL/AML.  Past Medical History  Diagnosis Date  . HTN (hypertension)   . Morbid obesity   . Rheumatic fever     child  . Dyslipidemia   . Swelling of joint of lower leg     recurrent lower extremity leg swelling, noted by pt  . Anemia   . Hypothyroid   . Varicose vein of leg   . MRSA (methicillin resistant staph aureus) culture positive   . TACHYCARDIA 10/17/2009    Qualifier: Diagnosis of  By: Mariah Milling MD, Tim    . Anxiety   . Cough   . Heart murmur   . Renal insufficiency 09/06/2011    DR. HA  [facility]   . Uterine cancer 1996    s/p hysterectomy; patient reported low stage; did not need adjuvant chemo  or radiation.   . DDD (degenerative disc disease), cervical   . 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 Lymphocytosis 03/12/2013  . Other pancytopenia 03/12/2013  . Weight loss 03/12/2013    Past Surgical History  Procedure Laterality Date  . Anterior cervical decompression and fusion  09/2009    involving C4-7 levels as well as a posterior cervical fusion prcedure from C3 to T  . Tonsillectomy  1966  . Gastric stapling  12/1984 and 2001  . Abdominal hysterectomy  1996  . Right hip replacement  1997  . Right ulnar nerve decompression  2005  . Right carpal tunnel release  2005  . Total left knee replacement  2006  . Halo application  05/03/2012    Procedure: HALO TRACTION APPLICATION;  Surgeon: Emilee Hero, MD;  Location: [facility] OR;  Service: Orthopedics;  Laterality: N/A;  . Joint replacement      RIGHT KNEE   . No past surgeries      RIGHT THUMB   . Ulnar nerve repair      RIGHT ARM 1.5 YRS AGO   . 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 Posterior cervical fusion/foraminotomy N/A 08/03/2012    Procedure: POSTERIOR CERVICAL FUSION/FORAMINOTOMY LEVEL 2;  Surgeon: Emilee Hero, MD;  Location: [facility] OR;  Service: Orthopedics;  Laterality: N/A;  Posterior cervical fusion, cerival 1-2, cervical 2-3 with instrumentation, iliac crest autograft  . Right hip replacement      Family History  Problem Relation Age of Onset  . Coronary artery disease Neg Hx   . Cancer Mother     lung cancer  . Liver disease Father   . Anemia Sister    Social History:  reports that she quit smoking about 21 years ago. She does not have any smokeless tobacco history on file. She reports that she does not drink alcohol or use illicit drugs.  Allergies:  Allergies  Allergen Reactions  . Latex Itching and Rash  . Penicillins Rash  . Sulfonamide Derivatives Rash    Current outpatient prescriptions:acetaminophen-codeine (TYLENOL #3) 300-30 MG per tablet, Take 1 tablet by mouth 4 (four) times daily as needed for pain. , Disp: , Rfl: ;  allopurinol (ZYLOPRIM) 300 MG tablet, Take 300 mg by  mouth every morning. , Disp: , Rfl: ;  aspirin 81 MG tablet, Take 81 mg by mouth daily., Disp: , Rfl: ;  benazepril (LOTENSIN) 20 MG tablet, Take 20 mg by mouth every evening. , Disp: , Rfl:  Calcium Carbonate-Vitamin D (CALCIUM 600+D) 600-400 MG-UNIT per tablet, Take 1 tablet by mouth daily., Disp: , Rfl: ;  cholecalciferol (VITAMIN D) 1000 UNITS tablet, Take 1,000 Units by mouth 2 (two) times daily., Disp: , Rfl: ;  citalopram (CELEXA) 20 MG tablet, Take 20 mg by mouth every morning. , Disp: , Rfl: ;  Cyanocobalamin (VITAMIN B-12 SL), Place 1 tablet under the tongue daily., Disp: , Rfl:  cyclobenzaprine (FLEXERIL) 5 MG tablet, Take 1 tablet by mouth 2 (two) times daily., Disp: , Rfl: ;  fluticasone (FLONASE) 50 MCG/ACT nasal spray, Place 3 sprays into the nose daily., Disp: , Rfl: ;  levothyroxine (SYNTHROID, LEVOTHROID) 75 MCG tablet, Take 75 mcg by mouth  daily before breakfast. , Disp: , Rfl: ;  metoprolol tartrate (LOPRESSOR) 25 MG tablet, Take 25 mg by mouth 2 (two) times daily.  , Disp: , Rfl:  Multiple Vitamin (MULTIVITAMIN WITH MINERALS) TABS, Take 1 tablet by mouth daily. Centrum, Disp: , Rfl: ;  oxybutynin (DITROPAN) 5 MG tablet, Take 5 mg by mouth Daily., Disp: , Rfl: ;  Potassium Gluconate 550 MG TABS, Take 1 tablet by mouth daily., Disp: , Rfl: ;  furosemide (LASIX) 40 MG tablet, Take 40 mg by mouth daily as needed for fluid. , Disp: , Rfl:  Current facility-administered medications:0.9 %  sodium chloride infusion, , Intravenous, Continuous, D Jeananne Rama, PA-C, Last Rate: 20 mL/hr at 03/23/13 0913   Results for orders placed during the hospital encounter of 03/23/13 (from the past 48 hour(s))  APTT     Status: None   Collection Time    03/23/13  9:10 AM      Result Value Range   aPTT 27  24 - 37 seconds  CBC     Status: Abnormal   Collection Time    03/23/13  9:10 AM      Result Value Range   WBC 2.4 (*) 4.0 - 10.5 K/uL   RBC 3.21 (*) 3.87 - 5.11 MIL/uL   Hemoglobin 9.3 (*) 12.0 - 15.0 g/dL   HCT 16.1 (*) 09.6 - 04.5 %   MCV 90.0  78.0 - 100.0 fL   MCH 29.0  26.0 - 34.0 pg   MCHC 32.2  30.0 - 36.0 g/dL   RDW 40.9 (*) 81.1 - 91.4 %   Platelets 92 (*) 150 - 400 K/uL   Comment: RESULT REPEATED AND VERIFIED     SPECIMEN CHECKED FOR CLOTS     PLATELET COUNT CONFIRMED BY SMEAR  PROTIME-INR     Status: None   Collection Time    03/23/13  9:10 AM      Result Value Range   Prothrombin Time 13.7  11.6 - 15.2 seconds   INR 1.07  0.00 - 1.49   No results found.  Review of Systems  Constitutional: Negative for fever.       Occ hot flashes  HENT:       Occ HA's  Respiratory: Negative for shortness of breath.        Occ cough  Cardiovascular: Negative for chest pain.  Gastrointestinal: Negative for nausea, vomiting and abdominal pain.  Musculoskeletal: Positive for neck pain.       Occ back pain    Blood pressure 127/60,  pulse 92, temperature 97.6 F (36.4 C), temperature source Oral, resp. rate 20, SpO2 99.00%. Physical Exam  Constitutional: She is oriented to person, place, and time. She appears well-developed and well-nourished.  Cardiovascular: Normal rate and regular rhythm.   Murmur heard. Respiratory: Effort normal and breath sounds normal.  GI: Soft. Bowel sounds are normal. There is no tenderness.  obese  Musculoskeletal: Normal range of motion. She exhibits edema.  Neurological: She is alert and oriented to person, place, and time.     Assessment/Plan Pt with hx of pancytopenia. Plan is for CT guided bone marrow biopsy today to r/o CLL/AML. Details/risks of procedure d/w pt/husband with their understanding and consent.  Tyson Parkison,D KEVIN 03/23/2013, 10:26 AM

## 2013-03-26 ENCOUNTER — Ambulatory Visit: Payer: Medicare Other | Admitting: Hematology and Oncology

## 2013-03-27 ENCOUNTER — Other Ambulatory Visit (HOSPITAL_BASED_OUTPATIENT_CLINIC_OR_DEPARTMENT_OTHER): Payer: Medicare Other | Admitting: Lab

## 2013-03-27 DIAGNOSIS — D61818 Other pancytopenia: Secondary | ICD-10-CM

## 2013-03-27 LAB — CBC WITH DIFFERENTIAL/PLATELET
Basophils Absolute: 0 10*3/uL (ref 0.0–0.1)
EOS%: 5 % (ref 0.0–7.0)
HGB: 9 g/dL — ABNORMAL LOW (ref 11.6–15.9)
LYMPH%: 49.9 % — ABNORMAL HIGH (ref 14.0–49.7)
MCH: 28.5 pg (ref 25.1–34.0)
MCV: 91.6 fL (ref 79.5–101.0)
MONO%: 4.4 % (ref 0.0–14.0)
RBC: 3.15 10*6/uL — ABNORMAL LOW (ref 3.70–5.45)
RDW: 18 % — ABNORMAL HIGH (ref 11.2–14.5)

## 2013-03-27 LAB — HOLD TUBE, BLOOD BANK

## 2013-03-28 ENCOUNTER — Other Ambulatory Visit: Payer: Medicare Other

## 2013-03-29 ENCOUNTER — Other Ambulatory Visit: Payer: Self-pay | Admitting: Hematology and Oncology

## 2013-03-29 ENCOUNTER — Ambulatory Visit: Payer: Medicare Other | Admitting: Lab

## 2013-03-29 ENCOUNTER — Telehealth: Payer: Self-pay | Admitting: *Deleted

## 2013-03-29 DIAGNOSIS — D72820 Lymphocytosis (symptomatic): Secondary | ICD-10-CM

## 2013-03-29 NOTE — Telephone Encounter (Signed)
Just spoke with pathologist. The patholgist suggested another blood test, I just put in the order Would appreciate if she can come in and have this test down asAP so we have more info

## 2013-03-29 NOTE — Telephone Encounter (Signed)
Pt's husband called,  States he and pt are anxious for results of Bone Marrow biopsy.  Asking if they can get results prior to next office visit scheduled for 11/17?

## 2013-03-29 NOTE — Telephone Encounter (Signed)
Explained order for new blood test.  Results take about 72 hrs to get back.  Husband can bring pt in this afternoon about 1:30 pm for lab. Instructed them Dr. Bertis Ruddy should have results by middle of next week.  He verbalized understanding and reports pt has been feeling very tired and aches.  Feels like she has the flu but not running any fevers.

## 2013-04-02 LAB — CHROMOSOME ANALYSIS, BONE MARROW

## 2013-04-03 ENCOUNTER — Other Ambulatory Visit (HOSPITAL_BASED_OUTPATIENT_CLINIC_OR_DEPARTMENT_OTHER): Payer: Medicare Other

## 2013-04-03 DIAGNOSIS — D61818 Other pancytopenia: Secondary | ICD-10-CM

## 2013-04-03 LAB — CBC WITH DIFFERENTIAL/PLATELET
BASO%: 0.9 % (ref 0.0–2.0)
EOS%: 5.7 % (ref 0.0–7.0)
LYMPH%: 49.8 % — ABNORMAL HIGH (ref 14.0–49.7)
MCH: 28.8 pg (ref 25.1–34.0)
MCHC: 31.7 g/dL (ref 31.5–36.0)
MCV: 91 fL (ref 79.5–101.0)
MONO#: 0.1 10*3/uL (ref 0.1–0.9)
MONO%: 4.1 % (ref 0.0–14.0)
Platelets: 139 10*3/uL — ABNORMAL LOW (ref 145–400)
RBC: 3.01 10*6/uL — ABNORMAL LOW (ref 3.70–5.45)
RDW: 17.9 % — ABNORMAL HIGH (ref 11.2–14.5)
WBC: 2.7 10*3/uL — ABNORMAL LOW (ref 3.9–10.3)

## 2013-04-03 LAB — HOLD TUBE, BLOOD BANK

## 2013-04-04 ENCOUNTER — Other Ambulatory Visit: Payer: Medicare Other

## 2013-04-09 ENCOUNTER — Ambulatory Visit (HOSPITAL_BASED_OUTPATIENT_CLINIC_OR_DEPARTMENT_OTHER): Payer: Medicare Other | Admitting: Hematology and Oncology

## 2013-04-09 ENCOUNTER — Telehealth: Payer: Self-pay | Admitting: Hematology and Oncology

## 2013-04-09 ENCOUNTER — Encounter: Payer: Self-pay | Admitting: Hematology and Oncology

## 2013-04-09 VITALS — BP 110/56 | HR 61 | Temp 97.5°F | Resp 18 | Ht 62.0 in | Wt 263.4 lb

## 2013-04-09 DIAGNOSIS — C91Z0 Other lymphoid leukemia not having achieved remission: Secondary | ICD-10-CM

## 2013-04-09 DIAGNOSIS — R609 Edema, unspecified: Secondary | ICD-10-CM

## 2013-04-09 DIAGNOSIS — D649 Anemia, unspecified: Secondary | ICD-10-CM

## 2013-04-09 DIAGNOSIS — R110 Nausea: Secondary | ICD-10-CM

## 2013-04-09 DIAGNOSIS — R600 Localized edema: Secondary | ICD-10-CM

## 2013-04-09 HISTORY — DX: Other lymphoid leukemia not having achieved remission: C91.Z0

## 2013-04-09 HISTORY — DX: Localized edema: R60.0

## 2013-04-09 HISTORY — DX: Nausea: R11.0

## 2013-04-09 MED ORDER — METHOTREXATE 2.5 MG PO TABS: 7.5000 mg | ORAL_TABLET | ORAL | Status: DC

## 2013-04-09 MED ORDER — ONDANSETRON HCL 8 MG PO TABS: 8.0000 mg | ORAL_TABLET | Freq: Three times a day (TID) | ORAL | Status: DC | PRN

## 2013-04-09 MED ORDER — PREDNISONE 20 MG PO TABS: 60.0000 mg | ORAL_TABLET | Freq: Every day | ORAL | Status: DC

## 2013-04-09 NOTE — Patient Instructions (Signed)
Methotrexate tablets What is this medicine? METHOTREXATE (METH oh TREX ate) is a chemotherapy drug. This medicine affects cells that are rapidly growing, such as cancer cells and cells in your mouth and stomach. It is used to treat many cancers and other medical conditions. It is used for leukemias, lymphomas, breast cancer, lung cancer, head and neck cancers, and other cancers. This medicine also works on the immune system and is commonly used to treat psoriasis and rheumatoid arthritis. If used for arthritis or psoriasis, the drug is only given once a week. This medicine may be used for other purposes; ask your health care provider or pharmacist if you have questions. COMMON BRAND NAME(S): Rheumatrex, Trexall What should I tell my health care provider before I take this medicine? They need to know if you have any of these conditions: -bleeding or blood disorders -HIV-positive or have acquired immunodeficiency syndrome (AIDS) -if you frequently drink alcohol-containing drinks -infection or weak immune system -kidney disease -liver disease -lung disease -stomach ulcers -ulcerative colitis -an unusual or allergic reaction to methotrexate, other medicines, foods, dyes, or preservatives -pregnant or trying to get pregnant -breast-feeding How should I use this medicine? Take this medicine by mouth. Swallow it with a full glass of water. Follow the directions on the prescription label. Do not take your medicine more often than directed. Finish the full course prescribed by your doctor or health care professional. Do not stop taking except on your doctor's advice. If you take methotrexate for rheumatoid arthritis or psoriasis, the dose is given only once a week. Do not take more frequently. Talk to your pediatrician regarding the use of this medicine in children. Special care may be needed. Overdosage: If you think you have taken too much of this medicine contact a poison control center or emergency  room at once. NOTE: This medicine is only for you. Do not share this medicine with others. What if I miss a dose? If you miss a dose, talk with your doctor or health care professional. Do not take double or extra doses. If you vomit after taking a dose, call your doctor or health care professional for advice. What may interact with this medicine? -antibiotics and other medicines for infections -aspirin and aspirin-like medicines including bismuth subsalicylate (Pepto-Bismol) -NSAIDs, medicines for pain and inflammation, like ibuprofen or naproxen -probenecid -trimetrexate -vaccines This list may not describe all possible interactions. Give your health care provider a list of all the medicines, herbs, non-prescription drugs, or dietary supplements you use. Also tell them if you smoke, drink alcohol, or use illegal drugs. Some items may interact with your medicine. What should I watch for while using this medicine? Visit your doctor or health care professional for checks on your progress. You will need to have regular blood checks. You will also need a chest X-ray before starting the medicine. If you take the medicine for rheumatoid arthritis or psoriasis, you may not see an improvement in your condition for several weeks. Do not drink alcohol-containing drinks while taking this medicine. Both alcohol and the medicine may cause damage to your liver. This medicine may increase your risk of getting an infection. Stay away from people who are sick. To protect your kidneys, drink water or other fluids as directed while you are taking this medicine. Both men and women must use effective birth control. Use 2 reliable forms of birth control together. Do not become pregnant while taking this medicine. Women should continue to use birth control until after their first normal  menstrual cycle after stopping the medicine. Call your doctor right away if you think you or your partner might be pregnant. There is a  potential for serious side effects to an unborn child. Talk to your health care professional or pharmacist for more information. Do not breast-feed an infant while taking this medicine. Men should continue to use birth control for at least 3 months after stopping the medicine. If you are going to have surgery or dental work, tell your health care professional that you are taking this medicine. This medicine can make you more sensitive to the sun. Keep out of the sun. If you cannot avoid being in the sun, wear protective clothing and use sunscreen. Do not use sun lamps or tanning beds/booths. What side effects may I notice from receiving this medicine? Side effects that you should report to your doctor or health care professional as soon as possible: -bruising, pinpoint red spots on the skin, black, tarry stools, blood in the urine -changes in vision -diarrhea -difficulty breathing or a dry cough -mouth and throat ulcers -redness, blistering, peeling or loosening of the skin, including inside the mouth -skin rash, hives, or itching -symptoms of infection like fever or chills, cough, sore throat, pain or difficulty passing urine -unusually weak or tired, fainting spells -vomiting -yellow coloring of skin or eyes Side effects that usually do not require medical attention (report to your doctor or health care professional if they continue or are bothersome): -dizziness -drowsiness -loss of appetite -nausea This list may not describe all possible side effects. Call your doctor for medical advice about side effects. You may report side effects to FDA at 1-800-FDA-1088. Where should I keep my medicine? Keep out of the reach of children. Store at room temperature between 20 and 25 degrees C (68 and 77 degrees F). Protect from light. Throw away any unused medicine after the expiration date. NOTE: This sheet is a summary. It may not cover all possible information. If you have questions about this  medicine, talk to your doctor, pharmacist, or health care provider.  2014, Elsevier/Gold Standard. (2007-11-16 11:12:16) Prednisone tablets What is this medicine? PREDNISONE (PRED Arnette Driggs sone) is a corticosteroid. It is commonly used to treat inflammation of the skin, joints, lungs, and other organs. Common conditions treated include asthma, allergies, and arthritis. It is also used for other conditions, such as blood disorders and diseases of the adrenal glands. This medicine may be used for other purposes; ask your health care provider or pharmacist if you have questions. COMMON BRAND NAME(S): Deltasone, Predone, Sterapred DS, Sterapred What should I tell my health care provider before I take this medicine? They need to know if you have any of these conditions: -Cushing's syndrome -diabetes -glaucoma -heart disease -high blood pressure -infection (especially a virus infection such as chickenpox, cold sores, or herpes) -kidney disease -liver disease -mental illness -myasthenia gravis -osteoporosis -seizures -stomach or intestine problems -thyroid disease -an unusual or allergic reaction to lactose, prednisone, other medicines, foods, dyes, or preservatives -pregnant or trying to get pregnant -breast-feeding How should I use this medicine? Take this medicine by mouth with a glass of water. Follow the directions on the prescription label. Take this medicine with food. If you are taking this medicine once a day, take it in the morning. Do not take more medicine than you are told to take. Do not suddenly stop taking your medicine because you may develop a severe reaction. Your doctor will tell you how much medicine to take. If  your doctor wants you to stop the medicine, the dose may be slowly lowered over time to avoid any side effects. Talk to your pediatrician regarding the use of this medicine in children. Special care may be needed. Overdosage: If you think you have taken too much of this  medicine contact a poison control center or emergency room at once. NOTE: This medicine is only for you. Do not share this medicine with others. What if I miss a dose? If you miss a dose, take it as soon as you can. If it is almost time for your next dose, talk to your doctor or health care professional. You may need to miss a dose or take an extra dose. Do not take double or extra doses without advice. What may interact with this medicine? Do not take this medicine with any of the following medications: -metyrapone -mifepristone This medicine may also interact with the following medications: -aminoglutethimide -amphotericin B -aspirin and aspirin-like medicines -barbiturates -certain medicines for diabetes, like glipizide or glyburide -cholestyramine -cholinesterase inhibitors -cyclosporine -digoxin -diuretics -ephedrine -female hormones, like estrogens and birth control pills -isoniazid -ketoconazole -NSAIDS, medicines for pain and inflammation, like ibuprofen or naproxen -phenytoin -rifampin -toxoids -vaccines -warfarin This list may not describe all possible interactions. Give your health care provider a list of all the medicines, herbs, non-prescription drugs, or dietary supplements you use. Also tell them if you smoke, drink alcohol, or use illegal drugs. Some items may interact with your medicine. What should I watch for while using this medicine? Visit your doctor or health care professional for regular checks on your progress. If you are taking this medicine over a prolonged period, carry an identification card with your name and address, the type and dose of your medicine, and your doctor's name and address. This medicine may increase your risk of getting an infection. Tell your doctor or health care professional if you are around anyone with measles or chickenpox, or if you develop sores or blisters that do not heal properly. If you are going to have surgery, tell your  doctor or health care professional that you have taken this medicine within the last twelve months. Ask your doctor or health care professional about your diet. You may need to lower the amount of salt you eat. This medicine may affect blood sugar levels. If you have diabetes, check with your doctor or health care professional before you change your diet or the dose of your diabetic medicine. What side effects may I notice from receiving this medicine? Side effects that you should report to your doctor or health care professional as soon as possible: -allergic reactions like skin rash, itching or hives, swelling of the face, lips, or tongue -changes in emotions or moods -changes in vision -depressed mood -eye pain -fever or chills, cough, sore throat, pain or difficulty passing urine -increased thirst -swelling of ankles, feet Side effects that usually do not require medical attention (report to your doctor or health care professional if they continue or are bothersome): -confusion, excitement, restlessness -headache -nausea, vomiting -skin problems, acne, thin and shiny skin -trouble sleeping -weight gain This list may not describe all possible side effects. Call your doctor for medical advice about side effects. You may report side effects to FDA at 1-800-FDA-1088. Where should I keep my medicine? Keep out of the reach of children. Store at room temperature between 15 and 30 degrees C (59 and 86 degrees F). Protect from light. Keep container tightly closed. Throw away any unused  medicine after the expiration date. NOTE: This sheet is a summary. It may not cover all possible information. If you have questions about this medicine, talk to your doctor, pharmacist, or health care provider.  2014, Elsevier/Gold Standard. (2010-12-24 10:57:14)

## 2013-04-09 NOTE — Telephone Encounter (Signed)
made appts per 11/17 POF adv pt [place] will call AVS and cal printed shh

## 2013-04-09 NOTE — Progress Notes (Signed)
[facility] OFFICE PROGRESS NOTE  Patient Care Team: Rafael Bihari, MD as PCP - General (Unknown Physician Specialty) Artis Delay, MD as Consulting Physician (Hematology and Oncology)  DIAGNOSIS: Pancytopenia, diagnosis of large granular lymphocytic leukemia  SUMMARY OF ONCOLOGIC HISTORY: This is a patient it was seen here approximately 4 months ago. She was found to have anemia and was placed on observation. The cause of the anemia was thought to be related to chronic kidney disease. The patient also has history of Roux-en-Y gastric bypass and it was thought to be the contributing factor  INTERVAL HISTORY: Stacey Cooley 72 y.o. female returns for further followup. CT scan of the chest, abdomen and pelvis show no evidence of lymphadenopathy. On 03/23/2013, bone marrow aspirate and biopsy, along with peripheral blood for T-cell rearrangement study, came back consistent with the diagnosis of large granular lymphocytic leukemia She complained a reason nausea and one episode of vomiting. She complained of significant leg cramps behind the knees and both legs. Of note the patient had chronic leg swelling from lymphedema. She is complaining of profound fatigue.  I have reviewed the past medical history, past surgical history, social history and family history with the patient and they are unchanged from previous note.  ALLERGIES:  is allergic to latex; penicillins; and sulfonamide derivatives.  MEDICATIONS:  Current Outpatient Prescriptions  Medication Sig Dispense Refill  . acetaminophen-codeine (TYLENOL #3) 300-30 MG per tablet Take 1 tablet by mouth 4 (four) times daily as needed for pain.       Marland Kitchen allopurinol (ZYLOPRIM) 300 MG tablet Take 300 mg by mouth every morning.       Marland Kitchen aspirin 81 MG tablet Take 81 mg by mouth daily.      . benazepril (LOTENSIN) 20 MG tablet Take 20 mg by mouth every evening.       . Calcium Carbonate-Vitamin D (CALCIUM 600+D) 600-400 MG-UNIT per  tablet Take 1 tablet by mouth daily.      . cholecalciferol (VITAMIN D) 1000 UNITS tablet Take 1,000 Units by mouth 2 (two) times daily.      . citalopram (CELEXA) 20 MG tablet Take 20 mg by mouth every morning.       . Cyanocobalamin (VITAMIN B-12 SL) Place 1 tablet under the tongue daily.      . cyclobenzaprine (FLEXERIL) 5 MG tablet Take 1 tablet by mouth 2 (two) times daily.      . fluticasone (FLONASE) 50 MCG/ACT nasal spray Place 3 sprays into the nose daily.      . furosemide (LASIX) 40 MG tablet Take 40 mg by mouth daily as needed for fluid.       Marland Kitchen levothyroxine (SYNTHROID, LEVOTHROID) 75 MCG tablet Take 75 mcg by mouth daily before breakfast.       . metoprolol tartrate (LOPRESSOR) 25 MG tablet Take 25 mg by mouth 2 (two) times daily.        . Multiple Vitamin (MULTIVITAMIN WITH MINERALS) TABS Take 1 tablet by mouth daily. Centrum      . oxybutynin (DITROPAN) 5 MG tablet Take 5 mg by mouth Daily.      . Potassium Gluconate 550 MG TABS Take 1 tablet by mouth daily.      . methotrexate (RHEUMATREX) 2.5 MG tablet Take 3 tablets (7.5 mg total) by mouth once a week. Caution:Chemotherapy. Protect from light.  12 tablet  3  . ondansetron (ZOFRAN) 8 MG tablet Take 1 tablet (8 mg total) by mouth every 8 (  eight) hours as needed for nausea.  30 tablet  3  . predniSONE (DELTASONE) 20 MG tablet Take 3 tablets (60 mg total) by mouth daily with breakfast.  90 tablet  1   No current facility-administered medications for this visit.    REVIEW OF SYSTEMS:   Constitutional: Denies fevers, chills or abnormal weight loss All other systems were reviewed with the patient and are negative.  PHYSICAL EXAMINATION: ECOG PERFORMANCE STATUS: 2 - Symptomatic, <50% confined to bed  Filed Vitals:   04/09/13 0833  BP: 110/56  Pulse: 61  Temp: 97.5 F (36.4 C)  Resp: 18   Filed Weights   04/09/13 0833  Weight: 263 lb 6.4 oz (119.477 kg)    GENERAL:alert, no distress and comfortable. The patient will  morbidly obese NEURO: alert & oriented x 3 with fluent speech, no focal motor/sensory deficits Noted bilateral chronic lower extremity edema LABORATORY DATA:  I have reviewed the data as listed    Component Value Date/Time   NA 143 03/12/2013 1507   NA 144 08/02/2012 1500   K 5.2* 03/12/2013 1507   K 4.0 08/02/2012 1500   CL 105 10/25/2012 1135   CL 103 08/02/2012 1500   CO2 21* 03/12/2013 1507   CO2 28 08/02/2012 1500   GLUCOSE 92 03/12/2013 1507   GLUCOSE 85 10/25/2012 1135   GLUCOSE 76 08/02/2012 1500   BUN 29.5* 03/12/2013 1507   BUN 15 08/02/2012 1500   CREATININE 1.3* 03/12/2013 1507   CREATININE 0.94 08/02/2012 1500   CREATININE 1.22* 09/08/2011 1120   CALCIUM 9.6 03/12/2013 1507   CALCIUM 10.3 08/02/2012 1500   PROT 6.7 03/12/2013 1507   PROT 7.9 08/02/2012 1500   ALBUMIN 3.9 03/12/2013 1507   ALBUMIN 4.1 08/02/2012 1500   AST 32 03/12/2013 1507   AST 29 08/02/2012 1500   ALT 24 03/12/2013 1507   ALT 38* 08/02/2012 1500   ALKPHOS 94 03/12/2013 1507   ALKPHOS 151* 08/02/2012 1500   BILITOT 0.65 03/12/2013 1507   BILITOT 0.3 08/02/2012 1500   GFRNONAA 61* 08/02/2012 1500   GFRAA 70* 08/02/2012 1500    No results found for this basename: SPEP,  UPEP,   kappa and lambda light chains    Lab Results  Component Value Date   WBC 2.7* 04/03/2013   NEUTROABS 1.1* 04/03/2013   HGB 8.7* 04/03/2013   HCT 27.4* 04/03/2013   MCV 91.0 04/03/2013   PLT 139* 04/03/2013      Chemistry      Component Value Date/Time   NA 143 03/12/2013 1507   NA 144 08/02/2012 1500   K 5.2* 03/12/2013 1507   K 4.0 08/02/2012 1500   CL 105 10/25/2012 1135   CL 103 08/02/2012 1500   CO2 21* 03/12/2013 1507   CO2 28 08/02/2012 1500   BUN 29.5* 03/12/2013 1507   BUN 15 08/02/2012 1500   CREATININE 1.3* 03/12/2013 1507   CREATININE 0.94 08/02/2012 1500   CREATININE 1.22* 09/08/2011 1120      Component Value Date/Time   CALCIUM 9.6 03/12/2013 1507   CALCIUM 10.3 08/02/2012 1500   ALKPHOS 94 03/12/2013 1507    ALKPHOS 151* 08/02/2012 1500   AST 32 03/12/2013 1507   AST 29 08/02/2012 1500   ALT 24 03/12/2013 1507   ALT 38* 08/02/2012 1500   BILITOT 0.65 03/12/2013 1507   BILITOT 0.3 08/02/2012 1500    ASSESSMENT & PLAN:  #1 LGL leukemia I discussed with the patient and family members the  natural history of LGL leukemia. Patient education information was given. Due to her leukopenia and anemia, the patient requires treatment. I discussed with her the risk, benefits, side effects of treatment using combination therapy with methotrexate and prednisone. We can start with 7.5 mg of methotrexate by mouth once a week and prednisone 60 mg daily. Patient education material was dispensed The patient will be seeing him on a weekly basis. Risk of infection and worsening anemia requiring blood transfusion were discussed. #2 anemia This is likely anemia of chronic disease and LGL. The patient denies recent history of bleeding such as epistaxis, hematuria or hematochezia. She is asymptomatic from the anemia. We will observe for now.  She does not require transfusion now.  #3 leg edema I recommend ultrasound venous Doppler to rule out DVT. Orders Placed This Encounter  Procedures  . US Venous Img Lower Bilateral    Standing Status: Future     Number of Occurrences:      Standing Expiration Date: 04/09/2014    Order Specific Question:  Reason for exam:    Answer:  bilateral leg swelling and pain, r/o DVT    Order Specific Question:  Preferred imaging location?    Answer:  [facility]  . CBC with Differential    Standing Status: Standing     Number of Occurrences: 6     Standing Expiration Date: 04/09/2014  . Comprehensive metabolic panel    Standing Status: Standing     Number of Occurrences: 6     Standing Expiration Date: 04/09/2014   All questions were answered. The patient knows to call the clinic with any problems, questions or concerns. No barriers to learning was detected. I spent 40  minutes counseling the patient face to face. The total time spent in the appointment was 60 minutes and more than 50% was on counseling and review of test results     [facility], Ritter Helsley, MD 04/09/2013 9:35 AM

## 2013-04-16 ENCOUNTER — Other Ambulatory Visit (HOSPITAL_BASED_OUTPATIENT_CLINIC_OR_DEPARTMENT_OTHER): Payer: Medicare Other | Admitting: Lab

## 2013-04-16 ENCOUNTER — Encounter: Payer: Self-pay | Admitting: Hematology and Oncology

## 2013-04-16 ENCOUNTER — Ambulatory Visit (HOSPITAL_BASED_OUTPATIENT_CLINIC_OR_DEPARTMENT_OTHER): Payer: Medicare Other | Admitting: Hematology and Oncology

## 2013-04-16 ENCOUNTER — Telehealth: Payer: Self-pay | Admitting: Hematology and Oncology

## 2013-04-16 VITALS — BP 115/54 | HR 60 | Temp 98.1°F | Resp 18 | Ht 62.0 in | Wt 236.5 lb

## 2013-04-16 DIAGNOSIS — R7989 Other specified abnormal findings of blood chemistry: Secondary | ICD-10-CM

## 2013-04-16 DIAGNOSIS — C91Z0 Other lymphoid leukemia not having achieved remission: Secondary | ICD-10-CM

## 2013-04-16 DIAGNOSIS — D649 Anemia, unspecified: Secondary | ICD-10-CM

## 2013-04-16 DIAGNOSIS — E875 Hyperkalemia: Secondary | ICD-10-CM

## 2013-04-16 DIAGNOSIS — N179 Acute kidney failure, unspecified: Secondary | ICD-10-CM

## 2013-04-16 DIAGNOSIS — IMO0002 Reserved for concepts with insufficient information to code with codable children: Secondary | ICD-10-CM | POA: Insufficient documentation

## 2013-04-16 DIAGNOSIS — D72819 Decreased white blood cell count, unspecified: Secondary | ICD-10-CM

## 2013-04-16 DIAGNOSIS — R233 Spontaneous ecchymoses: Secondary | ICD-10-CM

## 2013-04-16 HISTORY — DX: Other specified abnormal findings of blood chemistry: R79.89

## 2013-04-16 HISTORY — DX: Reserved for concepts with insufficient information to code with codable children: IMO0002

## 2013-04-16 LAB — COMPREHENSIVE METABOLIC PANEL (CC13)
ALT: 17 U/L (ref 0–55)
AST: 22 U/L (ref 5–34)
Albumin: 4 g/dL (ref 3.5–5.0)
Alkaline Phosphatase: 73 U/L (ref 40–150)
Anion Gap: 9 mEq/L (ref 3–11)
BUN: 38 mg/dL — ABNORMAL HIGH (ref 7.0–26.0)
Calcium: 8.8 mg/dL (ref 8.4–10.4)
Chloride: 113 mEq/L — ABNORMAL HIGH (ref 98–109)
Creatinine: 1.9 mg/dL — ABNORMAL HIGH (ref 0.6–1.1)
Glucose: 86 mg/dl (ref 70–140)
Potassium: 5.4 mEq/L — ABNORMAL HIGH (ref 3.5–5.1)
Total Bilirubin: 0.59 mg/dL (ref 0.20–1.20)

## 2013-04-16 LAB — TECHNOLOGIST REVIEW: Technologist Review: 30

## 2013-04-16 LAB — CBC WITH DIFFERENTIAL/PLATELET
BASO%: 0.4 % (ref 0.0–2.0)
EOS%: 0.3 % (ref 0.0–7.0)
Eosinophils Absolute: 0 10*3/uL (ref 0.0–0.5)
LYMPH%: 27.8 % (ref 14.0–49.7)
MCH: 28.5 pg (ref 25.1–34.0)
MCHC: 31.3 g/dL — ABNORMAL LOW (ref 31.5–36.0)
MCV: 91.1 fL (ref 79.5–101.0)
MONO%: 2.6 % (ref 0.0–14.0)
RDW: 18 % — ABNORMAL HIGH (ref 11.2–14.5)
lymph#: 0.6 10*3/uL — ABNORMAL LOW (ref 0.9–3.3)

## 2013-04-16 NOTE — Progress Notes (Signed)
[facility] OFFICE PROGRESS NOTE  Patient Care Team: Rafael Bihari, MD as PCP - General (Unknown Physician Specialty) Artis Delay, MD as Consulting Physician (Hematology and Oncology)  DIAGNOSIS: LGL leukemia on treatment  SUMMARY OF ONCOLOGIC HISTORY: This is a patient it was seen here approximately 4 months ago. She was found to have anemia and was placed on observation. The cause of the anemia was thought to be related to chronic kidney disease. The patient also has history of Roux-en-Y gastric bypass and it was thought to be the contributing factor. 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 On 04/09/2013, she was started on prednisone 60 mg daily and methotrexate 7.5 mg once a week by mouth INTERVAL HISTORY: Stacey Cooley 72 y.o. female returns for further followup. She complained of dry mouth, or easy bruising, urinary frequency without dysuria. She denies any recent fever, chills, night sweats or abnormal weight loss The patient denies any mouth sores, nausea, vomiting or change in bowel habits  I have reviewed the past medical history, past surgical history, social history and family history with the patient and they are unchanged from previous note.  ALLERGIES:  is allergic to latex; penicillins; and sulfonamide derivatives.  MEDICATIONS:  Current Outpatient Prescriptions  Medication Sig Dispense Refill  . acetaminophen-codeine (TYLENOL #3) 300-30 MG per tablet Take 1 tablet by mouth 4 (four) times daily as needed for pain.       Marland Kitchen allopurinol (ZYLOPRIM) 300 MG tablet Take 300 mg by mouth every morning.       . benazepril (LOTENSIN) 20 MG tablet Take 20 mg by mouth every evening.       . Calcium Carbonate-Vitamin D (CALCIUM 600+D) 600-400 MG-UNIT per tablet Take 1 tablet by mouth daily.      . cholecalciferol (VITAMIN D) 1000 UNITS tablet Take 1,000 Units by mouth 2 (two) times  daily.      . citalopram (CELEXA) 20 MG tablet Take 20 mg by mouth every morning.       . Cyanocobalamin (VITAMIN B-12 SL) Place 1 tablet under the tongue daily.      . cyclobenzaprine (FLEXERIL) 5 MG tablet Take 1 tablet by mouth 2 (two) times daily.      . fluticasone (FLONASE) 50 MCG/ACT nasal spray Place 3 sprays into the nose daily.      . furosemide (LASIX) 40 MG tablet Take 40 mg by mouth daily as needed for fluid.       Marland Kitchen levothyroxine (SYNTHROID, LEVOTHROID) 75 MCG tablet Take 75 mcg by mouth daily before breakfast.       . methotrexate (RHEUMATREX) 2.5 MG tablet Take 3 tablets (7.5 mg total) by mouth once a week. Caution:Chemotherapy. Protect from light.  12 tablet  3  . metoprolol tartrate (LOPRESSOR) 25 MG tablet Take 25 mg by mouth 2 (two) times daily.        . Multiple Vitamin (MULTIVITAMIN WITH MINERALS) TABS Take 1 tablet by mouth daily. Centrum      . ondansetron (ZOFRAN) 8 MG tablet Take 1 tablet (8 mg total) by mouth every 8 (eight) hours as needed for nausea.  30 tablet  3  . oxybutynin (DITROPAN) 5 MG tablet Take 5 mg by mouth Daily.      . Potassium Gluconate 550 MG TABS Take 1 tablet by mouth daily.      . predniSONE (DELTASONE) 20 MG tablet Take 3 tablets (60 mg total)  by mouth daily with breakfast.  90 tablet  1   No current facility-administered medications for this visit.    REVIEW OF SYSTEMS:   Constitutional: Denies fevers, chills or abnormal weight loss Eyes: Denies blurriness of vision Ears, nose, mouth, throat, and face: Denies mucositis or sore throat Respiratory: Denies cough, dyspnea or wheezes Cardiovascular: Denies palpitation, chest discomfort or lower extremity swelling Gastrointestinal:  Denies nausea, heartburn or change in bowel habits Skin: Denies abnormal skin rashes Neurological:Denies numbness, tingling or new weaknesses Behavioral/Psych: Mood is stable, no new changes  All other systems were reviewed with the patient and are  negative.  PHYSICAL EXAMINATION: ECOG PERFORMANCE STATUS: 1 - Symptomatic but completely ambulatory  Filed Vitals:   04/16/13 0904  BP: 115/54  Pulse: 60  Temp: 98.1 F (36.7 C)  Resp: 18   Filed Weights   04/16/13 0904  Weight: 236 lb 8 oz (107.276 kg)    GENERAL:alert, no distress and comfortable. She is morbidly obese SKIN: skin color, texture, turgor are normal, no rashes or significant lesions EYES: normal, Conjunctiva are pink and non-injected, sclera clear OROPHARYNX:no exudate, no erythema and lips, buccal mucosa, and tongue normal  NEURO: alert & oriented x 3 with fluent speech, no focal motor/sensory deficits  LABORATORY DATA:  I have reviewed the data as listed    Component Value Date/Time   NA 139 04/16/2013 0824   NA 144 08/02/2012 1500   K 5.4* 04/16/2013 0824   K 4.0 08/02/2012 1500   CL 105 10/25/2012 1135   CL 103 08/02/2012 1500   CO2 17* 04/16/2013 0824   CO2 28 08/02/2012 1500   GLUCOSE 86 04/16/2013 0824   GLUCOSE 85 10/25/2012 1135   GLUCOSE 76 08/02/2012 1500   BUN 38.0* 04/16/2013 0824   BUN 15 08/02/2012 1500   CREATININE 1.9* 04/16/2013 0824   CREATININE 0.94 08/02/2012 1500   CREATININE 1.22* 09/08/2011 1120   CALCIUM 8.8 04/16/2013 0824   CALCIUM 10.3 08/02/2012 1500   PROT 6.9 04/16/2013 0824   PROT 7.9 08/02/2012 1500   ALBUMIN 4.0 04/16/2013 0824   ALBUMIN 4.1 08/02/2012 1500   AST 22 04/16/2013 0824   AST 29 08/02/2012 1500   ALT 17 04/16/2013 0824   ALT 38* 08/02/2012 1500   ALKPHOS 73 04/16/2013 0824   ALKPHOS 151* 08/02/2012 1500   BILITOT 0.59 04/16/2013 0824   BILITOT 0.3 08/02/2012 1500   GFRNONAA 61* 08/02/2012 1500   GFRAA 70* 08/02/2012 1500    No results found for this basename: SPEP,  UPEP,   kappa and lambda light chains    Lab Results  Component Value Date   WBC 2.1* 04/16/2013   NEUTROABS 1.4* 04/16/2013   HGB 8.2* 04/16/2013   HCT 26.3* 04/16/2013   MCV 91.1 04/16/2013   PLT 147 04/16/2013      Chemistry       Component Value Date/Time   NA 139 04/16/2013 0824   NA 144 08/02/2012 1500   K 5.4* 04/16/2013 0824   K 4.0 08/02/2012 1500   CL 105 10/25/2012 1135   CL 103 08/02/2012 1500   CO2 17* 04/16/2013 0824   CO2 28 08/02/2012 1500   BUN 38.0* 04/16/2013 0824   BUN 15 08/02/2012 1500   CREATININE 1.9* 04/16/2013 0824   CREATININE 0.94 08/02/2012 1500   CREATININE 1.22* 09/08/2011 1120      Component Value Date/Time   CALCIUM 8.8 04/16/2013 0824   CALCIUM 10.3 08/02/2012 1500   ALKPHOS 73 04/16/2013  0824   ALKPHOS 151* 08/02/2012 1500   AST 22 04/16/2013 0824   AST 29 08/02/2012 1500   ALT 17 04/16/2013 0824   ALT 38* 08/02/2012 1500   BILITOT 0.59 04/16/2013 0824   BILITOT 0.3 08/02/2012 1500      ASSESSMENT & PLAN:  #1 large granular lymphocytic leukemia She is responding well to treatment. Even though she is becoming a bit more anemic, her thrombocytopenia has resolved. Her absolute neutrophil count has improved. Unfortunately she is experiencing multiple side effects. I suspect the prednisone is causing high blood sugar, causing increased urinary frequency and dehydration. I recommend we reduce prednisone to 40 mg daily. She will continue to be seeing him on a weekly basis. #2 anemia This is likely due to recent treatment. The patient denies recent history of bleeding such as epistaxis, hematuria or hematochezia. She is asymptomatic from the anemia. I will observe for now.  She does not require transfusion now. I will continue the chemotherapy at current dose without dosage adjustment.  If the anemia gets progressive worse in the future, I might have to delay her treatment or adjust the chemotherapy dose. #3 acute renal failure and hyperkalemia This is due to side effects from treatment. I am holding Lasix, potassium replacement therapy, as well as benazepril I will bring her back in 2 days to repeat blood work to make sure that her kidney function is not worse. If her hyperkalemia is worse  than 5.4, she would need to be prescribed Kayexalate If her creatinine is more than 1.9, she would need to be admitted for treatment of acute renal failure. Have asked her to hold methotrexate next week until I see her back. #4 easy bruising This is due to prednisone. As mentioned above I'm reducing the dose of her prednisone. #5 leukopenia She is asymptomatic and we will observe for now.  Orders Placed This Encounter  Procedures  . Basic metabolic panel    Standing Status: Future     Number of Occurrences:      Standing Expiration Date: 04/16/2014  . CBC with Differential    Standing Status: Future     Number of Occurrences:      Standing Expiration Date: 01/06/2014  . Comprehensive metabolic panel    Standing Status: Future     Number of Occurrences:      Standing Expiration Date: 04/16/2014   All questions were answered. The patient knows to call the clinic with any problems, questions or concerns. No barriers to learning was detected. Stacey Hennigan, MD 04/16/2013 10:03 AM

## 2013-04-16 NOTE — Telephone Encounter (Signed)
gv and printed appt sched and avs for pt for NOV and DEC °

## 2013-04-18 ENCOUNTER — Other Ambulatory Visit (HOSPITAL_BASED_OUTPATIENT_CLINIC_OR_DEPARTMENT_OTHER): Payer: Medicare Other

## 2013-04-18 ENCOUNTER — Other Ambulatory Visit: Payer: Self-pay | Admitting: *Deleted

## 2013-04-18 DIAGNOSIS — C91Z0 Other lymphoid leukemia not having achieved remission: Secondary | ICD-10-CM

## 2013-04-18 DIAGNOSIS — R7989 Other specified abnormal findings of blood chemistry: Secondary | ICD-10-CM

## 2013-04-18 LAB — BASIC METABOLIC PANEL (CC13)
Anion Gap: 10 mEq/L (ref 3–11)
BUN: 36.8 mg/dL — ABNORMAL HIGH (ref 7.0–26.0)
CO2: 18 mEq/L — ABNORMAL LOW (ref 22–29)
Glucose: 96 mg/dl (ref 70–140)
Potassium: 5.5 mEq/L — ABNORMAL HIGH (ref 3.5–5.1)
Sodium: 139 mEq/L (ref 136–145)

## 2013-04-18 MED ORDER — SODIUM POLYSTYRENE SULFONATE 15 GM/60ML PO SUSP: ORAL | Status: DC

## 2013-04-18 NOTE — Telephone Encounter (Signed)
Pt instructed to take kayexcelate daily beginning today until she has a bowel movement. (max 3 doses). Pt told to drink plenty of fluids. Appointment with Dr Bertis Ruddy and labs to be changed to Monday 04/23/13 @ 3:00 and 3:30-pt aware. Also told her to call for any problems-nausea or dizziness. Pt knows she is not to take methotrexate on Monday, is to take prednisone as scheduled.

## 2013-04-20 ENCOUNTER — Telehealth: Payer: Self-pay | Admitting: Hematology and Oncology

## 2013-04-20 NOTE — Telephone Encounter (Signed)
lvm for pt regarding to 12.2 appt changed to 12.1.14 per Dr request

## 2013-04-23 ENCOUNTER — Other Ambulatory Visit (HOSPITAL_BASED_OUTPATIENT_CLINIC_OR_DEPARTMENT_OTHER): Payer: Medicare Other | Admitting: Lab

## 2013-04-23 ENCOUNTER — Telehealth: Payer: Self-pay | Admitting: Hematology and Oncology

## 2013-04-23 ENCOUNTER — Encounter (INDEPENDENT_AMBULATORY_CARE_PROVIDER_SITE_OTHER): Payer: Self-pay

## 2013-04-23 ENCOUNTER — Ambulatory Visit (HOSPITAL_BASED_OUTPATIENT_CLINIC_OR_DEPARTMENT_OTHER): Payer: Medicare Other | Admitting: Hematology and Oncology

## 2013-04-23 VITALS — BP 151/64 | HR 77 | Temp 97.2°F | Resp 18 | Ht 62.0 in | Wt 231.9 lb

## 2013-04-23 DIAGNOSIS — D72819 Decreased white blood cell count, unspecified: Secondary | ICD-10-CM

## 2013-04-23 DIAGNOSIS — C91Z0 Other lymphoid leukemia not having achieved remission: Secondary | ICD-10-CM

## 2013-04-23 DIAGNOSIS — D696 Thrombocytopenia, unspecified: Secondary | ICD-10-CM

## 2013-04-23 DIAGNOSIS — R7989 Other specified abnormal findings of blood chemistry: Secondary | ICD-10-CM

## 2013-04-23 DIAGNOSIS — D649 Anemia, unspecified: Secondary | ICD-10-CM

## 2013-04-23 DIAGNOSIS — N179 Acute kidney failure, unspecified: Secondary | ICD-10-CM

## 2013-04-23 LAB — COMPREHENSIVE METABOLIC PANEL (CC13)
ALT: 23 U/L (ref 0–55)
AST: 25 U/L (ref 5–34)
Alkaline Phosphatase: 64 U/L (ref 40–150)
Anion Gap: 11 mEq/L (ref 3–11)
BUN: 23.6 mg/dL (ref 7.0–26.0)
CO2: 24 mEq/L (ref 22–29)
Calcium: 9.7 mg/dL (ref 8.4–10.4)
Creatinine: 1.4 mg/dL — ABNORMAL HIGH (ref 0.6–1.1)
Glucose: 86 mg/dl (ref 70–140)
Sodium: 141 mEq/L (ref 136–145)
Total Bilirubin: 0.64 mg/dL (ref 0.20–1.20)

## 2013-04-23 LAB — CBC WITH DIFFERENTIAL/PLATELET
BASO%: 0.7 % (ref 0.0–2.0)
Basophils Absolute: 0 10*3/uL (ref 0.0–0.1)
EOS%: 0.8 % (ref 0.0–7.0)
HCT: 25.9 % — ABNORMAL LOW (ref 34.8–46.6)
HGB: 8 g/dL — ABNORMAL LOW (ref 11.6–15.9)
LYMPH%: 53.4 % — ABNORMAL HIGH (ref 14.0–49.7)
MCH: 28.8 pg (ref 25.1–34.0)
MCHC: 31 g/dL — ABNORMAL LOW (ref 31.5–36.0)
MCV: 92.9 fL (ref 79.5–101.0)
MONO%: 4.5 % (ref 0.0–14.0)
NEUT#: 1 10*3/uL — ABNORMAL LOW (ref 1.5–6.5)
NEUT%: 40.6 % (ref 38.4–76.8)
RBC: 2.79 10*6/uL — ABNORMAL LOW (ref 3.70–5.45)
lymph#: 1.3 10*3/uL (ref 0.9–3.3)

## 2013-04-23 NOTE — Progress Notes (Signed)
[name] OFFICE PROGRESS NOTE  Patient Care Team: Rafael Bihari, MD as PCP - General (Unknown Physician Specialty) Artis Delay, MD as Consulting Physician (Hematology and Oncology)  DIAGNOSIS: LGL leukemia, recent acute renal failure  SUMMARY OF ONCOLOGIC HISTORY: This is a patient it was seen here approximately 4 months ago. She was found to have anemia and was placed on observation. The cause of the anemia was thought to be related to chronic kidney disease. The patient also has history of Roux-en-Y gastric bypass and it was thought to be the contributing factor. 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 On 04/09/2013, she was started on prednisone 60 mg daily and methotrexate 7.5 mg once a week by mouth On 04/16/2013, she developed acute renal failure with hyperkalemia. Prednisone dose was reduced to 40 mg a day and methotrexate was placed on hold INTERVAL HISTORY: Stacey Cooley 72 y.o. female returns for further followup. Last week she has severe hyperkalemia. She was instructed to drink plenty of fluids and was prescribed Kayexalate for hyperkalemia The patient had mild fatigue. Denies any dizziness, shortness breath or chest pain on exertion. She denies any recent fever, chills, night sweats or abnormal weight loss The patient denies any recent signs or symptoms of bleeding such as spontaneous epistaxis, hematuria or hematochezia.  I have reviewed the past medical history, past surgical history, social history and family history with the patient and they are unchanged from previous note.  ALLERGIES:  is allergic to latex; penicillins; and sulfonamide derivatives.  MEDICATIONS:  Current Outpatient Prescriptions  Medication Sig Dispense Refill  . acetaminophen-codeine (TYLENOL #3) 300-30 MG per tablet Take 1 tablet by mouth 4 (four) times daily as needed for pain.       Marland Kitchen allopurinol  (ZYLOPRIM) 300 MG tablet Take 300 mg by mouth every morning.       . benazepril (LOTENSIN) 20 MG tablet Take 20 mg by mouth every evening.       . Calcium Carbonate-Vitamin D (CALCIUM 600+D) 600-400 MG-UNIT per tablet Take 1 tablet by mouth daily.      . cholecalciferol (VITAMIN D) 1000 UNITS tablet Take 1,000 Units by mouth 2 (two) times daily.      . citalopram (CELEXA) 20 MG tablet Take 20 mg by mouth every morning.       . Cyanocobalamin (VITAMIN B-12 SL) Place 1 tablet under the tongue daily.      . cyclobenzaprine (FLEXERIL) 5 MG tablet Take 1 tablet by mouth 2 (two) times daily.      . fluticasone (FLONASE) 50 MCG/ACT nasal spray Place 3 sprays into the nose daily.      . furosemide (LASIX) 40 MG tablet Take 40 mg by mouth daily as needed for fluid.       Marland Kitchen levothyroxine (SYNTHROID, LEVOTHROID) 75 MCG tablet Take 75 mcg by mouth daily before breakfast.       . methotrexate (RHEUMATREX) 2.5 MG tablet Take 3 tablets (7.5 mg total) by mouth once a week. Caution:Chemotherapy. Protect from light.  12 tablet  3  . metoprolol tartrate (LOPRESSOR) 25 MG tablet Take 25 mg by mouth 2 (two) times daily.        . Multiple Vitamin (MULTIVITAMIN WITH MINERALS) TABS Take 1 tablet by mouth daily. Centrum      . ondansetron (ZOFRAN) 8 MG tablet Take 1 tablet (8 mg total) by mouth every 8 (eight) hours as needed for nausea.  30 tablet  3  . oxybutynin (DITROPAN) 5 MG tablet Take 5 mg by mouth Daily.      . predniSONE (DELTASONE) 20 MG tablet Take 40 mg by mouth daily with breakfast.       No current facility-administered medications for this visit.    REVIEW OF SYSTEMS:   Constitutional: Denies fevers, chills or abnormal weight loss Eyes: Denies blurriness of vision Ears, nose, mouth, throat, and face: Denies mucositis or sore throat Respiratory: Denies cough, dyspnea or wheezes Cardiovascular: Denies palpitation, chest discomfort or lower extremity swelling Gastrointestinal:  Denies nausea, heartburn  or change in bowel habits Skin: Denies abnormal skin rashes Lymphatics: Denies new lymphadenopathy or easy bruising Neurological:Denies numbness, tingling or new weaknesses Behavioral/Psych: Mood is stable, no new changes  All other systems were reviewed with the patient and are negative.  PHYSICAL EXAMINATION: ECOG PERFORMANCE STATUS: 2 - Symptomatic, <50% confined to bed  Filed Vitals:   04/23/13 1538  BP: 151/64  Pulse: 77  Temp: 97.2 F (36.2 C)  Resp: 18   Filed Weights   04/23/13 1538  Weight: 231 lb 14.4 oz (105.189 kg)    GENERAL:alert, no distress and comfortable. Patient is morbidly obese SKIN: The patient will pale. No skin rashes Musculoskeletal:no cyanosis of digits and no clubbing  NEURO: alert & oriented x 3 with fluent speech, no focal motor/sensory deficits  LABORATORY DATA:  I have reviewed the data as listed    Component Value Date/Time   NA 141 04/23/2013 1505   NA 144 08/02/2012 1500   K 3.7 04/23/2013 1505   K 4.0 08/02/2012 1500   CL 105 10/25/2012 1135   CL 103 08/02/2012 1500   CO2 24 04/23/2013 1505   CO2 28 08/02/2012 1500   GLUCOSE 86 04/23/2013 1505   GLUCOSE 85 10/25/2012 1135   GLUCOSE 76 08/02/2012 1500   BUN 23.6 04/23/2013 1505   BUN 15 08/02/2012 1500   CREATININE 1.4* 04/23/2013 1505   CREATININE 0.94 08/02/2012 1500   CREATININE 1.22* 09/08/2011 1120   CALCIUM 9.7 04/23/2013 1505   CALCIUM 10.3 08/02/2012 1500   PROT 6.5 04/23/2013 1505   PROT 7.9 08/02/2012 1500   ALBUMIN 4.1 04/23/2013 1505   ALBUMIN 4.1 08/02/2012 1500   AST 25 04/23/2013 1505   AST 29 08/02/2012 1500   ALT 23 04/23/2013 1505   ALT 38* 08/02/2012 1500   ALKPHOS 64 04/23/2013 1505   ALKPHOS 151* 08/02/2012 1500   BILITOT 0.64 04/23/2013 1505   BILITOT 0.3 08/02/2012 1500   GFRNONAA 61* 08/02/2012 1500   GFRAA 70* 08/02/2012 1500    No results found for this basename: SPEP,  UPEP,   kappa and lambda light chains    Lab Results  Component Value Date   WBC 2.4* 04/23/2013    NEUTROABS 1.0* 04/23/2013   HGB 8.0* 04/23/2013   HCT 25.9* 04/23/2013   MCV 92.9 04/23/2013   PLT 123* 04/23/2013      Chemistry      Component Value Date/Time   NA 141 04/23/2013 1505   NA 144 08/02/2012 1500   K 3.7 04/23/2013 1505   K 4.0 08/02/2012 1500   CL 105 10/25/2012 1135   CL 103 08/02/2012 1500   CO2 24 04/23/2013 1505   CO2 28 08/02/2012 1500   BUN 23.6 04/23/2013 1505   BUN 15 08/02/2012 1500   CREATININE 1.4* 04/23/2013 1505   CREATININE 0.94 08/02/2012 1500   CREATININE 1.22* 09/08/2011 1120  Component Value Date/Time   CALCIUM 9.7 04/23/2013 1505   CALCIUM 10.3 08/02/2012 1500   ALKPHOS 64 04/23/2013 1505   ALKPHOS 151* 08/02/2012 1500   AST 25 04/23/2013 1505   AST 29 08/02/2012 1500   ALT 23 04/23/2013 1505   ALT 38* 08/02/2012 1500   BILITOT 0.64 04/23/2013 1505   BILITOT 0.3 08/02/2012 1500      ASSESSMENT & PLAN:  #1 LGL leukemia Since we held the methotrexate, should become leukopenic again. She is becoming more anemic and from thrombocytopenia has recurred. Due to side effects of prednisone, and I am reducing 8 to 20 mg daily. I asked her to resume methotrexate 7.5 mg once a week start today. Due to high risk of toxicity, I will see her back in 4 days  time with repeat blood work. #2 anemia This is likely due to recent treatment. The patient denies recent history of bleeding such as epistaxis, hematuria or hematochezia. She is asymptomatic from the anemia. I will observe for now.  She does not require transfusion now. I will continue the chemotherapy at current dose without dosage adjustment.  If the anemia gets progressive worse in the future, I might have to delay her treatment or adjust the chemotherapy dose. If her hemoglobin came back less than 8 g and of the week, we will go ahead and give her a blood transfusion #3 leukopenia This is due to her disease. We will observe carefully. I've asked her to cancel her dental appointments and appointment at the vascular  clinic. #4 thrombocytopenia This is due to her disease. This is likely due to recent treatment. The patient denies recent history of bleeding such as epistaxis, hematuria or hematochezia. She is asymptomatic from the low platelet count. I will observe for now.  she does not require transfusion now. I will continue the chemotherapy at current dose without dosage adjustment.  If the thrombocytopenia gets progressive worse in the future, I might have to delay her treatment or adjust the chemotherapy dose. #5 acute renal failure This has resolved with aggressive fluid rehydration. Her creatinine has not returned to all the way back to normal. I recommend she continue aggressive oral fluid intake We will monitor carefully. #6 recent hyperkalemia This has resolved. She will continue to hold her potassium supplements  Orders Placed This Encounter  Procedures  . Comprehensive metabolic panel    Standing Status: Future     Number of Occurrences:      Standing Expiration Date: 04/23/2014  . CBC & Diff and Retic    Standing Status: Future     Number of Occurrences:      Standing Expiration Date: 04/23/2014  . Hold Tube, Blood Bank    Standing Status: Future     Number of Occurrences:      Standing Expiration Date: 04/23/2014   All questions were answered. The patient knows to call the clinic with any problems, questions or concerns. No barriers to learning was detected.   [facility], Patrick Salemi, MD 04/23/2013 4:07 PM

## 2013-04-23 NOTE — Telephone Encounter (Signed)
Gave pt appt for lab and MD on 12/5

## 2013-04-24 ENCOUNTER — Ambulatory Visit: Payer: Medicare Other | Admitting: Hematology and Oncology

## 2013-04-24 ENCOUNTER — Other Ambulatory Visit: Payer: Medicare Other | Admitting: Lab

## 2013-04-27 ENCOUNTER — Encounter: Payer: Self-pay | Admitting: Hematology and Oncology

## 2013-04-27 ENCOUNTER — Other Ambulatory Visit (HOSPITAL_BASED_OUTPATIENT_CLINIC_OR_DEPARTMENT_OTHER): Payer: Medicare Other

## 2013-04-27 ENCOUNTER — Other Ambulatory Visit: Payer: Self-pay | Admitting: Hematology and Oncology

## 2013-04-27 ENCOUNTER — Telehealth: Payer: Self-pay | Admitting: Hematology and Oncology

## 2013-04-27 ENCOUNTER — Ambulatory Visit (HOSPITAL_BASED_OUTPATIENT_CLINIC_OR_DEPARTMENT_OTHER): Payer: Medicare Other | Admitting: Hematology and Oncology

## 2013-04-27 VITALS — BP 131/56 | HR 60 | Temp 98.2°F | Resp 18 | Ht 62.0 in | Wt 227.8 lb

## 2013-04-27 DIAGNOSIS — D649 Anemia, unspecified: Secondary | ICD-10-CM

## 2013-04-27 DIAGNOSIS — C91Z0 Other lymphoid leukemia not having achieved remission: Secondary | ICD-10-CM

## 2013-04-27 DIAGNOSIS — D696 Thrombocytopenia, unspecified: Secondary | ICD-10-CM

## 2013-04-27 DIAGNOSIS — D72819 Decreased white blood cell count, unspecified: Secondary | ICD-10-CM

## 2013-04-27 DIAGNOSIS — R7989 Other specified abnormal findings of blood chemistry: Secondary | ICD-10-CM

## 2013-04-27 DIAGNOSIS — N19 Unspecified kidney failure: Secondary | ICD-10-CM

## 2013-04-27 DIAGNOSIS — E875 Hyperkalemia: Secondary | ICD-10-CM

## 2013-04-27 DIAGNOSIS — D61818 Other pancytopenia: Secondary | ICD-10-CM

## 2013-04-27 LAB — CBC & DIFF AND RETIC
EOS%: 0.3 % (ref 0.0–7.0)
Eosinophils Absolute: 0 10*3/uL (ref 0.0–0.5)
HGB: 8.6 g/dL — ABNORMAL LOW (ref 11.6–15.9)
Immature Retic Fract: 6.5 % (ref 1.60–10.00)
LYMPH%: 23.3 % (ref 14.0–49.7)
MCH: 29.1 pg (ref 25.1–34.0)
MCHC: 31.6 g/dL (ref 31.5–36.0)
MCV: 91.9 fL (ref 79.5–101.0)
MONO%: 1.1 % (ref 0.0–14.0)
NEUT#: 2.7 10*3/uL (ref 1.5–6.5)
RBC: 2.96 10*6/uL — ABNORMAL LOW (ref 3.70–5.45)
RDW: 17.7 % — ABNORMAL HIGH (ref 11.2–14.5)
Retic %: 0.5 % — ABNORMAL LOW (ref 0.70–2.10)
Retic Ct Abs: 14.8 10*3/uL — ABNORMAL LOW (ref 33.70–90.70)
lymph#: 0.8 10*3/uL — ABNORMAL LOW (ref 0.9–3.3)

## 2013-04-27 LAB — COMPREHENSIVE METABOLIC PANEL (CC13)
AST: 57 U/L — ABNORMAL HIGH (ref 5–34)
Albumin: 4.2 g/dL (ref 3.5–5.0)
Anion Gap: 13 mEq/L — ABNORMAL HIGH (ref 3–11)
BUN: 21.9 mg/dL (ref 7.0–26.0)
CO2: 26 mEq/L (ref 22–29)
Calcium: 9.4 mg/dL (ref 8.4–10.4)
Chloride: 105 mEq/L (ref 98–109)
Creatinine: 1.7 mg/dL — ABNORMAL HIGH (ref 0.6–1.1)
Glucose: 108 mg/dl (ref 70–140)
Potassium: 4.2 mEq/L (ref 3.5–5.1)
Total Protein: 6.9 g/dL (ref 6.4–8.3)

## 2013-04-27 NOTE — Telephone Encounter (Signed)
GAve pt appt for lab and MD  1 week 05/04/13

## 2013-04-27 NOTE — Progress Notes (Signed)
[facility] OFFICE PROGRESS NOTE  Patient Care Team: Rafael Bihari, Stacey as PCP - General (Unknown Physician Specialty) Artis Delay, Stacey as Consulting Physician (Hematology and Oncology)  DIAGNOSIS: LGL leukemia for further management  SUMMARY OF ONCOLOGIC HISTORY: This is a patient it was seen here approximately 4 months ago. She was found to have anemia and was placed on observation. 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 On 04/09/2013, she was started on prednisone 60 mg daily and methotrexate 7.5 mg once a week by mouth On 04/16/2013, she developed acute renal failure with hyperkalemia. Prednisone dose was reduced to 40 mg a day and methotrexate was placed on hold On 04/23/2013, acute renal failure was improving. The prednisone dose was reduced to 20 mg a day and would resume methotrexate at 7.5 mg once a week. INTERVAL HISTORY: Stacey Cooley 72 y.o. female returns for further followup. With reducing dose of prednisone she feel less hungry. She have more energy level. Denies signs and symptoms of anemia such as dizziness, shortness of breath or chest pain. She denies any recent fever, chills, night sweats or abnormal weight loss The patient denies any recent signs or symptoms of bleeding such as spontaneous epistaxis, hematuria or hematochezia. Holick swelling is improving.  I have reviewed the past medical history, past surgical history, social history and family history with the patient and they are unchanged from previous note.  ALLERGIES:  is allergic to latex; penicillins; and sulfonamide derivatives.  MEDICATIONS:  Current Outpatient Prescriptions  Medication Sig Dispense Refill  .  acetaminophen-codeine (TYLENOL #3) 300-30 MG per tablet Take 1 tablet by mouth 4 (four) times daily as needed for pain.       Marland Kitchen allopurinol (ZYLOPRIM) 300 MG tablet Take 300 mg by mouth every morning.       . Calcium Carbonate-Vitamin D (CALCIUM 600+D) 600-400 MG-UNIT per tablet Take 1 tablet by mouth daily.      . cholecalciferol (VITAMIN D) 1000 UNITS tablet Take 1,000 Units by mouth 2 (two) times daily.      . citalopram (CELEXA) 20 MG tablet Take 20 mg by mouth every morning.       . Cyanocobalamin (VITAMIN B-12 SL) Place 1 tablet under the tongue daily.      . cyclobenzaprine (FLEXERIL) 5 MG tablet Take 1 tablet by mouth 2 (two) times daily.      . fluticasone (FLONASE) 50 MCG/ACT nasal spray Place 3 sprays into the nose daily.      Marland Kitchen levothyroxine (SYNTHROID, LEVOTHROID) 75 MCG tablet Take 75 mcg by mouth daily before breakfast.       . methotrexate (RHEUMATREX) 2.5 MG tablet Take 3 tablets (7.5 mg total) by mouth once a week. Caution:Chemotherapy. Protect from light.  12 tablet  3  . metoprolol tartrate (LOPRESSOR) 25 MG tablet Take 25 mg by mouth 2 (two) times daily.        . Multiple Vitamin (MULTIVITAMIN WITH MINERALS) TABS Take 1 tablet by mouth daily. Centrum      . ondansetron (ZOFRAN) 8 MG tablet Take 1 tablet (8 mg total) by mouth every 8 (eight) hours as needed for nausea.  30 tablet  3  . oxybutynin (DITROPAN) 5 MG tablet Take 5 mg by mouth  Daily.      . predniSONE (DELTASONE) 20 MG tablet Take 20 mg by mouth daily with breakfast.       . benazepril (LOTENSIN) 20 MG tablet Take 20 mg by mouth every evening.       . furosemide (LASIX) 40 MG tablet Take 40 mg by mouth daily as needed for fluid.        No current facility-administered medications for this visit.    REVIEW OF SYSTEMS:   Constitutional: Denies fevers, chills or abnormal weight loss Eyes: Denies blurriness of vision Ears, nose, mouth, throat, and face: Denies mucositis or sore throat Respiratory: Denies cough,  dyspnea or wheezes Cardiovascular: Denies palpitation, chest discomfort or lower extremity swelling Gastrointestinal:  Denies nausea, heartburn or change in bowel habits Skin: Denies abnormal skin rashes Lymphatics: Denies new lymphadenopathy or easy bruising Neurological:Denies numbness, tingling or new weaknesses Behavioral/Psych: Mood is stable, no new changes  All other systems were reviewed with the patient and are negative.  PHYSICAL EXAMINATION: ECOG PERFORMANCE STATUS: 0 - Asymptomatic  Filed Vitals:   04/27/13 1022  BP: 131/56  Pulse: 60  Temp: 98.2 F (36.8 C)  Resp: 18   Filed Weights   04/27/13 1022  Weight: 227 lb 12.8 oz (103.329 kg)    GENERAL:alert, no distress and comfortable. She is morbidly obese SKIN: skin color, texture, turgor are normal, no rashes or significant lesions EYES: normal, Conjunctiva are pink and non-injected, sclera clear OROPHARYNX:no exudate, no erythema and lips, buccal mucosa, and tongue normal  NECK: supple, thyroid normal size, non-tender, without nodularity LYMPH:  no palpable lymphadenopathy in the cervical, axillary or inguinal LUNGS: clear to auscultation and percussion with normal breathing effort HEART: regular rate & rhythm and no murmurs with Cooley lower extremity swelling, nonpitting ABDOMEN:abdomen soft, non-tender and normal bowel sounds Musculoskeletal:no cyanosis of digits and no clubbing  NEURO: alert & oriented x 3 with fluent speech, no focal motor/sensory deficits  LABORATORY DATA:  I have reviewed the data as listed    Component Value Date/Time   NA 141 04/23/2013 1505   NA 144 08/02/2012 1500   K 3.7 04/23/2013 1505   K 4.0 08/02/2012 1500   CL 105 10/25/2012 1135   CL 103 08/02/2012 1500   CO2 24 04/23/2013 1505   CO2 28 08/02/2012 1500   GLUCOSE 86 04/23/2013 1505   GLUCOSE 85 10/25/2012 1135   GLUCOSE 76 08/02/2012 1500   BUN 23.6 04/23/2013 1505   BUN 15 08/02/2012 1500   CREATININE 1.4* 04/23/2013 1505    CREATININE 0.94 08/02/2012 1500   CREATININE 1.22* 09/08/2011 1120   CALCIUM 9.7 04/23/2013 1505   CALCIUM 10.3 08/02/2012 1500   PROT 6.5 04/23/2013 1505   PROT 7.9 08/02/2012 1500   ALBUMIN 4.1 04/23/2013 1505   ALBUMIN 4.1 08/02/2012 1500   AST 25 04/23/2013 1505   AST 29 08/02/2012 1500   ALT 23 04/23/2013 1505   ALT 38* 08/02/2012 1500   ALKPHOS 64 04/23/2013 1505   ALKPHOS 151* 08/02/2012 1500   BILITOT 0.64 04/23/2013 1505   BILITOT 0.3 08/02/2012 1500   GFRNONAA 61* 08/02/2012 1500   GFRAA 70* 08/02/2012 1500    No results found for this basename: SPEP,  UPEP,   kappa and lambda light chains    Lab Results  Component Value Date   WBC 3.6* 04/27/2013   NEUTROABS 2.7 04/27/2013   HGB 8.6* 04/27/2013   HCT 27.2* 04/27/2013   MCV 91.9 04/27/2013   PLT 105*  04/27/2013      Chemistry      Component Value Date/Time   NA 141 04/23/2013 1505   NA 144 08/02/2012 1500   K 3.7 04/23/2013 1505   K 4.0 08/02/2012 1500   CL 105 10/25/2012 1135   CL 103 08/02/2012 1500   CO2 24 04/23/2013 1505   CO2 28 08/02/2012 1500   BUN 23.6 04/23/2013 1505   BUN 15 08/02/2012 1500   CREATININE 1.4* 04/23/2013 1505   CREATININE 0.94 08/02/2012 1500   CREATININE 1.22* 09/08/2011 1120      Component Value Date/Time   CALCIUM 9.7 04/23/2013 1505   CALCIUM 10.3 08/02/2012 1500   ALKPHOS 64 04/23/2013 1505   ALKPHOS 151* 08/02/2012 1500   AST 25 04/23/2013 1505   AST 29 08/02/2012 1500   ALT 23 04/23/2013 1505   ALT 38* 08/02/2012 1500   BILITOT 0.64 04/23/2013 1505   BILITOT 0.3 08/02/2012 1500      ASSESSMENT & PLAN:  #1 LGL leukemia She is responding to methotrexate. She is experiencing multiple side effects.I m tapering prednisone to 20 mg alternate with 10 mg. I'm seeing signs of thrombocytopenia. I want to reduce methotrexate 25 mg once a week start next week. #2 severe anemia This is likely due to recent treatment and her underlying disease The patient denies recent history of bleeding such as epistaxis, hematuria  or hematochezia. She is asymptomatic from the anemia. I will observe for now.  She does not require transfusion now. I will continue the chemotherapy with mild dosage adjustment.  If the anemia gets progressive worse in the future, I might have to Cooley her treatment or further adjust the chemotherapy dose. #3 mild thrombocytopenia This is likely due to recent treatment. The patient denies recent history of bleeding such as epistaxis, hematuria or hematochezia. She is asymptomatic from the low platelet count. I will observe for now.  she does not require transfusion now. I will continue the chemotherapy with mild dosage adjustment.  If the thrombocytopenia gets progressive worse in the future, I might have to Cooley her treatment. #4 severe leukopenia This has improved.This is likely due to recent treatment. The patient denies recent history of fevers, cough, chills, diarrhea or dysuria. She is asymptomatic from the leukopenia. I will observe for now.  #5 recent renal failure This is improving. I will continue to hold her ACE inhibitor. I will continue to hold Lasix and increase her oral intake #6 recent hyperkalemia I will continue to hold potassium supplements.  Orders Placed This Encounter  Procedures  . Hold Tube, Blood Bank    Standing Status: Future     Number of Occurrences:      Standing Expiration Date: 04/27/2014   All questions were answered. The patient knows to call the clinic with any problems, questions or concerns. No barriers to learning was detected.    Pamala Hayman, Stacey 04/27/2013 10:39 AM

## 2013-05-02 ENCOUNTER — Ambulatory Visit: Payer: Medicare Other | Admitting: Hematology and Oncology

## 2013-05-04 ENCOUNTER — Telehealth: Payer: Self-pay | Admitting: Hematology and Oncology

## 2013-05-04 ENCOUNTER — Encounter (INDEPENDENT_AMBULATORY_CARE_PROVIDER_SITE_OTHER): Payer: Self-pay

## 2013-05-04 ENCOUNTER — Ambulatory Visit (HOSPITAL_COMMUNITY)
Admission: RE | Admit: 2013-05-04 | Discharge: 2013-05-04 | Disposition: A | Discharge status: Home / Self Care | Payer: Medicare Other | Source: Ambulatory Visit | Attending: Hematology and Oncology | Admitting: Hematology and Oncology

## 2013-05-04 ENCOUNTER — Ambulatory Visit (HOSPITAL_BASED_OUTPATIENT_CLINIC_OR_DEPARTMENT_OTHER): Payer: Medicare Other | Admitting: Hematology and Oncology

## 2013-05-04 ENCOUNTER — Ambulatory Visit (HOSPITAL_BASED_OUTPATIENT_CLINIC_OR_DEPARTMENT_OTHER): Payer: Medicare Other

## 2013-05-04 ENCOUNTER — Ambulatory Visit: Payer: Medicare Other

## 2013-05-04 ENCOUNTER — Other Ambulatory Visit (HOSPITAL_BASED_OUTPATIENT_CLINIC_OR_DEPARTMENT_OTHER): Payer: Medicare Other

## 2013-05-04 VITALS — BP 113/61 | HR 65 | Temp 97.8°F | Resp 18

## 2013-05-04 VITALS — BP 119/62 | HR 59 | Temp 98.1°F | Resp 18 | Ht 62.0 in | Wt 226.2 lb

## 2013-05-04 DIAGNOSIS — R5381 Other malaise: Secondary | ICD-10-CM

## 2013-05-04 DIAGNOSIS — C91Z0 Other lymphoid leukemia not having achieved remission: Secondary | ICD-10-CM

## 2013-05-04 DIAGNOSIS — D649 Anemia, unspecified: Secondary | ICD-10-CM

## 2013-05-04 DIAGNOSIS — D696 Thrombocytopenia, unspecified: Secondary | ICD-10-CM

## 2013-05-04 DIAGNOSIS — R51 Headache: Secondary | ICD-10-CM

## 2013-05-04 DIAGNOSIS — M7989 Other specified soft tissue disorders: Secondary | ICD-10-CM

## 2013-05-04 DIAGNOSIS — D72819 Decreased white blood cell count, unspecified: Secondary | ICD-10-CM

## 2013-05-04 DIAGNOSIS — N19 Unspecified kidney failure: Secondary | ICD-10-CM

## 2013-05-04 LAB — COMPREHENSIVE METABOLIC PANEL (CC13)
ALT: 25 U/L (ref 0–55)
AST: 24 U/L (ref 5–34)
Alkaline Phosphatase: 64 U/L (ref 40–150)
BUN: 30.9 mg/dL — ABNORMAL HIGH (ref 7.0–26.0)
Calcium: 9.6 mg/dL (ref 8.4–10.4)
Sodium: 141 mEq/L (ref 136–145)
Total Bilirubin: 0.53 mg/dL (ref 0.20–1.20)
Total Protein: 6.4 g/dL (ref 6.4–8.3)

## 2013-05-04 LAB — CBC WITH DIFFERENTIAL/PLATELET
BASO%: 0.4 % (ref 0.0–2.0)
EOS%: 0.8 % (ref 0.0–7.0)
HCT: 24.6 % — ABNORMAL LOW (ref 34.8–46.6)
LYMPH%: 28.8 % (ref 14.0–49.7)
MCH: 30.4 pg (ref 25.1–34.0)
MCHC: 32.2 g/dL (ref 31.5–36.0)
MCV: 94.5 fL (ref 79.5–101.0)
MONO#: 0.1 10*3/uL (ref 0.1–0.9)
MONO%: 5 % (ref 0.0–14.0)
Platelets: 119 10*3/uL — ABNORMAL LOW (ref 145–400)
RBC: 2.6 10*6/uL — ABNORMAL LOW (ref 3.70–5.45)
RDW: 18.8 % — ABNORMAL HIGH (ref 11.2–14.5)
WBC: 2.6 10*3/uL — ABNORMAL LOW (ref 3.9–10.3)
lymph#: 0.7 10*3/uL — ABNORMAL LOW (ref 0.9–3.3)

## 2013-05-04 LAB — HOLD TUBE, BLOOD BANK

## 2013-05-04 LAB — PREPARE RBC (CROSSMATCH)

## 2013-05-04 LAB — ABO/RH: ABO/RH(D): O NEG

## 2013-05-04 MED ORDER — SODIUM CHLORIDE 0.9 % IV SOLN
250.0000 mL | Freq: Once | INTRAVENOUS | Status: AC
Start: 2013-05-04 — End: 2013-05-04
  Administered 2013-05-04: 250 mL via INTRAVENOUS

## 2013-05-04 MED ORDER — FUROSEMIDE 10 MG/ML IJ SOLN
20.0000 mg | Freq: Once | INTRAMUSCULAR | Status: AC
  Administered 2013-05-04: 20 mg via INTRAVENOUS

## 2013-05-04 NOTE — Progress Notes (Signed)
[facility] OFFICE PROGRESS NOTE  Patient Care Team: Rafael Bihari, MD as PCP - General (Unknown Physician Specialty) Artis Delay, MD as Consulting Physician (Hematology and Oncology)  DIAGNOSIS: LGL leukemia  SUMMARY OF ONCOLOGIC HISTORY: This is a patient it was seen here approximately 4 months ago. She was found to have anemia and was placed on observation. The cause of the anemia was thought to be related to chronic kidney disease. The patient also has history of Roux-en-Y gastric bypass and it was thought to be the contributing factor. 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 On 04/09/2013, she was started on prednisone 60 mg daily and methotrexate 7.5 mg once a week by mouth On 04/16/2013, she developed acute renal failure with hyperkalemia. Prednisone dose was reduced to 40 mg a day and methotrexate was placed on hold On 04/23/2013, acute renal failure was improving. The prednisone dose was reduced to 20 mg a day and would resume methotrexate at 7.5 mg once a week. On 04/27/2013, I reduced methotrexate to 5 mg once a week and prednisone dose was reduced to 10 mg alternate with 20 mg On 05/04/2013, I order one unit of blood transfusion. I The prednisone dose the same but reduced methotrexate to 2.5 mg once a week INTERVAL HISTORY: Stacey Cooley 72 y.o. female returns for further followup. She complained of intermittent headaches. She said her persistent fatigue. She denies any recent fever, chills, night sweats or abnormal weight loss The patient denies any recent signs or symptoms of bleeding such as spontaneous epistaxis, hematuria or hematochezia. Her blood pressure medication was put on hold due to recent renal failure. She still had intermittent leg swelling.  I have reviewed the past medical history, past surgical history, social history and family history with the patient and they  are unchanged from previous note.  ALLERGIES:  is allergic to latex; penicillins; and sulfonamide derivatives.  MEDICATIONS:  Current Outpatient Prescriptions  Medication Sig Dispense Refill  . acetaminophen-codeine (TYLENOL #3) 300-30 MG per tablet Take 1 tablet by mouth 4 (four) times daily as needed for pain.       Marland Kitchen allopurinol (ZYLOPRIM) 300 MG tablet Take 300 mg by mouth every morning.       . Calcium Carbonate-Vitamin D (CALCIUM 600+D) 600-400 MG-UNIT per tablet Take 1 tablet by mouth daily.      . cholecalciferol (VITAMIN D) 1000 UNITS tablet Take 1,000 Units by mouth 2 (two) times daily.      . citalopram (CELEXA) 20 MG tablet Take 20 mg by mouth every morning.       . Cyanocobalamin (VITAMIN B-12 SL) Place 1 tablet under the tongue daily.      . cyclobenzaprine (FLEXERIL) 5 MG tablet Take 1 tablet by mouth 2 (two) times daily.      . fluticasone (FLONASE) 50 MCG/ACT nasal spray Place 3 sprays into the nose daily.      . furosemide (LASIX) 40 MG tablet Take 40 mg by mouth daily as needed for fluid.       Marland Kitchen levothyroxine (SYNTHROID, LEVOTHROID) 75 MCG tablet Take 75 mcg by mouth daily before breakfast.       . methotrexate (RHEUMATREX) 2.5 MG tablet Take 5 mg by mouth once a week. Caution:Chemotherapy. Protect from light.      . metoprolol tartrate (LOPRESSOR) 25 MG tablet Take 25 mg by mouth 2 (two) times daily.        Marland Kitchen  Multiple Vitamin (MULTIVITAMIN WITH MINERALS) TABS Take 1 tablet by mouth daily. Centrum      . ondansetron (ZOFRAN) 8 MG tablet Take 1 tablet (8 mg total) by mouth every 8 (eight) hours as needed for nausea.  30 tablet  3  . oxybutynin (DITROPAN) 5 MG tablet Take 5 mg by mouth Daily.      . predniSONE (DELTASONE) 20 MG tablet Take 20 mg by mouth daily with breakfast. Tapering dose per Dr. Bertis Ruddy.       No current facility-administered medications for this visit.    REVIEW OF SYSTEMS:   Constitutional: Denies fevers, chills or abnormal weight loss Eyes: Denies  blurriness of vision Ears, nose, mouth, throat, and face: Denies mucositis or sore throat Respiratory: Denies cough, dyspnea or wheezes Cardiovascular: Denies palpitation, chest discomfort or lower extremity swelling Gastrointestinal:  Denies nausea, heartburn or change in bowel habits Skin: Denies abnormal skin rashes Lymphatics: Denies new lymphadenopathy or easy bruising Neurological:Denies numbness, tingling or new weaknesses Behavioral/Psych: Mood is stable, no new changes  All other systems were reviewed with the patient and are negative.  PHYSICAL EXAMINATION: ECOG PERFORMANCE STATUS: 1 - Symptomatic but completely ambulatory  Filed Vitals:   05/04/13 0835  BP: 119/62  Pulse: 59  Temp: 98.1 F (36.7 C)  Resp: 18   Filed Weights   05/04/13 0835  Weight: 226 lb 3.2 oz (102.604 kg)    GENERAL:alert, no distress and comfortable she looked pale and morbidly obese SKIN: skin color, texture, turgor are normal, no rashes or significant lesions EYES: normal, Conjunctiva are pink and non-injected, sclera clear OROPHARYNX:no exudate, no erythema and lips, buccal mucosa, and tongue normal  NECK: supple, thyroid normal size, non-tender, without nodularity LYMPH:  no palpable lymphadenopathy in the cervical, axillary or inguinal LUNGS: clear to auscultation and percussion with normal breathing effort HEART: regular rate & rhythm and no murmurs with chronic bilateral lower extremity edema ABDOMEN:abdomen soft, non-tender and normal bowel sounds Musculoskeletal:no cyanosis of digits and no clubbing  NEURO: alert & oriented x 3 with fluent speech, no focal motor/sensory deficits  LABORATORY DATA:  I have reviewed the data as listed    Component Value Date/Time   NA 141 05/04/2013 0821   NA 144 08/02/2012 1500   K 4.1 05/04/2013 0821   K 4.0 08/02/2012 1500   CL 105 10/25/2012 1135   CL 103 08/02/2012 1500   CO2 26 05/04/2013 0821   CO2 28 08/02/2012 1500   GLUCOSE 93 05/04/2013  0821   GLUCOSE 85 10/25/2012 1135   GLUCOSE 76 08/02/2012 1500   BUN 30.9* 05/04/2013 0821   BUN 15 08/02/2012 1500   CREATININE 1.5* 05/04/2013 0821   CREATININE 0.94 08/02/2012 1500   CREATININE 1.22* 09/08/2011 1120   CALCIUM 9.6 05/04/2013 0821   CALCIUM 10.3 08/02/2012 1500   PROT 6.4 05/04/2013 0821   PROT 7.9 08/02/2012 1500   ALBUMIN 3.9 05/04/2013 0821   ALBUMIN 4.1 08/02/2012 1500   AST 24 05/04/2013 0821   AST 29 08/02/2012 1500   ALT 25 05/04/2013 0821   ALT 38* 08/02/2012 1500   ALKPHOS 64 05/04/2013 0821   ALKPHOS 151* 08/02/2012 1500   BILITOT 0.53 05/04/2013 0821   BILITOT 0.3 08/02/2012 1500   GFRNONAA 61* 08/02/2012 1500   GFRAA 70* 08/02/2012 1500    No results found for this basename: SPEP,  UPEP,   kappa and lambda light chains    Lab Results  Component Value Date   WBC 2.6*  05/04/2013   NEUTROABS 1.7 05/04/2013   HGB 7.9* 05/04/2013   HCT 24.6* 05/04/2013   MCV 94.5 05/04/2013   PLT 119* 05/04/2013      Chemistry      Component Value Date/Time   NA 141 05/04/2013 0821   NA 144 08/02/2012 1500   K 4.1 05/04/2013 0821   K 4.0 08/02/2012 1500   CL 105 10/25/2012 1135   CL 103 08/02/2012 1500   CO2 26 05/04/2013 0821   CO2 28 08/02/2012 1500   BUN 30.9* 05/04/2013 0821   BUN 15 08/02/2012 1500   CREATININE 1.5* 05/04/2013 0821   CREATININE 0.94 08/02/2012 1500   CREATININE 1.22* 09/08/2011 1120      Component Value Date/Time   CALCIUM 9.6 05/04/2013 0821   CALCIUM 10.3 08/02/2012 1500   ALKPHOS 64 05/04/2013 0821   ALKPHOS 151* 08/02/2012 1500   AST 24 05/04/2013 0821   AST 29 08/02/2012 1500   ALT 25 05/04/2013 0821   ALT 38* 08/02/2012 1500   BILITOT 0.53 05/04/2013 0821   BILITOT 0.3 08/02/2012 1500      ASSESSMENT & PLAN:  #1 LGL leukemia She is responding to methotrexate. She is experiencing multiple side effects. I am keeping prednisone to 20 mg alternate with 10 mg. I'm seeing signs of thrombocytopenia. I want to reduce methotrexate dose to 2.5 mg  once a week start next week. #2 severe anemia This is likely due to recent treatment and her underlying disease The patient denies recent history of bleeding such as epistaxis, hematuria or hematochezia. She is symptomatic from the anemia with headaches We discussed some of the risks, benefits, and alternatives of blood transfusions. The patient is symptomatic from anemia and the hemoglobin level is critically low.  Some of the side-effects to be expected including risks of transfusion reactions, chills, infection, syndrome of volume overload and risk of hospitalization from various reasons and the patient is willing to proceed and went ahead to sign consent today. #3 mild thrombocytopenia This is likely due to recent treatment. The patient denies recent history of bleeding such as epistaxis, hematuria or hematochezia. She is asymptomatic from the low platelet count. I will observe for now.  she does not require transfusion now. I will continue the chemotherapy with mild dosage adjustment.  If the thrombocytopenia gets progressive worse in the future, I might have to delay her treatment. #4 severe leukopenia This is stable.This is likely due to recent treatment. The patient denies recent history of fevers, cough, chills, diarrhea or dysuria. She is asymptomatic from the leukopenia. I will observe for now.  #5 recent renal failure This is improving. I will continue to hold her ACE inhibitor.  Orders Placed This Encounter  Procedures  . Hold Tube, Blood Bank    Standing Status: Standing     Number of Occurrences: 9     Standing Expiration Date: 05/04/2014   All questions were answered. The patient knows to call the clinic with any problems, questions or concerns. No barriers to learning was detected.    Jenni Thew, MD 05/04/2013 11:00 AM

## 2013-05-04 NOTE — Telephone Encounter (Signed)
appts made per 12/12 POF AVS and CAL given shh °

## 2013-05-04 NOTE — Patient Instructions (Signed)
Blood Transfusion Information WHAT IS A BLOOD TRANSFUSION? A transfusion is the replacement of blood or some of its parts. Blood is made up of multiple cells which provide different functions.  Red blood cells carry oxygen and are used for blood loss replacement.  White blood cells fight against infection.  Platelets control bleeding.  Plasma helps clot blood.  Other blood products are available for specialized needs, such as hemophilia or other clotting disorders. BEFORE THE TRANSFUSION  Who gives blood for transfusions?   You may be able to donate blood to be used at a later date on yourself (autologous donation).  Relatives can be asked to donate blood. This is generally not any safer than if you have received blood from a stranger. The same precautions are taken to ensure safety when a relative's blood is donated.  Healthy volunteers who are fully evaluated to make sure their blood is safe. This is blood bank blood. Transfusion therapy is the safest it has ever been in the practice of medicine. Before blood is taken from a donor, a complete history is taken to make sure that person has no history of diseases nor engages in risky social behavior (examples are intravenous drug use or sexual activity with multiple partners). The donor's travel history is screened to minimize risk of transmitting infections, such as malaria. The donated blood is tested for signs of infectious diseases, such as HIV and hepatitis. The blood is then tested to be sure it is compatible with you in order to minimize the chance of a transfusion reaction. If you or a relative donates blood, this is often done in anticipation of surgery and is not appropriate for emergency situations. It takes many days to process the donated blood. RISKS AND COMPLICATIONS Although transfusion therapy is very safe and saves many lives, the main dangers of transfusion include:   Getting an infectious disease.  Developing a  transfusion reaction. This is an allergic reaction to something in the blood you were given. Every precaution is taken to prevent this. The decision to have a blood transfusion has been considered carefully by your caregiver before blood is given. Blood is not given unless the benefits outweigh the risks. AFTER THE TRANSFUSION  Right after receiving a blood transfusion, you will usually feel much better and more energetic. This is especially true if your red blood cells have gotten low (anemic). The transfusion raises the level of the red blood cells which carry oxygen, and this usually causes an energy increase.  The nurse administering the transfusion will monitor you carefully for complications. HOME CARE INSTRUCTIONS  No special instructions are needed after a transfusion. You may find your energy is better. Speak with your caregiver about any limitations on activity for underlying diseases you may have. SEEK MEDICAL CARE IF:   Your condition is not improving after your transfusion.  You develop redness or irritation at the intravenous (IV) site. SEEK IMMEDIATE MEDICAL CARE IF:  Any of the following symptoms occur over the next 12 hours:  Shaking chills.  You have a temperature by mouth above 102 F (38.9 C), not controlled by medicine.  Chest, back, or muscle pain.  People around you feel you are not acting correctly or are confused.  Shortness of breath or difficulty breathing.  Dizziness and fainting.  You get a rash or develop hives.  You have a decrease in urine output.  Your urine turns a dark color or changes to pink, red, or brown. Any of the following   symptoms occur over the next 10 days:  You have a temperature by mouth above 102 F (38.9 C), not controlled by medicine.  Shortness of breath.  Weakness after normal activity.  The white part of the eye turns yellow (jaundice).  You have a decrease in the amount of urine or are urinating less often.  Your  urine turns a dark color or changes to pink, red, or brown. Document Released: 05/07/2000 Document Revised: 08/02/2011 Document Reviewed: 12/25/2007 Exit[employer] Patient Information 2014 [employer].  

## 2013-05-06 LAB — TYPE AND SCREEN
ABO/RH(D): O NEG
Antibody Screen: NEGATIVE

## 2013-05-11 ENCOUNTER — Telehealth: Payer: Self-pay | Admitting: Hematology and Oncology

## 2013-05-11 ENCOUNTER — Ambulatory Visit (HOSPITAL_BASED_OUTPATIENT_CLINIC_OR_DEPARTMENT_OTHER): Payer: Medicare Other | Admitting: Hematology and Oncology

## 2013-05-11 ENCOUNTER — Encounter: Payer: Self-pay | Admitting: Hematology and Oncology

## 2013-05-11 ENCOUNTER — Other Ambulatory Visit (HOSPITAL_BASED_OUTPATIENT_CLINIC_OR_DEPARTMENT_OTHER): Payer: Medicare Other

## 2013-05-11 VITALS — BP 129/89 | HR 59 | Temp 98.1°F | Resp 18 | Ht 62.0 in | Wt 229.2 lb

## 2013-05-11 DIAGNOSIS — C91Z0 Other lymphoid leukemia not having achieved remission: Secondary | ICD-10-CM

## 2013-05-11 DIAGNOSIS — D649 Anemia, unspecified: Secondary | ICD-10-CM

## 2013-05-11 DIAGNOSIS — D696 Thrombocytopenia, unspecified: Secondary | ICD-10-CM

## 2013-05-11 DIAGNOSIS — D72819 Decreased white blood cell count, unspecified: Secondary | ICD-10-CM

## 2013-05-11 DIAGNOSIS — N179 Acute kidney failure, unspecified: Secondary | ICD-10-CM

## 2013-05-11 HISTORY — DX: Acute kidney failure, unspecified: N17.9

## 2013-05-11 LAB — COMPREHENSIVE METABOLIC PANEL (CC13)
ALT: 22 U/L (ref 0–55)
AST: 32 U/L (ref 5–34)
Albumin: 4.2 g/dL (ref 3.5–5.0)
Alkaline Phosphatase: 82 U/L (ref 40–150)
BUN: 24.3 mg/dL (ref 7.0–26.0)
Creatinine: 1.8 mg/dL — ABNORMAL HIGH (ref 0.6–1.1)
Glucose: 96 mg/dl (ref 70–140)
Total Bilirubin: 0.61 mg/dL (ref 0.20–1.20)

## 2013-05-11 LAB — CBC WITH DIFFERENTIAL/PLATELET
BASO%: 0.5 % (ref 0.0–2.0)
Basophils Absolute: 0 10*3/uL (ref 0.0–0.1)
EOS%: 0.8 % (ref 0.0–7.0)
HCT: 28.8 % — ABNORMAL LOW (ref 34.8–46.6)
HGB: 9.2 g/dL — ABNORMAL LOW (ref 11.6–15.9)
LYMPH%: 23.2 % (ref 14.0–49.7)
MCH: 29.8 pg (ref 25.1–34.0)
MCHC: 31.9 g/dL (ref 31.5–36.0)
MCV: 93.3 fL (ref 79.5–101.0)
MONO%: 3.9 % (ref 0.0–14.0)
NEUT%: 71.6 % (ref 38.4–76.8)
RDW: 20.3 % — ABNORMAL HIGH (ref 11.2–14.5)
lymph#: 0.5 10*3/uL — ABNORMAL LOW (ref 0.9–3.3)

## 2013-05-11 NOTE — Progress Notes (Signed)
[facility] OFFICE PROGRESS NOTE  Patient Care Team: Rafael Bihari, MD as PCP - General (Unknown Physician Specialty) Artis Delay, MD as Consulting Physician (Hematology and Oncology)  DIAGNOSIS: LGL leukemia  SUMMARY OF ONCOLOGIC HISTORY: This is a patient it was seen here approximately 4 months ago. She was found to have anemia and was placed on observation. The cause of the anemia was thought to be related to chronic kidney disease. The patient also has history of Roux-en-Y gastric bypass and it was thought to be the contributing factor. 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 On 04/09/2013, she was started on prednisone 60 mg daily and methotrexate 7.5 mg once a week by mouth On 04/16/2013, she developed acute renal failure with hyperkalemia. Prednisone dose was reduced to 40 mg a day and methotrexate was placed on hold On 04/23/2013, acute renal failure was improving. The prednisone dose was reduced to 20 mg a day and would resume methotrexate at 7.5 mg once a week. On 04/27/2013, I reduced methotrexate to 5 mg once a week and prednisone dose was reduced to 10 mg alternate with 20 mg On 05/04/2013, I order one unit of blood transfusion. I The prednisone dose the same but reduced methotrexate to 2.5 mg once a week On 05/11/2013, I started to reduce prednisone dose further to 10 mg daily but to keep methotrexate at 2.5 mg once a week INTERVAL HISTORY: Stacey Cooley 72 y.o. female returns for further followup. She complained of mild sinusitis with occasional headaches and nasal congestion with nonproductive cough. She denies any recent fever, chills, night sweats or abnormal weight loss With blood transfusion, she had improvement in energy level. The patient denies any recent signs or symptoms of bleeding such as spontaneous epistaxis, hematuria or hematochezia. She complained of mild  worsening swelling of her extremities since we discontinue her diuretics and blood pressure medicine I have reviewed the past medical history, past surgical history, social history and family history with the patient and they are unchanged from previous note.  ALLERGIES:  is allergic to latex; penicillins; and sulfonamide derivatives.  MEDICATIONS:  Current Outpatient Prescriptions  Medication Sig Dispense Refill  . acetaminophen-codeine (TYLENOL #3) 300-30 MG per tablet Take 1 tablet by mouth 4 (four) times daily as needed for pain.       Marland Kitchen allopurinol (ZYLOPRIM) 300 MG tablet Take 300 mg by mouth every morning.       . Calcium Carbonate-Vitamin D (CALCIUM 600+D) 600-400 MG-UNIT per tablet Take 1 tablet by mouth daily.      . cholecalciferol (VITAMIN D) 1000 UNITS tablet Take 1,000 Units by mouth 2 (two) times daily.      . citalopram (CELEXA) 20 MG tablet Take 20 mg by mouth every morning.       . Cyanocobalamin (VITAMIN B-12 SL) Place 1 tablet under the tongue daily.      . cyclobenzaprine (FLEXERIL) 5 MG tablet Take 1 tablet by mouth 2 (two) times daily.      . fluticasone (FLONASE) 50 MCG/ACT nasal spray Place 3 sprays into the nose daily.      . furosemide (LASIX) 40 MG tablet Take 40 mg by mouth daily as needed for fluid.       Marland Kitchen levothyroxine (SYNTHROID, LEVOTHROID) 75 MCG tablet Take 75 mcg by mouth daily before breakfast.       . methotrexate (RHEUMATREX) 2.5 MG tablet Take 2.5 mg by mouth once  a week. Caution:Chemotherapy. Protect from light.      . metoprolol tartrate (LOPRESSOR) 25 MG tablet Take 25 mg by mouth 2 (two) times daily.        . Multiple Vitamin (MULTIVITAMIN WITH MINERALS) TABS Take 1 tablet by mouth daily. Centrum      . ondansetron (ZOFRAN) 8 MG tablet Take 1 tablet (8 mg total) by mouth every 8 (eight) hours as needed for nausea.  30 tablet  3  . oxybutynin (DITROPAN) 5 MG tablet Take 5 mg by mouth Daily.      . predniSONE (DELTASONE) 20 MG tablet Take 20 mg by  mouth daily with breakfast. Alternate with 10 mg every other day per Dr. Bertis Ruddy.       No current facility-administered medications for this visit.    REVIEW OF SYSTEMS:   Constitutional: Denies fevers, chills or abnormal weight loss Eyes: Denies blurriness of vision Gastrointestinal:  Denies nausea, heartburn or change in bowel habits Skin: Denies abnormal skin rashes Lymphatics: Denies new lymphadenopathy or easy bruising Neurological:Denies numbness, tingling or new weaknesses Behavioral/Psych: Mood is stable, no new changes  All other systems were reviewed with the patient and are negative.  PHYSICAL EXAMINATION: ECOG PERFORMANCE STATUS: 1 - Symptomatic but completely ambulatory  Filed Vitals:   05/11/13 0857  BP: 129/89  Pulse: 59  Temp: 98.1 F (36.7 C)  Resp: 18   Filed Weights   05/11/13 0857  Weight: 229 lb 3.2 oz (103.964 kg)    GENERAL:alert, no distress and comfortable. Patient is morbidly obese SKIN: skin color, texture, turgor are normal, no rashes or significant lesions EYES: normal, Conjunctiva are pink and non-injected, sclera clear OROPHARYNX:no exudate, no erythema and lips, buccal mucosa, and tongue normal . Mild tenderness on palpation on her maxillary sinus and frontal sinus region NECK: supple, thyroid normal size, non-tender, without nodularity LYMPH:  no palpable lymphadenopathy in the cervical, axillary or inguinal LUNGS: clear to auscultation and percussion with normal breathing effort HEART: regular rate & rhythm and no murmurs and no lower extremity edema ABDOMEN:abdomen soft, non-tender and normal bowel sounds Musculoskeletal:no cyanosis of digits and no clubbing  NEURO: alert & oriented x 3 with fluent speech, no focal motor/sensory deficits  LABORATORY DATA:  I have reviewed the data as listed    Component Value Date/Time   NA 141 05/04/2013 0821   NA 144 08/02/2012 1500   K 4.1 05/04/2013 0821   K 4.0 08/02/2012 1500   CL 105 10/25/2012  1135   CL 103 08/02/2012 1500   CO2 26 05/04/2013 0821   CO2 28 08/02/2012 1500   GLUCOSE 93 05/04/2013 0821   GLUCOSE 85 10/25/2012 1135   GLUCOSE 76 08/02/2012 1500   BUN 30.9* 05/04/2013 0821   BUN 15 08/02/2012 1500   CREATININE 1.5* 05/04/2013 0821   CREATININE 0.94 08/02/2012 1500   CREATININE 1.22* 09/08/2011 1120   CALCIUM 9.6 05/04/2013 0821   CALCIUM 10.3 08/02/2012 1500   PROT 6.4 05/04/2013 0821   PROT 7.9 08/02/2012 1500   ALBUMIN 3.9 05/04/2013 0821   ALBUMIN 4.1 08/02/2012 1500   AST 24 05/04/2013 0821   AST 29 08/02/2012 1500   ALT 25 05/04/2013 0821   ALT 38* 08/02/2012 1500   ALKPHOS 64 05/04/2013 0821   ALKPHOS 151* 08/02/2012 1500   BILITOT 0.53 05/04/2013 0821   BILITOT 0.3 08/02/2012 1500   GFRNONAA 61* 08/02/2012 1500   GFRAA 70* 08/02/2012 1500    No results found for this basename:  SPEP,  UPEP,   kappa and lambda light chains    Lab Results  Component Value Date   WBC 2.2* 05/11/2013   NEUTROABS 1.6 05/11/2013   HGB 9.2* 05/11/2013   HCT 28.8* 05/11/2013   MCV 93.3 05/11/2013   PLT 206 05/11/2013      Chemistry      Component Value Date/Time   NA 141 05/04/2013 0821   NA 144 08/02/2012 1500   K 4.1 05/04/2013 0821   K 4.0 08/02/2012 1500   CL 105 10/25/2012 1135   CL 103 08/02/2012 1500   CO2 26 05/04/2013 0821   CO2 28 08/02/2012 1500   BUN 30.9* 05/04/2013 0821   BUN 15 08/02/2012 1500   CREATININE 1.5* 05/04/2013 0821   CREATININE 0.94 08/02/2012 1500   CREATININE 1.22* 09/08/2011 1120      Component Value Date/Time   CALCIUM 9.6 05/04/2013 0821   CALCIUM 10.3 08/02/2012 1500   ALKPHOS 64 05/04/2013 0821   ALKPHOS 151* 08/02/2012 1500   AST 24 05/04/2013 0821   AST 29 08/02/2012 1500   ALT 25 05/04/2013 0821   ALT 38* 08/02/2012 1500   BILITOT 0.53 05/04/2013 0821   BILITOT 0.3 08/02/2012 1500     ASSESSMENT & PLAN:  #1 LGL leukemia She is responding well to methotrexate. Thrombocytopenia had resolved. She is experiencing multiple side effects.   I recommend we keep methotrexate 2.5 mg and continue prednisone taper to 10 mg daily. #2 severe anemia This is likely due to recent treatment and her underlying disease The patient denies recent history of bleeding such as epistaxis, hematuria or hematochezia. She received blood transfusion recently. Continue monitoring for now #3 mild thrombocytopenia, resolved This is positive sign she may be responding to treatment #4 severe leukopenia This is stable.This is likely due to recent treatment. The patient denies recent history of fevers, cough, chills, diarrhea or dysuria. She is asymptomatic from the leukopenia. I will observe for now.  #5 recent renal failure This is not improving. I am wondering whether she may have mild tumor lysis syndrome at the start of her treatment and develop acute kidney injury that did not improve. I will continue to hold her ACE inhibitor and Lasix. I recommend nephrology consultation and she agreed #6 upper respiratory tract congestion I recommend over-the-counter Mucinex and Benadryl. All questions were answered. The patient knows to call the clinic with any problems, questions or concerns. No barriers to learning was detected.   Melady Chow, MD 05/11/2013 9:12 AM

## 2013-05-11 NOTE — Telephone Encounter (Signed)
appts made per 12/19 POF AVS and CAL given shh °

## 2013-05-14 ENCOUNTER — Telehealth: Payer: Self-pay | Admitting: Hematology and Oncology

## 2013-05-14 NOTE — Telephone Encounter (Signed)
RECORDS FAXED TO [facility] FOR REFERRAL [phone]

## 2013-05-15 ENCOUNTER — Telehealth: Payer: Self-pay | Admitting: *Deleted

## 2013-05-15 ENCOUNTER — Telehealth: Payer: Self-pay | Admitting: Hematology and Oncology

## 2013-05-15 NOTE — Telephone Encounter (Signed)
Husband called states has not heard about referral to Kidney specialist yet.   Informed husband that referral has been made and [facility] will be contacting pt for appt.  Informed they reviewed pt's chart and have assessed the referral as not urgent,  So it may be a few weeks.  Gave him the phone number to call [facility] if needed.  He verbalized understanding.

## 2013-05-15 NOTE — Telephone Encounter (Signed)
Authorized referral to nephrology per Fransisca Kaufmann Noted Tiffany has faxed info to Dr for referral shh

## 2013-05-15 NOTE — Telephone Encounter (Signed)
Fax recd from [facility] stating pts med recs reviewed by a DR and she is rated a 3 in terms of urgency of her referral appt to nephrology.  [facility]

## 2013-05-25 ENCOUNTER — Other Ambulatory Visit (HOSPITAL_BASED_OUTPATIENT_CLINIC_OR_DEPARTMENT_OTHER): Payer: Medicare Other

## 2013-05-25 ENCOUNTER — Telehealth: Payer: Self-pay | Admitting: *Deleted

## 2013-05-25 ENCOUNTER — Ambulatory Visit (HOSPITAL_BASED_OUTPATIENT_CLINIC_OR_DEPARTMENT_OTHER): Payer: Medicare Other | Admitting: Hematology and Oncology

## 2013-05-25 ENCOUNTER — Other Ambulatory Visit: Payer: Medicare Other

## 2013-05-25 ENCOUNTER — Ambulatory Visit: Payer: Medicare Other | Admitting: Hematology and Oncology

## 2013-05-25 ENCOUNTER — Telehealth: Payer: Self-pay | Admitting: Hematology and Oncology

## 2013-05-25 VITALS — BP 130/62 | HR 57 | Temp 98.3°F | Resp 20 | Ht 62.0 in | Wt 224.9 lb

## 2013-05-25 DIAGNOSIS — D649 Anemia, unspecified: Secondary | ICD-10-CM

## 2013-05-25 DIAGNOSIS — D72819 Decreased white blood cell count, unspecified: Secondary | ICD-10-CM

## 2013-05-25 DIAGNOSIS — N19 Unspecified kidney failure: Secondary | ICD-10-CM

## 2013-05-25 DIAGNOSIS — C91Z0 Other lymphoid leukemia not having achieved remission: Secondary | ICD-10-CM

## 2013-05-25 DIAGNOSIS — I10 Essential (primary) hypertension: Secondary | ICD-10-CM

## 2013-05-25 LAB — COMPREHENSIVE METABOLIC PANEL (CC13)
ALT: 26 U/L (ref 0–55)
ANION GAP: 11 meq/L (ref 3–11)
AST: 30 U/L (ref 5–34)
Albumin: 4.1 g/dL (ref 3.5–5.0)
Alkaline Phosphatase: 72 U/L (ref 40–150)
BUN: 20.8 mg/dL (ref 7.0–26.0)
CALCIUM: 9.8 mg/dL (ref 8.4–10.4)
CO2: 27 mEq/L (ref 22–29)
Chloride: 104 mEq/L (ref 98–109)
Creatinine: 1.2 mg/dL — ABNORMAL HIGH (ref 0.6–1.1)
Glucose: 109 mg/dl (ref 70–140)
POTASSIUM: 4.5 meq/L (ref 3.5–5.1)
Sodium: 142 mEq/L (ref 136–145)
Total Bilirubin: 0.73 mg/dL (ref 0.20–1.20)
Total Protein: 6.8 g/dL (ref 6.4–8.3)

## 2013-05-25 LAB — CBC WITH DIFFERENTIAL/PLATELET
BASO%: 0.3 % (ref 0.0–2.0)
Basophils Absolute: 0 10*3/uL (ref 0.0–0.1)
EOS%: 0.6 % (ref 0.0–7.0)
Eosinophils Absolute: 0 10*3/uL (ref 0.0–0.5)
HCT: 31.4 % — ABNORMAL LOW (ref 34.8–46.6)
HGB: 9.6 g/dL — ABNORMAL LOW (ref 11.6–15.9)
LYMPH#: 0.7 10*3/uL — AB (ref 0.9–3.3)
LYMPH%: 19.3 % (ref 14.0–49.7)
MCH: 28.9 pg (ref 25.1–34.0)
MCHC: 30.6 g/dL — ABNORMAL LOW (ref 31.5–36.0)
MCV: 94.6 fL (ref 79.5–101.0)
MONO#: 0.1 10*3/uL (ref 0.1–0.9)
MONO%: 2.8 % (ref 0.0–14.0)
NEUT#: 2.8 10*3/uL (ref 1.5–6.5)
NEUT%: 77 % — ABNORMAL HIGH (ref 38.4–76.8)
Platelets: 144 10*3/uL — ABNORMAL LOW (ref 145–400)
RBC: 3.32 10*6/uL — ABNORMAL LOW (ref 3.70–5.45)
RDW: 18.7 % — AB (ref 11.2–14.5)
WBC: 3.6 10*3/uL — ABNORMAL LOW (ref 3.9–10.3)
nRBC: 0 % (ref 0–0)

## 2013-05-25 LAB — HOLD TUBE, BLOOD BANK

## 2013-05-25 MED ORDER — PREDNISONE 5 MG PO TABS: 5.0000 mg | ORAL_TABLET | Freq: Every day | ORAL | Status: DC

## 2013-05-25 NOTE — Telephone Encounter (Signed)
Pt asks if ok for her to have Dentist appt for her regular cleaning yet?

## 2013-05-25 NOTE — Telephone Encounter (Signed)
Informed pt ok for cleaning and check up at Dentist.  Instructed her to notify us if any other procedures planned.  She verbalized understanding.

## 2013-05-25 NOTE — Telephone Encounter (Signed)
Yes, OK 

## 2013-05-25 NOTE — Progress Notes (Signed)
[place] OFFICE PROGRESS NOTE  Patient Care Team: Madelyn Brunner, MD as PCP - General (Unknown Physician Specialty) Heath Lark, MD as Consulting Physician (Hematology and Oncology)  DIAGNOSIS: LGL leukemia  SUMMARY OF ONCOLOGIC HISTORY: This is a patient it was seen here approximately 4 months ago. She was found to have anemia and was placed on observation. The cause of the anemia was thought to be related to chronic kidney disease. The patient also has history of Roux-en-Y gastric bypass and it was thought to be the contributing factor. 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 On 04/09/2013, she was started on prednisone 60 mg daily and methotrexate 7.5 mg once a week by mouth On 04/16/2013, she developed acute renal failure with hyperkalemia. Prednisone dose was reduced to 40 mg a day and methotrexate was placed on hold On 04/23/2013, acute renal failure was improving. The prednisone dose was reduced to 20 mg a day and would resume methotrexate at 7.5 mg once a week. On 04/27/2013, I reduced methotrexate to 5 mg once a week and prednisone dose was reduced to 10 mg alternate with 20 mg On 05/04/2013, I order one unit of blood transfusion. I The prednisone dose the same but reduced methotrexate to 2.5 mg once a week On 05/11/2013, I started to reduce prednisone dose further to 10 mg daily but to keep methotrexate at 2.5 mg once a week On 05/25/2013, I further reduce prednisone to 10 mg alternate with 7.5 mg and keep methotrexate at 2.5 mg once a week INTERVAL HISTORY: Stacey Cooley 73 y.o. female returns for further followup. She is doing well apart from mild bruises. Her previous upper respiratory tract infection has resolved. She denies any recent fever, chills, night sweats or abnormal weight loss The patient denies any recent signs or symptoms of bleeding such as spontaneous epistaxis,  hematuria or hematochezia.  I have reviewed the past medical history, past surgical history, social history and family history with the patient and they are unchanged from previous note.  ALLERGIES:  is allergic to latex; penicillins; and sulfonamide derivatives.  MEDICATIONS:  Current Outpatient Prescriptions  Medication Sig Dispense Refill  . acetaminophen-codeine (TYLENOL #3) 300-30 MG per tablet Take 1 tablet by mouth 4 (four) times daily as needed for pain.       Marland Kitchen allopurinol (ZYLOPRIM) 300 MG tablet Take 300 mg by mouth every morning.       . Calcium Carbonate-Vitamin D (CALCIUM 600+D) 600-400 MG-UNIT per tablet Take 1 tablet by mouth daily.      . cholecalciferol (VITAMIN D) 1000 UNITS tablet Take 1,000 Units by mouth 2 (two) times daily.      . citalopram (CELEXA) 20 MG tablet Take 20 mg by mouth every morning.       . Cyanocobalamin (VITAMIN B-12 SL) Place 1 tablet under the tongue daily.      . cyclobenzaprine (FLEXERIL) 5 MG tablet Take 1 tablet by mouth 2 (two) times daily.      . fluticasone (FLONASE) 50 MCG/ACT nasal spray Place 3 sprays into the nose daily.      . furosemide (LASIX) 40 MG tablet Take 40 mg by mouth daily as needed for fluid.       Marland Kitchen levothyroxine (SYNTHROID, LEVOTHROID) 75 MCG tablet Take 75 mcg by mouth daily before breakfast.       . methotrexate (RHEUMATREX) 2.5 MG tablet Take 2.5 mg by mouth once a week.  Caution:Chemotherapy. Protect from light.      . metoprolol tartrate (LOPRESSOR) 25 MG tablet Take 25 mg by mouth 2 (two) times daily.        . Multiple Vitamin (MULTIVITAMIN WITH MINERALS) TABS Take 1 tablet by mouth daily. Centrum      . ondansetron (ZOFRAN) 8 MG tablet Take 1 tablet (8 mg total) by mouth every 8 (eight) hours as needed for nausea.  30 tablet  3  . oxybutynin (DITROPAN) 5 MG tablet Take 5 mg by mouth Daily.      . predniSONE (DELTASONE) 20 MG tablet Take 10 mg by mouth daily with breakfast. Alternate with 10 mg every other day per Dr.  Alvy Bimler.      . predniSONE (DELTASONE) 5 MG tablet Take 1 tablet (5 mg total) by mouth daily with breakfast.  60 tablet  1   No current facility-administered medications for this visit.    REVIEW OF SYSTEMS:   Eyes: Denies blurriness of vision Ears, nose, mouth, throat, and face: Denies mucositis or sore throat Respiratory: Denies cough, dyspnea or wheezes Cardiovascular: Denies palpitation, chest discomfort or lower extremity swelling Gastrointestinal:  Denies nausea, heartburn or change in bowel habits Lymphatics: Denies new lymphadenopathy or easy bruising Neurological:Denies numbness, tingling or new weaknesses Behavioral/Psych: Mood is stable, no new changes  All other systems were reviewed with the patient and are negative.  PHYSICAL EXAMINATION: ECOG PERFORMANCE STATUS: 1 - Symptomatic but completely ambulatory  Filed Vitals:   05/25/13 0901  BP: 130/62  Pulse: 57  Temp: 98.3 F (36.8 C)  Resp: 20   Filed Weights   05/25/13 0901  Weight: 224 lb 14.4 oz (102.014 kg)    GENERAL:alert, no distress and comfortable. She is morbidly obese SKIN: skin color, texture, turgor are normal, no rashes or significant lesions EYES: normal, Conjunctiva are pink and non-injected, sclera clear OROPHARYNX:no exudate, no erythema and lips, buccal mucosa, and tongue normal  NECK: supple, thyroid normal size, non-tender, without nodularity LYMPH:  no palpable lymphadenopathy in the cervical, axillary or inguinal LUNGS: clear to auscultation and percussion with normal breathing effort HEART: regular rate & rhythm and no murmurs with mild bilateral lower extremity edema ABDOMEN:abdomen soft, non-tender and normal bowel sounds Musculoskeletal:no cyanosis of digits and no clubbing  NEURO: alert & oriented x 3 with fluent speech, no focal motor/sensory deficits  LABORATORY DATA:  I have reviewed the data as listed    Component Value Date/Time   NA 142 05/25/2013 0835   NA 144 08/02/2012  1500   K 4.5 05/25/2013 0835   K 4.0 08/02/2012 1500   CL 105 10/25/2012 1135   CL 103 08/02/2012 1500   CO2 27 05/25/2013 0835   CO2 28 08/02/2012 1500   GLUCOSE 109 05/25/2013 0835   GLUCOSE 85 10/25/2012 1135   GLUCOSE 76 08/02/2012 1500   BUN 20.8 05/25/2013 0835   BUN 15 08/02/2012 1500   CREATININE 1.2* 05/25/2013 0835   CREATININE 0.94 08/02/2012 1500   CREATININE 1.22* 09/08/2011 1120   CALCIUM 9.8 05/25/2013 0835   CALCIUM 10.3 08/02/2012 1500   PROT 6.8 05/25/2013 0835   PROT 7.9 08/02/2012 1500   ALBUMIN 4.1 05/25/2013 0835   ALBUMIN 4.1 08/02/2012 1500   AST 30 05/25/2013 0835   AST 29 08/02/2012 1500   ALT 26 05/25/2013 0835   ALT 38* 08/02/2012 1500   ALKPHOS 72 05/25/2013 0835   ALKPHOS 151* 08/02/2012 1500   BILITOT 0.73 05/25/2013 0835   BILITOT 0.3  08/02/2012 1500   GFRNONAA 61* 08/02/2012 1500   GFRAA 70* 08/02/2012 1500    No results found for this basename: SPEP,  UPEP,   kappa and lambda light chains    Lab Results  Component Value Date   WBC 3.6* 05/25/2013   NEUTROABS 2.8 05/25/2013   HGB 9.6* 05/25/2013   HCT 31.4* 05/25/2013   MCV 94.6 05/25/2013   PLT 144* 05/25/2013      Chemistry      Component Value Date/Time   NA 142 05/25/2013 0835   NA 144 08/02/2012 1500   K 4.5 05/25/2013 0835   K 4.0 08/02/2012 1500   CL 105 10/25/2012 1135   CL 103 08/02/2012 1500   CO2 27 05/25/2013 0835   CO2 28 08/02/2012 1500   BUN 20.8 05/25/2013 0835   BUN 15 08/02/2012 1500   CREATININE 1.2* 05/25/2013 0835   CREATININE 0.94 08/02/2012 1500   CREATININE 1.22* 09/08/2011 1120      Component Value Date/Time   CALCIUM 9.8 05/25/2013 0835   CALCIUM 10.3 08/02/2012 1500   ALKPHOS 72 05/25/2013 0835   ALKPHOS 151* 08/02/2012 1500   AST 30 05/25/2013 0835   AST 29 08/02/2012 1500   ALT 26 05/25/2013 0835   ALT 38* 08/02/2012 1500   BILITOT 0.73 05/25/2013 0835   BILITOT 0.3 08/02/2012 1500     ASSESSMENT & PLAN:  #1 LGL leukemia She is responding well to methotrexate. Thrombocytopenia had resolved. She is experiencing  multiple side effects.  I recommend we keep methotrexate 2.5 mg and continue prednisone taper to 10 mg alternate with 7.5 mg daily. My plan would be to repeat bone marrow aspirate and biopsy in a month or 2 to assess response to treatment. #2 severe anemia This is likely due to recent treatment and her underlying disease The patient denies recent history of bleeding such as epistaxis, hematuria or hematochezia. She received blood transfusion recently. Continue monitoring for now #3 mild thrombocytopenia, resolved This is positive sign she may be responding to treatment #4 severe leukopenia This is stable.This is likely due to recent treatment. The patient denies recent history of fevers, cough, chills, diarrhea or dysuria. She is asymptomatic from the leukopenia. I will observe for now.  #5 recent renal failure This is improving. I am wondering whether she may have mild tumor lysis syndrome at the start of her treatment and develop acute kidney injury. I will continue to hold her ACE inhibitor and Lasix. I recommend nephrology consultation and she agreed #6 history of hypertension Since with holding her antihypertensive, her blood pressure has been stable. We'll continue monitor carefully All questions were answered. The patient knows to call the clinic with any problems, questions or concerns. No barriers to learning was detected. I spent 25 minutes counseling the patient face to face. The total time spent in the appointment was 40 minutes and more than 50% was on counseling and review of test results     [facility], [place], MD 05/25/2013 9:29 AM

## 2013-05-25 NOTE — Telephone Encounter (Signed)
gv and printed appt sched and avs for pt for Jan 2015 °

## 2013-06-05 ENCOUNTER — Ambulatory Visit: Payer: Medicare Other | Admitting: Hematology and Oncology

## 2013-06-08 ENCOUNTER — Other Ambulatory Visit (HOSPITAL_BASED_OUTPATIENT_CLINIC_OR_DEPARTMENT_OTHER): Payer: Medicare Other

## 2013-06-08 ENCOUNTER — Ambulatory Visit (HOSPITAL_BASED_OUTPATIENT_CLINIC_OR_DEPARTMENT_OTHER): Payer: Medicare Other | Admitting: Hematology and Oncology

## 2013-06-08 ENCOUNTER — Telehealth: Payer: Self-pay | Admitting: Hematology and Oncology

## 2013-06-08 VITALS — BP 120/50 | HR 52 | Temp 98.1°F | Resp 18 | Ht 62.0 in | Wt 225.3 lb

## 2013-06-08 DIAGNOSIS — D649 Anemia, unspecified: Secondary | ICD-10-CM

## 2013-06-08 DIAGNOSIS — C91Z0 Other lymphoid leukemia not having achieved remission: Secondary | ICD-10-CM

## 2013-06-08 DIAGNOSIS — D72819 Decreased white blood cell count, unspecified: Secondary | ICD-10-CM

## 2013-06-08 DIAGNOSIS — I10 Essential (primary) hypertension: Secondary | ICD-10-CM

## 2013-06-08 LAB — CBC WITH DIFFERENTIAL/PLATELET
BASO%: 0.6 % (ref 0.0–2.0)
Basophils Absolute: 0 10*3/uL (ref 0.0–0.1)
EOS%: 2.8 % (ref 0.0–7.0)
Eosinophils Absolute: 0.1 10*3/uL (ref 0.0–0.5)
HCT: 30.7 % — ABNORMAL LOW (ref 34.8–46.6)
HGB: 9.5 g/dL — ABNORMAL LOW (ref 11.6–15.9)
LYMPH#: 0.9 10*3/uL (ref 0.9–3.3)
LYMPH%: 28.6 % (ref 14.0–49.7)
MCH: 29.4 pg (ref 25.1–34.0)
MCHC: 30.9 g/dL — ABNORMAL LOW (ref 31.5–36.0)
MCV: 95 fL (ref 79.5–101.0)
MONO#: 0.2 10*3/uL (ref 0.1–0.9)
MONO%: 4.6 % (ref 0.0–14.0)
NEUT%: 63.4 % (ref 38.4–76.8)
NEUTROS ABS: 2.1 10*3/uL (ref 1.5–6.5)
NRBC: 0 % (ref 0–0)
Platelets: 143 10*3/uL — ABNORMAL LOW (ref 145–400)
RBC: 3.23 10*6/uL — AB (ref 3.70–5.45)
RDW: 18.1 % — ABNORMAL HIGH (ref 11.2–14.5)
WBC: 3.3 10*3/uL — AB (ref 3.9–10.3)

## 2013-06-08 LAB — COMPREHENSIVE METABOLIC PANEL (CC13)
ALBUMIN: 3.7 g/dL (ref 3.5–5.0)
ALT: 20 U/L (ref 0–55)
AST: 30 U/L (ref 5–34)
Alkaline Phosphatase: 71 U/L (ref 40–150)
Anion Gap: 12 mEq/L — ABNORMAL HIGH (ref 3–11)
BUN: 14.9 mg/dL (ref 7.0–26.0)
CALCIUM: 9.4 mg/dL (ref 8.4–10.4)
CHLORIDE: 105 meq/L (ref 98–109)
CO2: 27 mEq/L (ref 22–29)
Creatinine: 1.1 mg/dL (ref 0.6–1.1)
Glucose: 92 mg/dl (ref 70–140)
POTASSIUM: 3.9 meq/L (ref 3.5–5.1)
SODIUM: 144 meq/L (ref 136–145)
TOTAL PROTEIN: 6 g/dL — AB (ref 6.4–8.3)
Total Bilirubin: 0.57 mg/dL (ref 0.20–1.20)

## 2013-06-08 LAB — HOLD TUBE, BLOOD BANK

## 2013-06-08 NOTE — Telephone Encounter (Signed)
Gave pt appt for lab and MD on Febraury 2015

## 2013-06-09 NOTE — Progress Notes (Signed)
[facility] OFFICE PROGRESS NOTE  Patient Care Team: Madelyn Brunner, MD as PCP - General (Unknown Physician Specialty) Heath Lark, MD as Consulting Physician (Hematology and Oncology)  DIAGNOSIS: LGL leukemia  SUMMARY OF ONCOLOGIC HISTORY: This is a patient it was seen here approximately 4 months ago. She was found to have anemia and was placed on observation. The cause of the anemia was thought to be related to chronic kidney disease. The patient also has history of Roux-en-Y gastric bypass and it was thought to be the contributing factor. 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 On 04/09/2013, she was started on prednisone 60 mg daily and methotrexate 7.5 mg once a week by mouth On 04/16/2013, she developed acute renal failure with hyperkalemia. Prednisone dose was reduced to 40 mg a day and methotrexate was placed on hold On 04/23/2013, acute renal failure was improving. The prednisone dose was reduced to 20 mg a day and would resume methotrexate at 7.5 mg once a week. On 04/27/2013, I reduced methotrexate to 5 mg once a week and prednisone dose was reduced to 10 mg alternate with 20 mg On 05/04/2013, I order one unit of blood transfusion. I The prednisone dose the same but reduced methotrexate to 2.5 mg once a week On 05/11/2013, I started to reduce prednisone dose further to 10 mg daily but to keep methotrexate at 2.5 mg once a week On 05/25/2013, I further reduce prednisone to 10 mg alternate with 7.5 mg and keep methotrexate at 2.5 mg once a week On 06/09/2013, and I further reduce prednisone to 5 mg daily and keep methotrexate at 2.5 mg once a week INTERVAL HISTORY: Stacey Cooley 73 y.o. female returns for further followup. She is doing well. Her appetite is stable. She denies any recent fever, chills, night sweats or abnormal weight loss  I have reviewed the past medical history, past  surgical history, social history and family history with the patient and they are unchanged from previous note.  ALLERGIES:  is allergic to latex; penicillins; and sulfonamide derivatives.  MEDICATIONS:  Current Outpatient Prescriptions  Medication Sig Dispense Refill  . acetaminophen-codeine (TYLENOL #3) 300-30 MG per tablet Take 1 tablet by mouth 4 (four) times daily as needed for pain.       Marland Kitchen allopurinol (ZYLOPRIM) 300 MG tablet Take 300 mg by mouth every morning.       . Calcium Carbonate-Vitamin D (CALCIUM 600+D) 600-400 MG-UNIT per tablet Take 1 tablet by mouth daily.      . cholecalciferol (VITAMIN D) 1000 UNITS tablet Take 1,000 Units by mouth 2 (two) times daily.      . citalopram (CELEXA) 20 MG tablet Take 20 mg by mouth every morning.       . Cyanocobalamin (VITAMIN B-12 SL) Place 1 tablet under the tongue daily.      . fluticasone (FLONASE) 50 MCG/ACT nasal spray Place 3 sprays into the nose daily.      Marland Kitchen levothyroxine (SYNTHROID, LEVOTHROID) 75 MCG tablet Take 75 mcg by mouth daily before breakfast.       . methotrexate (RHEUMATREX) 2.5 MG tablet Take 2.5 mg by mouth once a week. Caution:Chemotherapy. Protect from light.      . metoprolol tartrate (LOPRESSOR) 25 MG tablet Take 25 mg by mouth 2 (two) times daily.        . Multiple Vitamin (MULTIVITAMIN WITH MINERALS) TABS Take 1 tablet by mouth daily. Centrum      .  ondansetron (ZOFRAN) 8 MG tablet Take 1 tablet (8 mg total) by mouth every 8 (eight) hours as needed for nausea.  30 tablet  3  . oxybutynin (DITROPAN) 5 MG tablet Take 5 mg by mouth Daily.      . predniSONE (DELTASONE) 20 MG tablet Take 10 mg by mouth daily with breakfast. Alternate with 10 mg every other day per Dr. Alvy Bimler.      . predniSONE (DELTASONE) 5 MG tablet Take 1 tablet (5 mg total) by mouth daily with breakfast.  60 tablet  1  . cyclobenzaprine (FLEXERIL) 5 MG tablet Take 1 tablet by mouth 2 (two) times daily.      . furosemide (LASIX) 40 MG tablet Take 40  mg by mouth daily as needed for fluid.        No current facility-administered medications for this visit.    REVIEW OF SYSTEMS:   Constitutional: Denies fevers, chills or abnormal weight loss Eyes: Denies blurriness of vision Ears, nose, mouth, throat, and face: Denies mucositis or sore throat Respiratory: Denies cough, dyspnea or wheezes Cardiovascular: Denies palpitation, chest discomfort or lower extremity swelling Gastrointestinal:  Denies nausea, heartburn or change in bowel habits Skin: Denies abnormal skin rashes Lymphatics: Denies new lymphadenopathy or easy bruising Neurological:Denies numbness, tingling or new weaknesses Behavioral/Psych: Mood is stable, no new changes  All other systems were reviewed with the patient and are negative.  PHYSICAL EXAMINATION: ECOG PERFORMANCE STATUS: 0 - Asymptomatic  Filed Vitals:   06/08/13 0847  BP: 120/50  Pulse: 52  Temp: 98.1 F (36.7 C)  Resp: 18   Filed Weights   06/08/13 0847  Weight: 225 lb 4.8 oz (102.195 kg)    GENERAL:alert, no distress and comfortable SKIN: skin color, texture, turgor are normal, no rashes or significant lesions EYES: normal, Conjunctiva are pink and non-injected, sclera clear OROPHARYNX:no exudate, no erythema and lips, buccal mucosa, and tongue normal  NECK: supple, thyroid normal size, non-tender, without nodularity LYMPH:  no palpable lymphadenopathy in the cervical, axillary or inguinal LUNGS: clear to auscultation and percussion with normal breathing effort HEART: regular rate & rhythm and no murmurs and no lower extremity edema ABDOMEN:abdomen soft, non-tender and normal bowel sounds Musculoskeletal:no cyanosis of digits and no clubbing  NEURO: alert & oriented x 3 with fluent speech, no focal motor/sensory deficits  LABORATORY DATA:  I have reviewed the data as listed    Component Value Date/Time   NA 144 06/08/2013 0827   NA 144 08/02/2012 1500   K 3.9 06/08/2013 0827   K 4.0  08/02/2012 1500   CL 105 10/25/2012 1135   CL 103 08/02/2012 1500   CO2 27 06/08/2013 0827   CO2 28 08/02/2012 1500   GLUCOSE 92 06/08/2013 0827   GLUCOSE 85 10/25/2012 1135   GLUCOSE 76 08/02/2012 1500   BUN 14.9 06/08/2013 0827   BUN 15 08/02/2012 1500   CREATININE 1.1 06/08/2013 0827   CREATININE 0.94 08/02/2012 1500   CREATININE 1.22* 09/08/2011 1120   CALCIUM 9.4 06/08/2013 0827   CALCIUM 10.3 08/02/2012 1500   PROT 6.0* 06/08/2013 0827   PROT 7.9 08/02/2012 1500   ALBUMIN 3.7 06/08/2013 0827   ALBUMIN 4.1 08/02/2012 1500   AST 30 06/08/2013 0827   AST 29 08/02/2012 1500   ALT 20 06/08/2013 0827   ALT 38* 08/02/2012 1500   ALKPHOS 71 06/08/2013 0827   ALKPHOS 151* 08/02/2012 1500   BILITOT 0.57 06/08/2013 0827   BILITOT 0.3 08/02/2012 1500   GFRNONAA  61* 08/02/2012 1500   GFRAA 70* 08/02/2012 1500    No results found for this basename: SPEP,  UPEP,   kappa and lambda light chains    Lab Results  Component Value Date   WBC 3.3* 06/08/2013   NEUTROABS 2.1 06/08/2013   HGB 9.5* 06/08/2013   HCT 30.7* 06/08/2013   MCV 95.0 06/08/2013   PLT 143* 06/08/2013      Chemistry      Component Value Date/Time   NA 144 06/08/2013 0827   NA 144 08/02/2012 1500   K 3.9 06/08/2013 0827   K 4.0 08/02/2012 1500   CL 105 10/25/2012 1135   CL 103 08/02/2012 1500   CO2 27 06/08/2013 0827   CO2 28 08/02/2012 1500   BUN 14.9 06/08/2013 0827   BUN 15 08/02/2012 1500   CREATININE 1.1 06/08/2013 0827   CREATININE 0.94 08/02/2012 1500   CREATININE 1.22* 09/08/2011 1120      Component Value Date/Time   CALCIUM 9.4 06/08/2013 0827   CALCIUM 10.3 08/02/2012 1500   ALKPHOS 71 06/08/2013 0827   ALKPHOS 151* 08/02/2012 1500   AST 30 06/08/2013 0827   AST 29 08/02/2012 1500   ALT 20 06/08/2013 0827   ALT 38* 08/02/2012 1500   BILITOT 0.57 06/08/2013 0827   BILITOT 0.3 08/02/2012 1500     ASSESSMENT & PLAN:  #1 LGL leukemia She is responding well to methotrexate. Thrombocytopenia had resolved. She is experiencing multiple side  effects.  I recommend we keep methotrexate 2.5 mg and continue prednisone taper to 5 mg daily. My plan would be to repeat bone marrow aspirate and biopsy in a month or 2 to assess response to treatment. #2 severe anemia This is likely due to recent treatment and her underlying disease The patient denies recent history of bleeding such as epistaxis, hematuria or hematochezia. She received blood transfusion recently. Continue monitoring for now #3 mild thrombocytopenia, resolved This is positive sign she may be responding to treatment #4 severe leukopenia This is stable.This is likely due to recent treatment. The patient denies recent history of fevers, cough, chills, diarrhea or dysuria. She is asymptomatic from the leukopenia. I will observe for now.  #5 recent renal failure This has resolved. I am wondering whether she may have mild tumor lysis syndrome at the start of her treatment and develop acute kidney injury. I will continue to hold her ACE inhibitor and Lasix. I will defer nephrology consultation for now and she agreed #6 history of hypertension Since with holding her antihypertensive, her blood pressure has been stable. We'll continue monitor carefully All questions were answered. The patient knows to call the clinic with any problems, questions or concerns. No barriers to learning was detected. I spent 15 minutes counseling the patient face to face. The total time spent in the appointment was 20 minutes and more than 50% was on counseling and review of test results     [facility], [place], MD 06/09/2013 7:29 AM

## 2013-06-11 ENCOUNTER — Telehealth: Payer: Self-pay | Admitting: *Deleted

## 2013-06-11 NOTE — Telephone Encounter (Signed)
Informed pt of kidney function lab wnl now and no need to see Kidney Specialist.  Pt states has not been called for appt w/ Kidney doctor yet.  She verbalized understanding that she does not need to make this appt now and to f/u w/ Dr. Alvy Bimler on 2/16 as scheduled.

## 2013-06-11 NOTE — Telephone Encounter (Signed)
Message copied by Cathlean Cower on Mon Jun 11, 2013  8:20 AM ------      Message from: [facility], [place]      Created: Sat Jun 09, 2013  7:26 AM      Regarding: call pt with results       Please let her know creatinine is down to normal, no need nephrology consult ------

## 2013-07-02 ENCOUNTER — Other Ambulatory Visit: Payer: Medicare Other

## 2013-07-09 ENCOUNTER — Ambulatory Visit (HOSPITAL_BASED_OUTPATIENT_CLINIC_OR_DEPARTMENT_OTHER): Payer: Medicare Other | Admitting: Hematology and Oncology

## 2013-07-09 ENCOUNTER — Encounter: Payer: Self-pay | Admitting: Hematology and Oncology

## 2013-07-09 ENCOUNTER — Other Ambulatory Visit (HOSPITAL_BASED_OUTPATIENT_CLINIC_OR_DEPARTMENT_OTHER): Payer: Medicare Other

## 2013-07-09 ENCOUNTER — Telehealth: Payer: Self-pay | Admitting: Hematology and Oncology

## 2013-07-09 VITALS — BP 115/65 | HR 69 | Temp 97.1°F | Resp 18 | Ht 62.0 in | Wt 221.6 lb

## 2013-07-09 DIAGNOSIS — C91Z0 Other lymphoid leukemia not having achieved remission: Secondary | ICD-10-CM

## 2013-07-09 DIAGNOSIS — J3489 Other specified disorders of nose and nasal sinuses: Secondary | ICD-10-CM

## 2013-07-09 DIAGNOSIS — N959 Unspecified menopausal and perimenopausal disorder: Secondary | ICD-10-CM

## 2013-07-09 DIAGNOSIS — D72819 Decreased white blood cell count, unspecified: Secondary | ICD-10-CM

## 2013-07-09 DIAGNOSIS — D649 Anemia, unspecified: Secondary | ICD-10-CM

## 2013-07-09 DIAGNOSIS — R0981 Nasal congestion: Secondary | ICD-10-CM | POA: Insufficient documentation

## 2013-07-09 HISTORY — DX: Nasal congestion: R09.81

## 2013-07-09 LAB — CBC WITH DIFFERENTIAL/PLATELET
BASO%: 0.9 % (ref 0.0–2.0)
Basophils Absolute: 0 10*3/uL (ref 0.0–0.1)
EOS ABS: 0.1 10*3/uL (ref 0.0–0.5)
EOS%: 2.8 % (ref 0.0–7.0)
HCT: 31.9 % — ABNORMAL LOW (ref 34.8–46.6)
HGB: 10.2 g/dL — ABNORMAL LOW (ref 11.6–15.9)
LYMPH%: 43.9 % (ref 14.0–49.7)
MCH: 29.8 pg (ref 25.1–34.0)
MCHC: 32.1 g/dL (ref 31.5–36.0)
MCV: 92.9 fL (ref 79.5–101.0)
MONO#: 0.2 10*3/uL (ref 0.1–0.9)
MONO%: 6.8 % (ref 0.0–14.0)
NEUT%: 45.6 % (ref 38.4–76.8)
NEUTROS ABS: 1.2 10*3/uL — AB (ref 1.5–6.5)
Platelets: 142 10*3/uL — ABNORMAL LOW (ref 145–400)
RBC: 3.43 10*6/uL — AB (ref 3.70–5.45)
RDW: 18.3 % — ABNORMAL HIGH (ref 11.2–14.5)
WBC: 2.7 10*3/uL — ABNORMAL LOW (ref 3.9–10.3)
lymph#: 1.2 10*3/uL (ref 0.9–3.3)

## 2013-07-09 LAB — COMPREHENSIVE METABOLIC PANEL (CC13)
ALT: 23 U/L (ref 0–55)
AST: 29 U/L (ref 5–34)
Albumin: 4 g/dL (ref 3.5–5.0)
Alkaline Phosphatase: 65 U/L (ref 40–150)
Anion Gap: 9 mEq/L (ref 3–11)
BILIRUBIN TOTAL: 0.5 mg/dL (ref 0.20–1.20)
BUN: 14.6 mg/dL (ref 7.0–26.0)
CO2: 24 mEq/L (ref 22–29)
Calcium: 9.9 mg/dL (ref 8.4–10.4)
Chloride: 109 mEq/L (ref 98–109)
Creatinine: 1.2 mg/dL — ABNORMAL HIGH (ref 0.6–1.1)
GLUCOSE: 83 mg/dL (ref 70–140)
Potassium: 4.1 mEq/L (ref 3.5–5.1)
SODIUM: 142 meq/L (ref 136–145)
TOTAL PROTEIN: 6.3 g/dL — AB (ref 6.4–8.3)

## 2013-07-09 NOTE — Telephone Encounter (Signed)
gv pt appt schedule for feb. [facility] will call pt w/ct appt - pt aware.

## 2013-07-09 NOTE — Progress Notes (Signed)
[place] OFFICE PROGRESS NOTE  Patient Care Team: Madelyn Brunner, MD as PCP - General (Unknown Physician Specialty) Heath Lark, MD as Consulting Physician (Hematology and Oncology)  DIAGNOSIS: LGL leukemia  SUMMARY OF ONCOLOGIC HISTORY: She was found to have anemia and was placed on observation. The cause of the anemia was thought to be related to chronic kidney disease. The patient also has history of Roux-en-Y gastric bypass and it was thought to be the contributing factor. 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 On 04/09/2013, she was started on prednisone 60 mg daily and methotrexate 7.5 mg once a week by mouth On 04/16/2013, she developed acute renal failure with hyperkalemia. Prednisone dose was reduced to 40 mg a day and methotrexate was placed on hold On 04/23/2013, acute renal failure was improving. The prednisone dose was reduced to 20 mg a day and would resume methotrexate at 7.5 mg once a week. On 04/27/2013, I reduced methotrexate to 5 mg once a week and prednisone dose was reduced to 10 mg alternate with 20 mg On 05/04/2013, I order one unit of blood transfusion. I The prednisone dose the same but reduced methotrexate to 2.5 mg once a week On 05/11/2013, I started to reduce prednisone dose further to 10 mg daily but to keep methotrexate at 2.5 mg once a week On 05/25/2013, I further reduce prednisone to 10 mg alternate with 7.5 mg and keep methotrexate at 2.5 mg once a week On 06/09/2013, and I further reduce prednisone to 5 mg daily and keep methotrexate at 2.5 mg once a week  INTERVAL HISTORY: Stacey Cooley 73 y.o. female returns for further followup. Since last time I saw her, over the past 3 weeks, she has been having intermittent hot flashes and sweating. The intensity of the hot flashes is getting worse. It happens maybe 3 times a week. Her last episode several days ago  lasted for about half an hour to 45 minutes. The sweats are drenching sweats. She also complained of severe congestion and drainage from her sinuses. She denies any fevers or chills. Denies any cough. No sore throat.  I have reviewed the past medical history, past surgical history, social history and family history with the patient and they are unchanged from previous note.  ALLERGIES:  is allergic to latex; penicillins; and sulfonamide derivatives.  MEDICATIONS:  Current Outpatient Prescriptions  Medication Sig Dispense Refill  . acetaminophen-codeine (TYLENOL #3) 300-30 MG per tablet Take 1 tablet by mouth 4 (four) times daily as needed for pain.       Marland Kitchen allopurinol (ZYLOPRIM) 300 MG tablet Take 300 mg by mouth every morning.       . Calcium Carbonate-Vitamin D (CALCIUM 600+D) 600-400 MG-UNIT per tablet Take 1 tablet by mouth daily.      . cholecalciferol (VITAMIN D) 1000 UNITS tablet Take 1,000 Units by mouth 2 (two) times daily.      . citalopram (CELEXA) 20 MG tablet Take 20 mg by mouth every morning.       . Cyanocobalamin (VITAMIN B-12 SL) Place 1 tablet under the tongue daily.      . cyclobenzaprine (FLEXERIL) 5 MG tablet Take 1 tablet by mouth 2 (two) times daily.      . fluticasone (FLONASE) 50 MCG/ACT nasal spray Place 3 sprays into the nose daily.      . furosemide (LASIX) 40 MG tablet Take 40 mg by mouth daily as needed  for fluid.       Marland Kitchen levothyroxine (SYNTHROID, LEVOTHROID) 75 MCG tablet Take 75 mcg by mouth daily before breakfast.       . methotrexate (RHEUMATREX) 2.5 MG tablet Take 2.5 mg by mouth once a week. Caution:Chemotherapy. Protect from light.      . metoprolol tartrate (LOPRESSOR) 25 MG tablet Take 25 mg by mouth 2 (two) times daily.        . Multiple Vitamin (MULTIVITAMIN WITH MINERALS) TABS Take 1 tablet by mouth daily. Centrum      . ondansetron (ZOFRAN) 8 MG tablet Take 1 tablet (8 mg total) by mouth every 8 (eight) hours as needed for nausea.  30 tablet  3  .  oxybutynin (DITROPAN) 5 MG tablet Take 5 mg by mouth Daily.      . predniSONE (DELTASONE) 20 MG tablet Take 10 mg by mouth daily with breakfast. Alternate with 10 mg every other day per Dr. Alvy Bimler.      . predniSONE (DELTASONE) 5 MG tablet Take 1 tablet (5 mg total) by mouth daily with breakfast.  60 tablet  1   No current facility-administered medications for this visit.    REVIEW OF SYSTEMS:   Constitutional: Denies fevers, chills or abnormal weight loss Eyes: Denies blurriness of vision Ears, nose, mouth, throat, and face: Denies mucositis or sore throat Respiratory: Denies cough, dyspnea or wheezes Cardiovascular: Denies palpitation, chest discomfort or lower extremity swelling Gastrointestinal:  Denies nausea, heartburn or change in bowel habits Skin: Denies abnormal skin rashes Lymphatics: Denies new lymphadenopathy or easy bruising Neurological:Denies numbness, tingling or new weaknesses Behavioral/Psych: Mood is stable, no new changes  All other systems were reviewed with the patient and are negative.  PHYSICAL EXAMINATION: ECOG PERFORMANCE STATUS: 1 - Symptomatic but completely ambulatory  Filed Vitals:   07/09/13 0855  BP: 115/65  Pulse: 69  Temp: 97.1 F (36.2 C)  Resp: 18   Filed Weights   07/09/13 0855  Weight: 221 lb 9.6 oz (100.517 kg)    GENERAL:alert, no distress and comfortable. She is morbidly obese SKIN: skin color, texture, turgor are normal, no rashes or significant lesions EYES: normal, Conjunctiva are pink and non-injected, sclera clear OROPHARYNX:no exudate, no erythema and lips, buccal mucosa, and tongue normal  NECK: supple, thyroid normal size, non-tender, without nodularity LYMPH:  no palpable lymphadenopathy in the cervical, axillary or inguinal LUNGS: clear to auscultation and percussion with normal breathing effort HEART: regular rate & rhythm and no murmurs and no lower extremity edema ABDOMEN:abdomen soft, non-tender and normal bowel  sounds Musculoskeletal:no cyanosis of digits and no clubbing  NEURO: alert & oriented x 3 with fluent speech, no focal motor/sensory deficits  LABORATORY DATA:  I have reviewed the data as listed    Component Value Date/Time   NA 142 07/09/2013 0830   NA 144 08/02/2012 1500   K 4.1 07/09/2013 0830   K 4.0 08/02/2012 1500   CL 105 10/25/2012 1135   CL 103 08/02/2012 1500   CO2 24 07/09/2013 0830   CO2 28 08/02/2012 1500   GLUCOSE 83 07/09/2013 0830   GLUCOSE 85 10/25/2012 1135   GLUCOSE 76 08/02/2012 1500   BUN 14.6 07/09/2013 0830   BUN 15 08/02/2012 1500   CREATININE 1.2* 07/09/2013 0830   CREATININE 0.94 08/02/2012 1500   CREATININE 1.22* 09/08/2011 1120   CALCIUM 9.9 07/09/2013 0830   CALCIUM 10.3 08/02/2012 1500   PROT 6.3* 07/09/2013 0830   PROT 7.9 08/02/2012 1500   ALBUMIN  4.0 07/09/2013 0830   ALBUMIN 4.1 08/02/2012 1500   AST 29 07/09/2013 0830   AST 29 08/02/2012 1500   ALT 23 07/09/2013 0830   ALT 38* 08/02/2012 1500   ALKPHOS 65 07/09/2013 0830   ALKPHOS 151* 08/02/2012 1500   BILITOT 0.50 07/09/2013 0830   BILITOT 0.3 08/02/2012 1500   GFRNONAA 61* 08/02/2012 1500   GFRAA 70* 08/02/2012 1500    No results found for this basename: SPEP,  UPEP,   kappa and lambda light chains    Lab Results  Component Value Date   WBC 2.7* 07/09/2013   NEUTROABS 1.2* 07/09/2013   HGB 10.2* 07/09/2013   HCT 31.9* 07/09/2013   MCV 92.9 07/09/2013   PLT 142* 07/09/2013      Chemistry      Component Value Date/Time   NA 142 07/09/2013 0830   NA 144 08/02/2012 1500   K 4.1 07/09/2013 0830   K 4.0 08/02/2012 1500   CL 105 10/25/2012 1135   CL 103 08/02/2012 1500   CO2 24 07/09/2013 0830   CO2 28 08/02/2012 1500   BUN 14.6 07/09/2013 0830   BUN 15 08/02/2012 1500   CREATININE 1.2* 07/09/2013 0830   CREATININE 0.94 08/02/2012 1500   CREATININE 1.22* 09/08/2011 1120      Component Value Date/Time   CALCIUM 9.9 07/09/2013 0830   CALCIUM 10.3 08/02/2012 1500   ALKPHOS 65 07/09/2013 0830   ALKPHOS 151* 08/02/2012  1500   AST 29 07/09/2013 0830   AST 29 08/02/2012 1500   ALT 23 07/09/2013 0830   ALT 38* 08/02/2012 1500   BILITOT 0.50 07/09/2013 0830   BILITOT 0.3 08/02/2012 1500     ASSESSMENT & PLAN:  #1 LGL leukemia She is responding well to methotrexate. Thrombocytopenia had resolved. She is experiencing multiple side effects.  I recommend we keep methotrexate 2.5 mg and continue prednisone taper to 5 mg daily.  I will proceed with peripheral blood for T-cell rearrangement study to see if this is to detectable from her peripheral blood #2 anemia This is likely due to recent treatment and her underlying disease The patient denies recent history of bleeding such as epistaxis, hematuria or hematochezia. She received blood transfusion recently. Continue monitoring for now #3 mild thrombocytopenia, resolved This is positive sign she may be responding to treatment #4 severe leukopenia This is stable.This is likely due to recent treatment. #5 recent renal failure This has resolved. I am wondering whether she may have mild tumor lysis syndrome at the start of her treatment and develop acute kidney injury. I will continue to hold her ACE inhibitor and Lasix. I will defer nephrology consultation for now and she agreed #6 intermittent hot flashes and drenching sweats #7 severe sinus drainage The patient is immunocompromised from chronic prednisone therapy and methotrexate. I am wondering whether I could be missing an undiagnosed infection such as the severe sinus infection or yeast infection. I will proceed with CT of the sinuses now and see her next week. If the CT scan of the sinuses clear, I will have to do additional investigation to rule out other form of infections.  Orders Placed This Encounter  Procedures  . CT sinus facial bones with and without contrast    Standing Status: Future     Number of Occurrences:      Standing Expiration Date: 10/07/2014    Order Specific Question:  Reason for Exam  (SYMPTOM  OR DIAGNOSIS REQUIRED)    Answer:  severe  congestion and nasal discharge, r/o fungal infection    Order Specific Question:  Preferred imaging location?    Answer:  [facility]    Standing Status: Future     Number of Occurrences: 1     Standing Expiration Date: 07/09/2014    Order Specific Question:  Test Name:    Answer:  T cell receptor rearrangement study on today's lab   All questions were answered. The patient knows to call the clinic with any problems, questions or concerns. No barriers to learning was detected.    [facility], [place], MD 07/09/2013 9:42 AM

## 2013-07-10 ENCOUNTER — Telehealth: Payer: Self-pay | Admitting: *Deleted

## 2013-07-10 NOTE — Telephone Encounter (Signed)
Please reschedule to the day after

## 2013-07-10 NOTE — Telephone Encounter (Signed)
Husband left VM states pt's CT scan scheduled for 8 am on 2/23 and pt sees Dr. Alvy Bimler at 11 am same day.  He asks if Dr. Alvy Bimler will have results by then or need to r/s?

## 2013-07-10 NOTE — Telephone Encounter (Signed)
Informed pt of order to r/s office visit from 2/23 to 2/24 the day after CT scan.  She should be getting a call from one of our Schedulers with the new time.  She verbalized understanding.

## 2013-07-12 ENCOUNTER — Telehealth: Payer: Self-pay | Admitting: Hematology and Oncology

## 2013-07-12 NOTE — Telephone Encounter (Signed)
, °

## 2013-07-16 ENCOUNTER — Other Ambulatory Visit: Payer: Self-pay | Admitting: Hematology and Oncology

## 2013-07-16 ENCOUNTER — Ambulatory Visit: Payer: Medicare Other | Admitting: Hematology and Oncology

## 2013-07-16 ENCOUNTER — Ambulatory Visit (HOSPITAL_COMMUNITY)
Admission: RE | Admit: 2013-07-16 | Discharge: 2013-07-16 | Disposition: A | Discharge status: Home / Self Care | Payer: Medicare Other | Source: Ambulatory Visit | Attending: Hematology and Oncology | Admitting: Hematology and Oncology

## 2013-07-16 ENCOUNTER — Encounter (HOSPITAL_COMMUNITY): Payer: Self-pay

## 2013-07-16 DIAGNOSIS — R0981 Nasal congestion: Secondary | ICD-10-CM

## 2013-07-16 DIAGNOSIS — J3489 Other specified disorders of nose and nasal sinuses: Secondary | ICD-10-CM | POA: Insufficient documentation

## 2013-07-16 DIAGNOSIS — J029 Acute pharyngitis, unspecified: Secondary | ICD-10-CM | POA: Insufficient documentation

## 2013-07-16 DIAGNOSIS — R51 Headache: Secondary | ICD-10-CM | POA: Insufficient documentation

## 2013-07-16 DIAGNOSIS — R05 Cough: Secondary | ICD-10-CM | POA: Insufficient documentation

## 2013-07-16 DIAGNOSIS — R059 Cough, unspecified: Secondary | ICD-10-CM | POA: Insufficient documentation

## 2013-07-17 ENCOUNTER — Ambulatory Visit (HOSPITAL_BASED_OUTPATIENT_CLINIC_OR_DEPARTMENT_OTHER): Payer: Medicare Other | Admitting: Hematology and Oncology

## 2013-07-17 ENCOUNTER — Encounter: Payer: Self-pay | Admitting: Hematology and Oncology

## 2013-07-17 ENCOUNTER — Encounter: Payer: Self-pay | Admitting: *Deleted

## 2013-07-17 ENCOUNTER — Telehealth: Payer: Self-pay | Admitting: *Deleted

## 2013-07-17 ENCOUNTER — Telehealth: Payer: Self-pay | Admitting: Hematology and Oncology

## 2013-07-17 VITALS — BP 134/53 | HR 55 | Temp 97.9°F | Resp 18 | Ht 62.0 in | Wt 218.8 lb

## 2013-07-17 DIAGNOSIS — C91Z0 Other lymphoid leukemia not having achieved remission: Secondary | ICD-10-CM

## 2013-07-17 DIAGNOSIS — D696 Thrombocytopenia, unspecified: Secondary | ICD-10-CM

## 2013-07-17 DIAGNOSIS — D72819 Decreased white blood cell count, unspecified: Secondary | ICD-10-CM

## 2013-07-17 DIAGNOSIS — R519 Headache, unspecified: Secondary | ICD-10-CM

## 2013-07-17 DIAGNOSIS — D649 Anemia, unspecified: Secondary | ICD-10-CM

## 2013-07-17 DIAGNOSIS — R51 Headache: Secondary | ICD-10-CM

## 2013-07-17 HISTORY — DX: Headache, unspecified: R51.9

## 2013-07-17 MED ORDER — BUTALBITAL-APAP-CAFFEINE 50-325-40 MG PO TABS: 1.0000 | ORAL_TABLET | Freq: Four times a day (QID) | ORAL | Status: DC | PRN

## 2013-07-17 NOTE — Progress Notes (Signed)
RECEIVED A FAX FROM [facility] CONCERNING A PRIOR AUTHORIZATION FOR BUTALBITAL/APAP/CAFF [[email or web address]]. THIS REQUEST WAS PLACED IN THE MANAGED CARE BIN.

## 2013-07-17 NOTE — Progress Notes (Signed)
Faxed butalbital pa form to [facility]

## 2013-07-17 NOTE — Telephone Encounter (Signed)
Gave pt appt for March 2015

## 2013-07-17 NOTE — Telephone Encounter (Signed)
Husband states they are planning on coming in for appt as scheduled this afternoon.  He will call if anything changes.

## 2013-07-17 NOTE — Progress Notes (Signed)
[facility] OFFICE PROGRESS NOTE  Patient Care Team: Madelyn Brunner, MD as PCP - General (Unknown Physician Specialty) Heath Lark, MD as Consulting Physician (Hematology and Oncology)  DIAGNOSIS: LGL leukemia  SUMMARY OF ONCOLOGIC HISTORY: She was found to have anemia and was placed on observation. The cause of the anemia was thought to be related to chronic kidney disease. The patient also has history of Roux-en-Y gastric bypass and it was thought to be the contributing factor. 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 On 04/09/2013, she was started on prednisone 60 mg daily and methotrexate 7.5 mg once a week by mouth On 04/16/2013, she developed acute renal failure with hyperkalemia. Prednisone dose was reduced to 40 mg a day and methotrexate was placed on hold On 04/23/2013, acute renal failure was improving. The prednisone dose was reduced to 20 mg a day and would resume methotrexate at 7.5 mg once a week. On 04/27/2013, I reduced methotrexate to 5 mg once a week and prednisone dose was reduced to 10 mg alternate with 20 mg On 05/04/2013, I order one unit of blood transfusion. I The prednisone dose the same but reduced methotrexate to 2.5 mg once a week On 05/11/2013, I started to reduce prednisone dose further to 10 mg daily but to keep methotrexate at 2.5 mg once a week On 05/25/2013, I further reduce prednisone to 10 mg alternate with 7.5 mg and keep methotrexate at 2.5 mg once a week On 06/09/2013, and I further reduce prednisone to 5 mg daily and keep methotrexate at 2.5 mg once a week  INTERVAL HISTORY: Stacey Cooley 73 y.o. female returns for further followup. She still have frequent sweats. She complained of some congestion, headache and cough. Also complained of significant mouth dryness. She denies any recent fever, chills, night sweats or abnormal weight loss  I have reviewed  the past medical history, past surgical history, social history and family history with the patient and they are unchanged from previous note.  ALLERGIES:  is allergic to latex; penicillins; and sulfonamide derivatives.  MEDICATIONS:  Current Outpatient Prescriptions  Medication Sig Dispense Refill  . acetaminophen-codeine (TYLENOL #3) 300-30 MG per tablet Take 1 tablet by mouth 4 (four) times daily as needed for pain.       Marland Kitchen allopurinol (ZYLOPRIM) 300 MG tablet Take 300 mg by mouth every morning.       . Calcium Carbonate-Vitamin D (CALCIUM 600+D) 600-400 MG-UNIT per tablet Take 1 tablet by mouth daily.      . cholecalciferol (VITAMIN D) 1000 UNITS tablet Take 1,000 Units by mouth 2 (two) times daily.      . citalopram (CELEXA) 20 MG tablet Take 20 mg by mouth every morning.       . Cyanocobalamin (VITAMIN B-12 SL) Place 1 tablet under the tongue daily.      . cyclobenzaprine (FLEXERIL) 5 MG tablet Take 1 tablet by mouth 2 (two) times daily.      . fluticasone (FLONASE) 50 MCG/ACT nasal spray Place 3 sprays into the nose daily.      Marland Kitchen levothyroxine (SYNTHROID, LEVOTHROID) 75 MCG tablet Take 75 mcg by mouth daily before breakfast.       . methotrexate (RHEUMATREX) 2.5 MG tablet Take 2.5 mg by mouth once a week. Caution:Chemotherapy. Protect from light.      . metoprolol tartrate (LOPRESSOR) 25 MG tablet Take 25 mg by mouth 2 (two) times daily.        Marland Kitchen  Multiple Vitamin (MULTIVITAMIN WITH MINERALS) TABS Take 1 tablet by mouth daily. Centrum      . ondansetron (ZOFRAN) 8 MG tablet Take 1 tablet (8 mg total) by mouth every 8 (eight) hours as needed for nausea.  30 tablet  3  . oxybutynin (DITROPAN) 5 MG tablet Take 5 mg by mouth Daily.      . predniSONE (DELTASONE) 20 MG tablet Take 5 mg by mouth daily with breakfast. Alternate with 10 mg every other day per Dr. Alvy Bimler.      . predniSONE (DELTASONE) 5 MG tablet Take 1 tablet (5 mg total) by mouth daily with breakfast.  60 tablet  1  .  butalbital-acetaminophen-caffeine (FIORICET, ESGIC) 50-325-40 MG per tablet Take 1 tablet by mouth every 6 (six) hours as needed for headache.  30 tablet  0   No current facility-administered medications for this visit.    REVIEW OF SYSTEMS:   All other systems were reviewed with the patient and are negative.  PHYSICAL EXAMINATION: ECOG PERFORMANCE STATUS: 1 - Symptomatic but completely ambulatory  Filed Vitals:   07/17/13 1246  BP: 134/53  Pulse: 55  Temp: 97.9 F (36.6 C)  Resp: 18   Filed Weights   07/17/13 1246  Weight: 218 lb 12.8 oz (99.247 kg)    GENERAL:alert, no distress and comfortable. She is morbidly obese Musculoskeletal:no cyanosis of digits and no clubbing  NEURO: alert & oriented x 3 with fluent speech, no focal motor/sensory deficits  LABORATORY DATA:  I have reviewed the data as listed    Component Value Date/Time   NA 142 07/09/2013 0830   NA 144 08/02/2012 1500   K 4.1 07/09/2013 0830   K 4.0 08/02/2012 1500   CL 105 10/25/2012 1135   CL 103 08/02/2012 1500   CO2 24 07/09/2013 0830   CO2 28 08/02/2012 1500   GLUCOSE 83 07/09/2013 0830   GLUCOSE 85 10/25/2012 1135   GLUCOSE 76 08/02/2012 1500   BUN 14.6 07/09/2013 0830   BUN 15 08/02/2012 1500   CREATININE 1.2* 07/09/2013 0830   CREATININE 0.94 08/02/2012 1500   CREATININE 1.22* 09/08/2011 1120   CALCIUM 9.9 07/09/2013 0830   CALCIUM 10.3 08/02/2012 1500   PROT 6.3* 07/09/2013 0830   PROT 7.9 08/02/2012 1500   ALBUMIN 4.0 07/09/2013 0830   ALBUMIN 4.1 08/02/2012 1500   AST 29 07/09/2013 0830   AST 29 08/02/2012 1500   ALT 23 07/09/2013 0830   ALT 38* 08/02/2012 1500   ALKPHOS 65 07/09/2013 0830   ALKPHOS 151* 08/02/2012 1500   BILITOT 0.50 07/09/2013 0830   BILITOT 0.3 08/02/2012 1500   GFRNONAA 61* 08/02/2012 1500   GFRAA 70* 08/02/2012 1500    No results found for this basename: SPEP,  UPEP,   kappa and lambda light chains    Lab Results  Component Value Date   WBC 2.7* 07/09/2013   NEUTROABS 1.2* 07/09/2013    HGB 10.2* 07/09/2013   HCT 31.9* 07/09/2013   MCV 92.9 07/09/2013   PLT 142* 07/09/2013      Chemistry      Component Value Date/Time   NA 142 07/09/2013 0830   NA 144 08/02/2012 1500   K 4.1 07/09/2013 0830   K 4.0 08/02/2012 1500   CL 105 10/25/2012 1135   CL 103 08/02/2012 1500   CO2 24 07/09/2013 0830   CO2 28 08/02/2012 1500   BUN 14.6 07/09/2013 0830   BUN 15 08/02/2012 1500   CREATININE 1.2* 07/09/2013  0830   CREATININE 0.94 08/02/2012 1500   CREATININE 1.22* 09/08/2011 1120      Component Value Date/Time   CALCIUM 9.9 07/09/2013 0830   CALCIUM 10.3 08/02/2012 1500   ALKPHOS 65 07/09/2013 0830   ALKPHOS 151* 08/02/2012 1500   AST 29 07/09/2013 0830   AST 29 08/02/2012 1500   ALT 23 07/09/2013 0830   ALT 38* 08/02/2012 1500   BILITOT 0.50 07/09/2013 0830   BILITOT 0.3 08/02/2012 1500     RADIOGRAPHIC STUDIES: I reviewed the images with the patient and her husband I have personally reviewed the radiological images as listed and agreed with the findings in the report. Ct Maxillofacial Wo Cm  07/16/2013   CLINICAL DATA:  Sinus congestion. Frontal headache and bilateral maxillary pain. Cough, congestion, and sore throat. Nasal discharge. Question fungal infection.  EXAM: CT MAXILLOFACIAL WITHOUT CONTRAST  TECHNIQUE: Multidetector CT imaging of the maxillofacial structures was performed. Multiplanar CT image reconstructions were also generated. A small metallic BB was placed on the right temple in order to reliably differentiate right from left.  COMPARISON:  CT of the cervical spine 07/26/2012.  FINDINGS: And and the paranasal sinuses are clear. The ostiomeatal complex is patent bilaterally. The ethmoid bulla is more prominent left than right. Slight rightward nasal septal deviation is present without significant compromise. The nasal cavity is clear.  Limited imaging of the brain is unremarkable.  Bilateral C1 screws are again noted.  IMPRESSION: 1. No evidence for acute or chronic sinusitis. 2.  Postsurgical changes at C1.   Electronically Signed   By: Lawrence Santiago M.D.   On: 07/16/2013 08:38    ASSESSMENT & PLAN:  #1 LGL leukemia The patient still has persistent disease. Overall her blood work is improved compared to before. I recommend we hold off doing a bone marrow until April to allow more time for the medications to work. At present time, I recommend she continue methotrexate 2.5 mg weekly and to continue prednisone at 5 mg daily #2 anemia #3 mild thrombocytopenia #4 mild leukopenia This is related to disease. She cannot tolerate higher doses of methotrexate. We'll continue the same #5 hot flashes/sweats I suspect this is related to disease. I recommend conservative management #6 sinus drainage and headache CT scan of the sinuses showed no evidence of infection. I suspect her chronic prednisone therapy may have suppressed some of the sinusitis. I gave her a small prescription of Fioricet to treat her headache. Orders Placed This Encounter  Procedures  . Comprehensive metabolic panel    Standing Status: Future     Number of Occurrences:      Standing Expiration Date: 07/17/2014  . CBC with Differential    Standing Status: Future     Number of Occurrences:      Standing Expiration Date: 07/17/2014   All questions were answered. The patient knows to call the clinic with any problems, questions or concerns. No barriers to learning was detected. I spent 15 minutes counseling the patient face to face. The total time spent in the appointment was 20 minutes and more than 50% was on counseling and review of test results     [facility], [place], MD 07/17/2013 2:18 PM

## 2013-07-18 ENCOUNTER — Encounter: Payer: Self-pay | Admitting: Hematology and Oncology

## 2013-07-18 NOTE — Progress Notes (Signed)
[employer] approved butalb-acet-caff from 07/17/13-05/23/14

## 2013-07-31 ENCOUNTER — Telehealth: Payer: Self-pay | Admitting: *Deleted

## 2013-07-31 NOTE — Telephone Encounter (Signed)
Pt left VM requesting refill on Fioricet from Dr. Alvy Bimler.

## 2013-08-01 ENCOUNTER — Telehealth: Payer: Self-pay | Admitting: *Deleted

## 2013-08-01 ENCOUNTER — Other Ambulatory Visit: Payer: Self-pay | Admitting: Hematology and Oncology

## 2013-08-01 DIAGNOSIS — R51 Headache: Principal | ICD-10-CM

## 2013-08-01 DIAGNOSIS — R519 Headache, unspecified: Secondary | ICD-10-CM

## 2013-08-01 MED ORDER — BUTALBITAL-APAP-CAFFEINE 50-325-40 MG PO TABS: 1.0000 | ORAL_TABLET | Freq: Four times a day (QID) | ORAL | Status: DC | PRN

## 2013-08-01 NOTE — Telephone Encounter (Signed)
Husband left another VM requesting refill for pt on butalbital-acetaminophen-caffeine (FIORICET, ESGIC) 50-325-40 mg.

## 2013-08-01 NOTE — Telephone Encounter (Signed)
Informed husband of Rx ready to pick up.  He verbalized understanding.

## 2013-08-14 ENCOUNTER — Other Ambulatory Visit (HOSPITAL_BASED_OUTPATIENT_CLINIC_OR_DEPARTMENT_OTHER): Payer: Medicare Other

## 2013-08-14 ENCOUNTER — Ambulatory Visit (HOSPITAL_BASED_OUTPATIENT_CLINIC_OR_DEPARTMENT_OTHER): Payer: Medicare Other | Admitting: Hematology and Oncology

## 2013-08-14 ENCOUNTER — Telehealth: Payer: Self-pay | Admitting: Hematology and Oncology

## 2013-08-14 ENCOUNTER — Encounter: Payer: Self-pay | Admitting: Hematology and Oncology

## 2013-08-14 VITALS — BP 127/61 | HR 54 | Temp 97.6°F | Resp 18 | Ht 62.0 in | Wt 223.1 lb

## 2013-08-14 DIAGNOSIS — D696 Thrombocytopenia, unspecified: Secondary | ICD-10-CM

## 2013-08-14 DIAGNOSIS — D72819 Decreased white blood cell count, unspecified: Secondary | ICD-10-CM

## 2013-08-14 DIAGNOSIS — C91Z0 Other lymphoid leukemia not having achieved remission: Secondary | ICD-10-CM

## 2013-08-14 DIAGNOSIS — R61 Generalized hyperhidrosis: Secondary | ICD-10-CM

## 2013-08-14 DIAGNOSIS — D649 Anemia, unspecified: Secondary | ICD-10-CM

## 2013-08-14 DIAGNOSIS — R51 Headache: Secondary | ICD-10-CM

## 2013-08-14 DIAGNOSIS — R609 Edema, unspecified: Secondary | ICD-10-CM

## 2013-08-14 LAB — COMPREHENSIVE METABOLIC PANEL (CC13)
ALK PHOS: 68 U/L (ref 40–150)
ALT: 29 U/L (ref 0–55)
AST: 36 U/L — ABNORMAL HIGH (ref 5–34)
Albumin: 4.2 g/dL (ref 3.5–5.0)
Anion Gap: 10 mEq/L (ref 3–11)
BILIRUBIN TOTAL: 0.61 mg/dL (ref 0.20–1.20)
BUN: 15.3 mg/dL (ref 7.0–26.0)
CO2: 27 mEq/L (ref 22–29)
Calcium: 9.9 mg/dL (ref 8.4–10.4)
Chloride: 107 mEq/L (ref 98–109)
Creatinine: 1 mg/dL (ref 0.6–1.1)
GLUCOSE: 91 mg/dL (ref 70–140)
Potassium: 4.1 mEq/L (ref 3.5–5.1)
SODIUM: 144 meq/L (ref 136–145)
TOTAL PROTEIN: 6.6 g/dL (ref 6.4–8.3)

## 2013-08-14 LAB — CBC WITH DIFFERENTIAL/PLATELET
BASO%: 0.5 % (ref 0.0–2.0)
BASOS ABS: 0 10*3/uL (ref 0.0–0.1)
EOS ABS: 0 10*3/uL (ref 0.0–0.5)
EOS%: 0.5 % (ref 0.0–7.0)
HCT: 32.8 % — ABNORMAL LOW (ref 34.8–46.6)
HEMOGLOBIN: 10.3 g/dL — AB (ref 11.6–15.9)
LYMPH%: 50.7 % — AB (ref 14.0–49.7)
MCH: 28.8 pg (ref 25.1–34.0)
MCHC: 31.4 g/dL — AB (ref 31.5–36.0)
MCV: 91.6 fL (ref 79.5–101.0)
MONO#: 0.1 10*3/uL (ref 0.1–0.9)
MONO%: 2.8 % (ref 0.0–14.0)
NEUT%: 45.5 % (ref 38.4–76.8)
NEUTROS ABS: 1 10*3/uL — AB (ref 1.5–6.5)
Platelets: 101 10*3/uL — ABNORMAL LOW (ref 145–400)
RBC: 3.58 10*6/uL — ABNORMAL LOW (ref 3.70–5.45)
RDW: 16.6 % — ABNORMAL HIGH (ref 11.2–14.5)
WBC: 2.1 10*3/uL — AB (ref 3.9–10.3)
lymph#: 1.1 10*3/uL (ref 0.9–3.3)
nRBC: 0 % (ref 0–0)

## 2013-08-14 MED ORDER — FUROSEMIDE 20 MG PO TABS: 20.0000 mg | ORAL_TABLET | Freq: Every day | ORAL | Status: DC

## 2013-08-14 MED ORDER — METHOTREXATE 2.5 MG PO TABS: 2.5000 mg | ORAL_TABLET | ORAL | Status: DC

## 2013-08-14 NOTE — Progress Notes (Signed)
[place] OFFICE PROGRESS NOTE  Patient Care Team: Madelyn Brunner, MD as PCP - General (Unknown Physician Specialty) Heath Lark, MD as Consulting Physician (Hematology and Oncology)  DIAGNOSIS: LGL leukemia  SUMMARY OF ONCOLOGIC HISTORY: She was found to have anemia and was placed on observation. The cause of the anemia was thought to be related to chronic kidney disease. The patient also has history of Roux-en-Y gastric bypass and it was thought to be the contributing factor. 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 On 04/09/2013, she was started on prednisone 60 mg daily and methotrexate 7.5 mg once a week by mouth On 04/16/2013, she developed acute renal failure with hyperkalemia. Prednisone dose was reduced to 40 mg a day and methotrexate was placed on hold On 04/23/2013, acute renal failure was improving. The prednisone dose was reduced to 20 mg a day and would resume methotrexate at 7.5 mg once a week. On 04/27/2013, I reduced methotrexate to 5 mg once a week and prednisone dose was reduced to 10 mg alternate with 20 mg On 05/04/2013, I order one unit of blood transfusion. I The prednisone dose the same but reduced methotrexate to 2.5 mg once a week On 05/11/2013, I started to reduce prednisone dose further to 10 mg daily but to keep methotrexate at 2.5 mg once a week On 05/25/2013, I further reduce prednisone to 10 mg alternate with 7.5 mg and keep methotrexate at 2.5 mg once a week On 06/09/2013, and I further reduce prednisone to 5 mg daily and keep methotrexate at 2.5 mg once a week  INTERVAL HISTORY: Stacey Cooley 73 y.o. female returns for further followup. The patient have chronic drenching night sweats. She still had persistent headache on a regular basis. She said her persistent upper respiratory tract congestion with cough. She denies any fevers or chills. Appetite is  stable. The patient denies any recent signs or symptoms of bleeding such as spontaneous epistaxis, hematuria or hematochezia.  I have reviewed the past medical history, past surgical history, social history and family history with the patient and they are unchanged from previous note.  ALLERGIES:  is allergic to latex; penicillins; and sulfonamide derivatives.  MEDICATIONS:  Current Outpatient Prescriptions  Medication Sig Dispense Refill  . acetaminophen-codeine (TYLENOL #3) 300-30 MG per tablet Take 1 tablet by mouth 4 (four) times daily as needed for pain.       Marland Kitchen allopurinol (ZYLOPRIM) 300 MG tablet Take 300 mg by mouth every morning.       . butalbital-acetaminophen-caffeine (FIORICET, ESGIC) 50-325-40 MG per tablet Take 1 tablet by mouth every 6 (six) hours as needed for headache.  90 tablet  0  . Calcium Carbonate-Vitamin D (CALCIUM 600+D) 600-400 MG-UNIT per tablet Take 1 tablet by mouth daily.      . cholecalciferol (VITAMIN D) 1000 UNITS tablet Take 1,000 Units by mouth 2 (two) times daily.      . citalopram (CELEXA) 20 MG tablet Take 20 mg by mouth every morning.       . Cyanocobalamin (VITAMIN B-12 SL) Place 1 tablet under the tongue daily.      . cyclobenzaprine (FLEXERIL) 5 MG tablet Take 1 tablet by mouth 2 (two) times daily.      . fluticasone (FLONASE) 50 MCG/ACT nasal spray Place 3 sprays into the nose daily.      Marland Kitchen levothyroxine (SYNTHROID, LEVOTHROID) 75 MCG tablet Take 75 mcg by mouth daily before  breakfast.       . methotrexate (RHEUMATREX) 2.5 MG tablet Take 1 tablet (2.5 mg total) by mouth once a week. Caution:Chemotherapy. Protect from light.  30 tablet  0  . metoprolol tartrate (LOPRESSOR) 25 MG tablet Take 25 mg by mouth 2 (two) times daily.        . Multiple Vitamin (MULTIVITAMIN WITH MINERALS) TABS Take 1 tablet by mouth daily. Centrum      . ondansetron (ZOFRAN) 8 MG tablet Take 1 tablet (8 mg total) by mouth every 8 (eight) hours as needed for nausea.  30 tablet   3  . oxybutynin (DITROPAN) 5 MG tablet Take 5 mg by mouth Daily.      . predniSONE (DELTASONE) 20 MG tablet Take 5 mg by mouth daily with breakfast. Alternate with 10 mg every other day per Dr. Alvy Bimler.      . predniSONE (DELTASONE) 5 MG tablet Take 1 tablet (5 mg total) by mouth daily with breakfast.  60 tablet  1  . furosemide (LASIX) 20 MG tablet Take 1 tablet (20 mg total) by mouth daily.  60 tablet  0   No current facility-administered medications for this visit.    REVIEW OF SYSTEMS:   Constitutional: Denies fevers, chills or abnormal weight loss Eyes: Denies blurriness of vision Ears, nose, mouth, throat, and face: Denies mucositis or sore throat Cardiovascular: Denies palpitation, chest discomfort or lower extremity swelling Gastrointestinal:  Denies nausea, heartburn or change in bowel habits Skin: Denies abnormal skin rashes Lymphatics: Denies new lymphadenopathy or easy bruising Neurological:Denies numbness, tingling or new weaknesses Behavioral/Psych: Mood is stable, no new changes  All other systems were reviewed with the patient and are negative.  PHYSICAL EXAMINATION: ECOG PERFORMANCE STATUS: 1 - Symptomatic but completely ambulatory  Filed Vitals:   08/14/13 0818  BP: 127/61  Pulse: 54  Temp: 97.6 F (36.4 C)  Resp: 18   Filed Weights   08/14/13 0818  Weight: 223 lb 1.6 oz (101.197 kg)    GENERAL:alert, no distress and comfortable. She is morbidly obese SKIN: skin color, texture, turgor are normal, no rashes or significant lesions EYES: normal, Conjunctiva are pink and non-injected, sclera clear OROPHARYNX:no exudate, no erythema and lips, buccal mucosa, and tongue normal  NECK: supple, thyroid normal size, non-tender, without nodularity LYMPH:  no palpable lymphadenopathy in the cervical, axillary or inguinal LUNGS: clear to auscultation and percussion with normal breathing effort HEART: regular rate & rhythm and no murmurs with moderate bilateral lower  extremity edema ABDOMEN:abdomen soft, non-tender and normal bowel sounds Musculoskeletal:no cyanosis of digits and no clubbing  NEURO: alert & oriented x 3 with fluent speech, no focal motor/sensory deficits  LABORATORY DATA:  I have reviewed the data as listed    Component Value Date/Time   NA 144 08/14/2013 0808   NA 144 08/02/2012 1500   K 4.1 08/14/2013 0808   K 4.0 08/02/2012 1500   CL 105 10/25/2012 1135   CL 103 08/02/2012 1500   CO2 27 08/14/2013 0808   CO2 28 08/02/2012 1500   GLUCOSE 91 08/14/2013 0808   GLUCOSE 85 10/25/2012 1135   GLUCOSE 76 08/02/2012 1500   BUN 15.3 08/14/2013 0808   BUN 15 08/02/2012 1500   CREATININE 1.0 08/14/2013 0808   CREATININE 0.94 08/02/2012 1500   CREATININE 1.22* 09/08/2011 1120   CALCIUM 9.9 08/14/2013 0808   CALCIUM 10.3 08/02/2012 1500   PROT 6.6 08/14/2013 0808   PROT 7.9 08/02/2012 1500   ALBUMIN 4.2 08/14/2013  4132   ALBUMIN 4.1 08/02/2012 1500   AST 36* 08/14/2013 0808   AST 29 08/02/2012 1500   ALT 29 08/14/2013 0808   ALT 38* 08/02/2012 1500   ALKPHOS 68 08/14/2013 0808   ALKPHOS 151* 08/02/2012 1500   BILITOT 0.61 08/14/2013 0808   BILITOT 0.3 08/02/2012 1500   GFRNONAA 61* 08/02/2012 1500   GFRAA 70* 08/02/2012 1500    No results found for this basename: SPEP,  UPEP,   kappa and lambda light chains    Lab Results  Component Value Date   WBC 2.1* 08/14/2013   NEUTROABS 1.0* 08/14/2013   HGB 10.3* 08/14/2013   HCT 32.8* 08/14/2013   MCV 91.6 08/14/2013   PLT 101* 08/14/2013      Chemistry      Component Value Date/Time   NA 144 08/14/2013 0808   NA 144 08/02/2012 1500   K 4.1 08/14/2013 0808   K 4.0 08/02/2012 1500   CL 105 10/25/2012 1135   CL 103 08/02/2012 1500   CO2 27 08/14/2013 0808   CO2 28 08/02/2012 1500   BUN 15.3 08/14/2013 0808   BUN 15 08/02/2012 1500   CREATININE 1.0 08/14/2013 0808   CREATININE 0.94 08/02/2012 1500   CREATININE 1.22* 09/08/2011 1120      Component Value Date/Time   CALCIUM 9.9 08/14/2013 0808   CALCIUM 10.3  08/02/2012 1500   ALKPHOS 68 08/14/2013 0808   ALKPHOS 151* 08/02/2012 1500   AST 36* 08/14/2013 0808   AST 29 08/02/2012 1500   ALT 29 08/14/2013 0808   ALT 38* 08/02/2012 1500   BILITOT 0.61 08/14/2013 0808   BILITOT 0.3 08/02/2012 1500     ASSESSMENT & PLAN:  #1 LGL leukemia #2 anemia #3 mild thrombocytopenia #4 mild leukopenia The patient still has persistent disease. Overall her blood work is worse.  Her blood work it is worse coinciding with the time I start tapering down her medications. I recommend increasing the dose of methotrexate to 5 mg once a week. The patient will need to come back here on the weekly basis for blood work. I will not taper her prednisone dose further and keep it to 5 mg daily. I plan to see the patient every other week to monitor her blood counts carefully. There will be no indication to perform a bone marrow biopsy at this point because it is clear to me that her disease is progressed. At present time, I recommend she increase methotrexate to 5 mg weekly and to continue prednisone at 5 mg daily #5 hot flashes/sweats I suspect this is related to disease. I recommend conservative management #6 moderate lower extremity edema I have told the patient to resume taking her Lasix 20 mg every other day and monitor her weight carefully.  Orders Placed This Encounter  Procedures  . CBC with Differential    Standing Status: Standing     Number of Occurrences: 9     Standing Expiration Date: 08/15/2014  . Comprehensive metabolic panel    Standing Status: Future     Number of Occurrences:      Standing Expiration Date: 08/14/2014   All questions were answered. The patient knows to call the clinic with any problems, questions or concerns. No barriers to learning was detected. I spent 25 minutes counseling the patient face to face. The total time spent in the appointment was 40 minutes and more than 50% was on counseling and review of test results     [place], Kaeden Depaz,  MD 08/14/2013 5:01 PM

## 2013-08-14 NOTE — Telephone Encounter (Signed)
gv and printed appt sched and avs for pt for March and April °

## 2013-08-15 ENCOUNTER — Other Ambulatory Visit: Payer: Self-pay | Admitting: *Deleted

## 2013-08-15 DIAGNOSIS — R609 Edema, unspecified: Secondary | ICD-10-CM

## 2013-08-15 DIAGNOSIS — C91Z0 Other lymphoid leukemia not having achieved remission: Secondary | ICD-10-CM

## 2013-08-15 MED ORDER — METHOTREXATE 2.5 MG PO TABS: 5.0000 mg | ORAL_TABLET | ORAL | Status: DC

## 2013-08-20 ENCOUNTER — Other Ambulatory Visit (HOSPITAL_BASED_OUTPATIENT_CLINIC_OR_DEPARTMENT_OTHER): Payer: Medicare Other

## 2013-08-20 ENCOUNTER — Other Ambulatory Visit: Payer: Self-pay | Admitting: Hematology and Oncology

## 2013-08-20 ENCOUNTER — Telehealth: Payer: Self-pay | Admitting: *Deleted

## 2013-08-20 DIAGNOSIS — C91Z0 Other lymphoid leukemia not having achieved remission: Secondary | ICD-10-CM

## 2013-08-20 DIAGNOSIS — D649 Anemia, unspecified: Secondary | ICD-10-CM

## 2013-08-20 DIAGNOSIS — D61818 Other pancytopenia: Secondary | ICD-10-CM

## 2013-08-20 DIAGNOSIS — R609 Edema, unspecified: Secondary | ICD-10-CM

## 2013-08-20 LAB — CBC WITH DIFFERENTIAL/PLATELET
BASO%: 0.5 % (ref 0.0–2.0)
BASOS ABS: 0 10*3/uL (ref 0.0–0.1)
EOS ABS: 0 10*3/uL (ref 0.0–0.5)
EOS%: 1 % (ref 0.0–7.0)
HCT: 31.8 % — ABNORMAL LOW (ref 34.8–46.6)
HEMOGLOBIN: 10 g/dL — AB (ref 11.6–15.9)
LYMPH%: 68.2 % — ABNORMAL HIGH (ref 14.0–49.7)
MCH: 28.4 pg (ref 25.1–34.0)
MCHC: 31.4 g/dL — ABNORMAL LOW (ref 31.5–36.0)
MCV: 90.3 fL (ref 79.5–101.0)
MONO#: 0.1 10*3/uL (ref 0.1–0.9)
MONO%: 5.2 % (ref 0.0–14.0)
NEUT%: 25.1 % — ABNORMAL LOW (ref 38.4–76.8)
PLATELETS: 102 10*3/uL — AB (ref 145–400)
RBC: 3.52 10*6/uL — AB (ref 3.70–5.45)
RDW: 16.6 % — AB (ref 11.2–14.5)
WBC: 1.9 10*3/uL — AB (ref 3.9–10.3)
lymph#: 1.3 10*3/uL (ref 0.9–3.3)
nRBC: 0 % (ref 0–0)

## 2013-08-20 MED ORDER — CIPROFLOXACIN HCL 500 MG PO TABS: 500.0000 mg | ORAL_TABLET | Freq: Two times a day (BID) | ORAL | Status: DC

## 2013-08-20 NOTE — Telephone Encounter (Signed)
Pt in lobby asking about her lab results.  Dr. Alvy Bimler brought pt back to exam room and discussed results w/ pt.Marland Kitchen

## 2013-08-20 NOTE — Progress Notes (Signed)
I reviewed the test results with the patient and her husband briefly. The patient has progression of the leukopenia and neutropenia. I went ahead and prescribed prophylactic antibiotics with ciprofloxacin. If the patient is feeling unwell for signs of infection, she can proceed and take ciprofloxacin. I recommend we continue on methotrexate weekly and see her next week for further review.

## 2013-08-27 ENCOUNTER — Telehealth: Payer: Self-pay | Admitting: Hematology and Oncology

## 2013-08-27 ENCOUNTER — Ambulatory Visit (HOSPITAL_BASED_OUTPATIENT_CLINIC_OR_DEPARTMENT_OTHER): Payer: Medicare Other | Admitting: Hematology and Oncology

## 2013-08-27 ENCOUNTER — Other Ambulatory Visit (HOSPITAL_BASED_OUTPATIENT_CLINIC_OR_DEPARTMENT_OTHER): Payer: Medicare Other

## 2013-08-27 VITALS — BP 120/67 | HR 59 | Temp 98.3°F | Resp 20 | Ht 62.0 in | Wt 217.1 lb

## 2013-08-27 DIAGNOSIS — R609 Edema, unspecified: Secondary | ICD-10-CM

## 2013-08-27 DIAGNOSIS — D649 Anemia, unspecified: Secondary | ICD-10-CM

## 2013-08-27 DIAGNOSIS — N959 Unspecified menopausal and perimenopausal disorder: Secondary | ICD-10-CM

## 2013-08-27 DIAGNOSIS — C91Z0 Other lymphoid leukemia not having achieved remission: Secondary | ICD-10-CM

## 2013-08-27 DIAGNOSIS — R197 Diarrhea, unspecified: Secondary | ICD-10-CM

## 2013-08-27 DIAGNOSIS — D72819 Decreased white blood cell count, unspecified: Secondary | ICD-10-CM

## 2013-08-27 DIAGNOSIS — D696 Thrombocytopenia, unspecified: Secondary | ICD-10-CM

## 2013-08-27 LAB — CBC WITH DIFFERENTIAL/PLATELET
BASO%: 0.6 % (ref 0.0–2.0)
BASOS ABS: 0 10*3/uL (ref 0.0–0.1)
EOS ABS: 0 10*3/uL (ref 0.0–0.5)
EOS%: 1.1 % (ref 0.0–7.0)
HEMATOCRIT: 32 % — AB (ref 34.8–46.6)
HEMOGLOBIN: 10.2 g/dL — AB (ref 11.6–15.9)
LYMPH#: 1 10*3/uL (ref 0.9–3.3)
LYMPH%: 57 % — ABNORMAL HIGH (ref 14.0–49.7)
MCH: 28.8 pg (ref 25.1–34.0)
MCHC: 31.9 g/dL (ref 31.5–36.0)
MCV: 90.4 fL (ref 79.5–101.0)
MONO#: 0.2 10*3/uL (ref 0.1–0.9)
MONO%: 9.5 % (ref 0.0–14.0)
NEUT#: 0.6 10*3/uL — ABNORMAL LOW (ref 1.5–6.5)
NEUT%: 31.8 % — AB (ref 38.4–76.8)
Platelets: 92 10*3/uL — ABNORMAL LOW (ref 145–400)
RBC: 3.54 10*6/uL — ABNORMAL LOW (ref 3.70–5.45)
RDW: 16.2 % — ABNORMAL HIGH (ref 11.2–14.5)
WBC: 1.8 10*3/uL — AB (ref 3.9–10.3)

## 2013-08-27 LAB — COMPREHENSIVE METABOLIC PANEL (CC13)
ALT: 21 U/L (ref 0–55)
ANION GAP: 10 meq/L (ref 3–11)
AST: 31 U/L (ref 5–34)
Albumin: 3.9 g/dL (ref 3.5–5.0)
Alkaline Phosphatase: 74 U/L (ref 40–150)
BUN: 17.6 mg/dL (ref 7.0–26.0)
CHLORIDE: 104 meq/L (ref 98–109)
CO2: 26 mEq/L (ref 22–29)
CREATININE: 1.2 mg/dL — AB (ref 0.6–1.1)
Calcium: 9.1 mg/dL (ref 8.4–10.4)
GLUCOSE: 106 mg/dL (ref 70–140)
Potassium: 3.6 mEq/L (ref 3.5–5.1)
Sodium: 141 mEq/L (ref 136–145)
Total Bilirubin: 0.53 mg/dL (ref 0.20–1.20)
Total Protein: 6.2 g/dL — ABNORMAL LOW (ref 6.4–8.3)

## 2013-08-27 NOTE — Telephone Encounter (Signed)
Gave pt appt for lab and MD  for April 2015 °

## 2013-08-27 NOTE — Progress Notes (Signed)
[name]  Patient Care Team: Madelyn Brunner, MD as PCP - General (Unknown Physician Specialty) Heath Lark, MD as Consulting Physician (Hematology and Oncology)  DIAGNOSIS: LGL leukemia  SUMMARY OF ONCOLOGIC HISTORY: She was found to have anemia and was placed on observation. The cause of the anemia was thought to be related to chronic kidney disease. The patient also has history of Roux-en-Y gastric bypass and it was thought to be the contributing factor. 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 On 04/09/2013, she was started on prednisone 60 mg daily and methotrexate 7.5 mg once a week by mouth On 04/16/2013, she developed acute renal failure with hyperkalemia. Prednisone dose was reduced to 40 mg a day and methotrexate was placed on hold On 04/23/2013, acute renal failure was improving. The prednisone dose was reduced to 20 mg a day and would resume methotrexate at 7.5 mg once a week. On 04/27/2013, I reduced methotrexate to 5 mg once a week and prednisone dose was reduced to 10 mg alternate with 20 mg On 05/04/2013, I order one unit of blood transfusion. I The prednisone dose the same but reduced methotrexate to 2.5 mg once a week On 05/11/2013, I started to reduce prednisone dose further to 10 mg daily but to keep methotrexate at 2.5 mg once a week On 05/25/2013, I further reduce prednisone to 10 mg alternate with 7.5 mg and keep methotrexate at 2.5 mg once a week On 06/09/2013, and I further reduce prednisone to 5 mg daily and keep methotrexate at 2.5 mg once a week On 08/14/2013, I increase the methotrexate to 5 mg once a week and Prednisone at 5 mg daily.  INTERVAL HISTORY: Stacey Cooley 73 y.o. female returns for further followup. This morning, she developed diarrhea with the passage of 5 soft bowel movement. She started taking ciprofloxacin. She still has  occasional headache but it is improving. The sinus congestion is the same. I have reviewed the past medical history, past surgical history, social history and family history with the patient and they are unchanged from previous note.  ALLERGIES:  is allergic to latex; penicillins; and sulfonamide derivatives.  MEDICATIONS:  Current Outpatient Prescriptions  Medication Sig Dispense Refill  . acetaminophen-codeine (TYLENOL #3) 300-30 MG per tablet Take 1 tablet by mouth 4 (four) times daily as needed for pain.       Marland Kitchen allopurinol (ZYLOPRIM) 300 MG tablet Take 300 mg by mouth every morning.       . butalbital-acetaminophen-caffeine (FIORICET, ESGIC) 50-325-40 MG per tablet Take 1 tablet by mouth every 6 (six) hours as needed for headache.  90 tablet  0  . Calcium Carbonate-Vitamin D (CALCIUM 600+D) 600-400 MG-UNIT per tablet Take 1 tablet by mouth daily.      . cholecalciferol (VITAMIN D) 1000 UNITS tablet Take 1,000 Units by mouth 2 (two) times daily.      . ciprofloxacin (CIPRO) 500 MG tablet Take 1 tablet (500 mg total) by mouth 2 (two) times daily.  14 tablet  0  . citalopram (CELEXA) 20 MG tablet Take 20 mg by mouth every morning.       . Cyanocobalamin (VITAMIN B-12 SL) Place 1 tablet under the tongue daily.      . fluticasone (FLONASE) 50 MCG/ACT nasal spray Place 3 sprays into the nose daily.      . furosemide (LASIX) 20 MG tablet Take 20 mg by mouth. Taking  M, W, F.  Only.      . levothyroxine (SYNTHROID, LEVOTHROID) 75 MCG tablet Take 75 mcg by mouth daily before breakfast.       . methotrexate (RHEUMATREX) 2.5 MG tablet Take 2 tablets (5 mg total) by mouth once a week. Caution:Chemotherapy. Protect from light.  30 tablet  0  . metoprolol tartrate (LOPRESSOR) 25 MG tablet Take 25 mg by mouth 2 (two) times daily.        . Multiple Vitamin (MULTIVITAMIN WITH MINERALS) TABS Take 1 tablet by mouth daily. Centrum      . ondansetron (ZOFRAN) 8 MG tablet Take 1 tablet (8 mg total) by mouth  every 8 (eight) hours as needed for nausea.  30 tablet  3  . oxybutynin (DITROPAN) 5 MG tablet Take 5 mg by mouth Daily.      . predniSONE (DELTASONE) 20 MG tablet Take 5 mg by mouth daily with breakfast. Alternate with 10 mg every other day per Dr. .      . predniSONE (DELTASONE) 5 MG tablet Take 1 tablet (5 mg total) by mouth daily with breakfast.  60 tablet  1  . cyclobenzaprine (FLEXERIL) 5 MG tablet Take 1 tablet by mouth 2 (two) times daily.       No current facility-administered medications for this visit.    REVIEW OF SYSTEMS:   Constitutional: Denies fevers, chills Eyes: Denies blurriness of vision Ears, nose, mouth, throat, and face: Denies mucositis or sore throat Respiratory: Denies cough, dyspnea or wheezes Cardiovascular: Denies palpitation, chest discomfort or lower extremity swelling Gastrointestinal:  Denies nausea, heartburn  Skin: Denies abnormal skin rashes Lymphatics: Denies new lymphadenopathy or easy bruising Neurological:Denies numbness, tingling or new weaknesses Behavioral/Psych: Mood is stable, no new changes  All other systems were reviewed with the patient and are negative.  PHYSICAL EXAMINATION: ECOG PERFORMANCE STATUS: 1 - Symptomatic but completely ambulatory  Filed Vitals:   08/27/13 0938  BP: 120/67  Pulse: 59  Temp: 98.3 F (36.8 C)  Resp: 20   Filed Weights   08/27/13 0938  Weight: 217 lb 1.6 oz (98.476 kg)    GENERAL:alert, no distress and comfortable. She is morbidly obese SKIN: skin color, texture, turgor are normal, no rashes or significant lesions EYES: normal, Conjunctiva are pink and non-injected, sclera clear OROPHARYNX:no exudate, no erythema and lips, buccal mucosa, and tongue normal. No thrush  NECK: supple, thyroid normal size, non-tender, without nodularity LYMPH:  no palpable lymphadenopathy in the cervical, axillary or inguinal LUNGS: clear to auscultation and percussion with normal breathing effort HEART: regular  rate & rhythm and no murmurs and no lower extremity edema ABDOMEN:abdomen soft, non-tender and normal bowel sounds Musculoskeletal:no cyanosis of digits and no clubbing  NEURO: alert & oriented x 3 with fluent speech, no focal motor/sensory deficits  LABORATORY DATA:  I have reviewed the data as listed    Component Value Date/Time   NA 141 08/27/2013 0923   NA 144 08/02/2012 1500   K 3.6 08/27/2013 0923   K 4.0 08/02/2012 1500   CL 105 10/25/2012 1135   CL 103 08/02/2012 1500   CO2 26 08/27/2013 0923   CO2 28 08/02/2012 1500   GLUCOSE 106 08/27/2013 0923   GLUCOSE 85 10/25/2012 1135   GLUCOSE 76 08/02/2012 1500   BUN 17.6 08/27/2013 0923   BUN 15 08/02/2012 1500   CREATININE 1.2* 08/27/2013 0923   CREATININE 0.94 08/02/2012 1500   CREATININE 1.22* 09/08/2011 1120   CALCIUM 9.1 08/27/2013 0923     CALCIUM 10.3 08/02/2012 1500   PROT 6.2* 08/27/2013 0923   PROT 7.9 08/02/2012 1500   ALBUMIN 3.9 08/27/2013 0923   ALBUMIN 4.1 08/02/2012 1500   AST 31 08/27/2013 0923   AST 29 08/02/2012 1500   ALT 21 08/27/2013 0923   ALT 38* 08/02/2012 1500   ALKPHOS 74 08/27/2013 0923   ALKPHOS 151* 08/02/2012 1500   BILITOT 0.53 08/27/2013 0923   BILITOT 0.3 08/02/2012 1500   GFRNONAA 61* 08/02/2012 1500   GFRAA 70* 08/02/2012 1500    No results found for this basename: SPEP,  UPEP,   kappa and lambda light chains    Lab Results  Component Value Date   WBC 1.8* 08/27/2013   NEUTROABS 0.6* 08/27/2013   HGB 10.2* 08/27/2013   HCT 32.0* 08/27/2013   MCV 90.4 08/27/2013   PLT 92* 08/27/2013      Chemistry      Component Value Date/Time   NA 141 08/27/2013 0923   NA 144 08/02/2012 1500   K 3.6 08/27/2013 0923   K 4.0 08/02/2012 1500   CL 105 10/25/2012 1135   CL 103 08/02/2012 1500   CO2 26 08/27/2013 0923   CO2 28 08/02/2012 1500   BUN 17.6 08/27/2013 0923   BUN 15 08/02/2012 1500   CREATININE 1.2* 08/27/2013 0923   CREATININE 0.94 08/02/2012 1500   CREATININE 1.22* 09/08/2011 1120      Component Value Date/Time   CALCIUM 9.1 08/27/2013 0923    CALCIUM 10.3 08/02/2012 1500   ALKPHOS 74 08/27/2013 0923   ALKPHOS 151* 08/02/2012 1500   AST 31 08/27/2013 0923   AST 29 08/02/2012 1500   ALT 21 08/27/2013 0923   ALT 38* 08/02/2012 1500   BILITOT 0.53 08/27/2013 0923   BILITOT 0.3 08/02/2012 1500     ASSESSMENT & PLAN:  #1 LGL leukemia #2 anemia #3 mild thrombocytopenia #4 leukopenia The patient still has persistent disease. Overall her blood work is slightly improving with less lymphocytosis although she has severe leukopenia with neutropenia.  Her blood work it is worse coinciding with the time I start tapering down her medications. I recommend increasing the dose of methotrexate to 5 mg once a week. The patient will need to come back here on the weekly basis for blood work. I will not taper her prednisone dose further and keep it to 5 mg daily. I plan to see the patient every other week to monitor her blood counts carefully. There will be no indication to perform a bone marrow biopsy at this point because it is clear to me that her disease is progressed. At present time, I recommend she increase methotrexate to 5 mg weekly and to continue prednisone at 5 mg daily #5 hot flashes/sweats I suspect this is related to disease. I recommend conservative management #6 moderate lower extremity edema I have told the patient to resume taking her Lasix 20 mg every other day and monitor her weight carefully. #7 diarrhea Cause is unknown. I recommend she completed a course of ciprofloxacin in case she had some infection as the cause of the diarrhea. I recommend she drink plenty of fluids to prevent risk of dehydration.  Orders Placed This Encounter  Procedures  . Comprehensive metabolic panel    Standing Status: Future     Number of Occurrences:      Standing Expiration Date: 08/27/2014   All questions were answered. The patient knows to call the clinic with any problems, questions or concerns. No barriers to   learning was detected.   , ,  MD 08/27/2013 11:35 AM    

## 2013-09-03 ENCOUNTER — Encounter: Payer: Self-pay | Admitting: *Deleted

## 2013-09-03 ENCOUNTER — Other Ambulatory Visit (HOSPITAL_BASED_OUTPATIENT_CLINIC_OR_DEPARTMENT_OTHER): Payer: Medicare Other

## 2013-09-03 DIAGNOSIS — C91Z0 Other lymphoid leukemia not having achieved remission: Secondary | ICD-10-CM

## 2013-09-03 DIAGNOSIS — R609 Edema, unspecified: Secondary | ICD-10-CM

## 2013-09-03 LAB — CBC WITH DIFFERENTIAL/PLATELET
BASO%: 1 % (ref 0.0–2.0)
BASOS ABS: 0 10*3/uL (ref 0.0–0.1)
EOS%: 1.4 % (ref 0.0–7.0)
Eosinophils Absolute: 0 10*3/uL (ref 0.0–0.5)
HCT: 32.2 % — ABNORMAL LOW (ref 34.8–46.6)
HEMOGLOBIN: 10.2 g/dL — AB (ref 11.6–15.9)
LYMPH#: 1 10*3/uL (ref 0.9–3.3)
LYMPH%: 50.3 % — ABNORMAL HIGH (ref 14.0–49.7)
MCH: 28.5 pg (ref 25.1–34.0)
MCHC: 31.9 g/dL (ref 31.5–36.0)
MCV: 89.5 fL (ref 79.5–101.0)
MONO#: 0.1 10*3/uL (ref 0.1–0.9)
MONO%: 7.2 % (ref 0.0–14.0)
NEUT%: 40.1 % (ref 38.4–76.8)
NEUTROS ABS: 0.8 10*3/uL — AB (ref 1.5–6.5)
Platelets: 107 10*3/uL — ABNORMAL LOW (ref 145–400)
RBC: 3.59 10*6/uL — ABNORMAL LOW (ref 3.70–5.45)
RDW: 17.4 % — AB (ref 11.2–14.5)
WBC: 1.9 10*3/uL — ABNORMAL LOW (ref 3.9–10.3)

## 2013-09-03 NOTE — Progress Notes (Unsigned)
Dr. Alvy Bimler reviewed CBC and instructs for pt to continue same dose of MTX.  No change in therapy.  Informed pt and husband.  They verbalized understanding.

## 2013-09-10 ENCOUNTER — Telehealth: Payer: Self-pay | Admitting: Hematology and Oncology

## 2013-09-10 ENCOUNTER — Encounter (INDEPENDENT_AMBULATORY_CARE_PROVIDER_SITE_OTHER): Payer: Self-pay

## 2013-09-10 ENCOUNTER — Encounter: Payer: Self-pay | Admitting: Hematology and Oncology

## 2013-09-10 ENCOUNTER — Other Ambulatory Visit (HOSPITAL_BASED_OUTPATIENT_CLINIC_OR_DEPARTMENT_OTHER): Payer: Medicare Other

## 2013-09-10 ENCOUNTER — Ambulatory Visit (HOSPITAL_BASED_OUTPATIENT_CLINIC_OR_DEPARTMENT_OTHER): Payer: Medicare Other | Admitting: Hematology and Oncology

## 2013-09-10 VITALS — BP 117/53 | HR 54 | Temp 98.2°F | Resp 18 | Ht 62.0 in | Wt 224.8 lb

## 2013-09-10 DIAGNOSIS — M949 Disorder of cartilage, unspecified: Secondary | ICD-10-CM

## 2013-09-10 DIAGNOSIS — D72819 Decreased white blood cell count, unspecified: Secondary | ICD-10-CM

## 2013-09-10 DIAGNOSIS — C91Z0 Other lymphoid leukemia not having achieved remission: Secondary | ICD-10-CM

## 2013-09-10 DIAGNOSIS — R232 Flushing: Secondary | ICD-10-CM

## 2013-09-10 DIAGNOSIS — R519 Headache, unspecified: Secondary | ICD-10-CM

## 2013-09-10 DIAGNOSIS — M899 Disorder of bone, unspecified: Secondary | ICD-10-CM

## 2013-09-10 DIAGNOSIS — D649 Anemia, unspecified: Secondary | ICD-10-CM

## 2013-09-10 DIAGNOSIS — R51 Headache: Secondary | ICD-10-CM

## 2013-09-10 DIAGNOSIS — R609 Edema, unspecified: Secondary | ICD-10-CM

## 2013-09-10 LAB — CBC WITH DIFFERENTIAL/PLATELET
BASO%: 0.4 % (ref 0.0–2.0)
Basophils Absolute: 0 10*3/uL (ref 0.0–0.1)
EOS%: 1.7 % (ref 0.0–7.0)
Eosinophils Absolute: 0 10*3/uL (ref 0.0–0.5)
HEMATOCRIT: 30.7 % — AB (ref 34.8–46.6)
HGB: 9.5 g/dL — ABNORMAL LOW (ref 11.6–15.9)
LYMPH#: 1.2 10*3/uL (ref 0.9–3.3)
LYMPH%: 50.6 % — ABNORMAL HIGH (ref 14.0–49.7)
MCH: 28 pg (ref 25.1–34.0)
MCHC: 30.9 g/dL — ABNORMAL LOW (ref 31.5–36.0)
MCV: 90.6 fL (ref 79.5–101.0)
MONO#: 0.2 10*3/uL (ref 0.1–0.9)
MONO%: 6.8 % (ref 0.0–14.0)
NEUT#: 1 10*3/uL — ABNORMAL LOW (ref 1.5–6.5)
NEUT%: 40.5 % (ref 38.4–76.8)
Platelets: 80 10*3/uL — ABNORMAL LOW (ref 145–400)
RBC: 3.39 10*6/uL — ABNORMAL LOW (ref 3.70–5.45)
RDW: 17.4 % — AB (ref 11.2–14.5)
WBC: 2.4 10*3/uL — AB (ref 3.9–10.3)

## 2013-09-10 LAB — COMPREHENSIVE METABOLIC PANEL (CC13)
ALBUMIN: 3.8 g/dL (ref 3.5–5.0)
ALT: 20 U/L (ref 0–55)
AST: 28 U/L (ref 5–34)
Alkaline Phosphatase: 72 U/L (ref 40–150)
Anion Gap: 9 mEq/L (ref 3–11)
BUN: 18.5 mg/dL (ref 7.0–26.0)
CALCIUM: 9.8 mg/dL (ref 8.4–10.4)
CHLORIDE: 107 meq/L (ref 98–109)
CO2: 27 meq/L (ref 22–29)
CREATININE: 1 mg/dL (ref 0.6–1.1)
Glucose: 95 mg/dl (ref 70–140)
POTASSIUM: 3.8 meq/L (ref 3.5–5.1)
Sodium: 143 mEq/L (ref 136–145)
Total Bilirubin: 0.54 mg/dL (ref 0.20–1.20)
Total Protein: 6 g/dL — ABNORMAL LOW (ref 6.4–8.3)

## 2013-09-10 MED ORDER — CIPROFLOXACIN HCL 500 MG PO TABS: 500.0000 mg | ORAL_TABLET | Freq: Two times a day (BID) | ORAL | Status: DC

## 2013-09-10 MED ORDER — BUTALBITAL-APAP-CAFFEINE 50-325-40 MG PO TABS: 1.0000 | ORAL_TABLET | Freq: Four times a day (QID) | ORAL | Status: DC | PRN

## 2013-09-10 MED ORDER — PREDNISONE 5 MG PO TABS: 5.0000 mg | ORAL_TABLET | Freq: Every day | ORAL | Status: DC

## 2013-09-10 NOTE — Telephone Encounter (Signed)
gv and printed appt sched and avs foro ptf or April and May

## 2013-09-10 NOTE — Progress Notes (Signed)
[name] OFFICE PROGRESS NOTE  Patient Care Team: Madelyn Brunner, MD as PCP - General (Unknown Physician Specialty) Heath Lark, MD as Consulting Physician (Hematology and Oncology)  DIAGNOSIS: LGL leukemia  SUMMARY OF ONCOLOGIC HISTORY: She was found to have anemia and was placed on observation. The cause of the anemia was thought to be related to chronic kidney disease. The patient also has history of Roux-en-Y gastric bypass and it was thought to be the contributing factor. 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 On 04/09/2013, she was started on prednisone 60 mg daily and methotrexate 7.5 mg once a week by mouth On 04/16/2013, she developed acute renal failure with hyperkalemia. Prednisone dose was reduced to 40 mg a day and methotrexate was placed on hold On 04/23/2013, acute renal failure was improving. The prednisone dose was reduced to 20 mg a day and would resume methotrexate at 7.5 mg once a week. On 04/27/2013, I reduced methotrexate to 5 mg once a week and prednisone dose was reduced to 10 mg alternate with 20 mg On 05/04/2013, I order one unit of blood transfusion. I The prednisone dose the same but reduced methotrexate to 2.5 mg once a week On 05/11/2013, I started to reduce prednisone dose further to 10 mg daily but to keep methotrexate at 2.5 mg once a week On 05/25/2013, I further reduce prednisone to 10 mg alternate with 7.5 mg and keep methotrexate at 2.5 mg once a week On 06/09/2013, and I further reduce prednisone to 5 mg daily and keep methotrexate at 2.5 mg once a week On 08/14/2013, I increase the methotrexate to 5 mg once a week and Prednisone at 5 mg daily. On 09/10/2013, I reduced prednisone to 5 mg alternate with 2.5 mg and keep methotrexate at 5 mg once a week.  INTERVAL HISTORY: Stacey Cooley 73 y.o. female returns for further followup. She still complaining of  persistent headaches. She still had nasal drainage but they are unchanged. She denies any fevers or chills. She complained of significant leg edema and losing muscle weights.  I have reviewed the past medical history, past surgical history, social history and family history with the patient and they are unchanged from previous note.  ALLERGIES:  is allergic to latex; penicillins; and sulfonamide derivatives.  MEDICATIONS:  Current Outpatient Prescriptions  Medication Sig Dispense Refill  . acetaminophen-codeine (TYLENOL #3) 300-30 MG per tablet Take 1 tablet by mouth 4 (four) times daily as needed for pain.       Marland Kitchen allopurinol (ZYLOPRIM) 300 MG tablet Take 300 mg by mouth every morning.       . butalbital-acetaminophen-caffeine (FIORICET, ESGIC) 50-325-40 MG per tablet Take 1 tablet by mouth every 6 (six) hours as needed for headache.  90 tablet  0  . Calcium Carbonate-Vitamin D (CALCIUM 600+D) 600-400 MG-UNIT per tablet Take 1 tablet by mouth daily.      . cholecalciferol (VITAMIN D) 1000 UNITS tablet Take 1,000 Units by mouth 2 (two) times daily.      . citalopram (CELEXA) 20 MG tablet Take 20 mg by mouth every morning.       . Cyanocobalamin (VITAMIN B-12 SL) Place 1 tablet under the tongue daily.      . fluticasone (FLONASE) 50 MCG/ACT nasal spray Place 3 sprays into the nose daily.      . furosemide (LASIX) 20 MG tablet Take 20 mg by mouth. Taking  M, W, F.  Only.      . levothyroxine (SYNTHROID, LEVOTHROID) 75 MCG tablet Take 75 mcg by mouth daily before breakfast.       . methotrexate (RHEUMATREX) 2.5 MG tablet Take 2 tablets (5 mg total) by mouth once a week. Caution:Chemotherapy. Protect from light.  30 tablet  0  . metoprolol tartrate (LOPRESSOR) 25 MG tablet Take 25 mg by mouth 2 (two) times daily.        . Multiple Vitamin (MULTIVITAMIN WITH MINERALS) TABS Take 1 tablet by mouth daily. Centrum      . oxybutynin (DITROPAN) 5 MG tablet Take 5 mg by mouth Daily.      . predniSONE  (DELTASONE) 5 MG tablet Take 1 tablet (5 mg total) by mouth daily with breakfast.  60 tablet  1  . ciprofloxacin (CIPRO) 500 MG tablet Take 1 tablet (500 mg total) by mouth 2 (two) times daily.  20 tablet  0  . ondansetron (ZOFRAN) 8 MG tablet Take 1 tablet (8 mg total) by mouth every 8 (eight) hours as needed for nausea.  30 tablet  3   No current facility-administered medications for this visit.    REVIEW OF SYSTEMS:   Constitutional: Denies fevers, chills or abnormal weight loss Eyes: Denies blurriness of vision Ears, nose, mouth, throat, and face: Denies mucositis or sore throat. Complained of persistent nasal drainage. Respiratory: Denies cough, dyspnea or wheezes Cardiovascular: Denies palpitation, chest discomfort. She complained of moderate bilateral lower extremity swelling Gastrointestinal:  Denies nausea, heartburn or change in bowel habits Skin: Denies abnormal skin rashes Lymphatics: Denies new lymphadenopathy or easy bruising Neurological:Denies numbness, tingling or new weaknesses Behavioral/Psych: Mood is stable, no new changes  All other systems were reviewed with the patient and are negative.  PHYSICAL EXAMINATION: ECOG PERFORMANCE STATUS: 1 - Symptomatic but completely ambulatory  Filed Vitals:   09/10/13 0838  BP: 117/53  Pulse: 54  Temp: 98.2 F (36.8 C)  Resp: 18   Filed Weights   09/10/13 0838  Weight: 224 lb 12.8 oz (101.969 kg)    GENERAL:alert, no distress and comfortable. She is morbidly obese SKIN: skin color, texture, turgor are normal, no rashes or significant lesions EYES: normal, Conjunctiva are pink and non-injected, sclera clear OROPHARYNX:no exudate, no erythema and lips, buccal mucosa, and tongue normal  NECK: supple, thyroid normal size, non-tender, without nodularity LYMPH:  no palpable lymphadenopathy in the cervical, axillary or inguinal LUNGS: clear to auscultation and percussion with normal breathing effort HEART: regular rate &  rhythm and no murmurs with moderate bilateral lower extremity edema ABDOMEN:abdomen soft, non-tender and normal bowel sounds Musculoskeletal:no cyanosis of digits and no clubbing  NEURO: alert & oriented x 3 with fluent speech, no focal motor/sensory deficits  LABORATORY DATA:  I have reviewed the data as listed    Component Value Date/Time   NA 141 08/27/2013 0923   NA 144 08/02/2012 1500   K 3.6 08/27/2013 0923   K 4.0 08/02/2012 1500   CL 105 10/25/2012 1135   CL 103 08/02/2012 1500   CO2 26 08/27/2013 0923   CO2 28 08/02/2012 1500   GLUCOSE 106 08/27/2013 0923   GLUCOSE 85 10/25/2012 1135   GLUCOSE 76 08/02/2012 1500   BUN 17.6 08/27/2013 0923   BUN 15 08/02/2012 1500   CREATININE 1.2* 08/27/2013 0923   CREATININE 0.94 08/02/2012 1500   CREATININE 1.22* 09/08/2011 1120   CALCIUM 9.1 08/27/2013 0923   CALCIUM 10.3 08/02/2012 1500   PROT 6.2* 08/27/2013 9024  PROT 7.9 08/02/2012 1500   ALBUMIN 3.9 08/27/2013 0923   ALBUMIN 4.1 08/02/2012 1500   AST 31 08/27/2013 0923   AST 29 08/02/2012 1500   ALT 21 08/27/2013 0923   ALT 38* 08/02/2012 1500   ALKPHOS 74 08/27/2013 0923   ALKPHOS 151* 08/02/2012 1500   BILITOT 0.53 08/27/2013 0923   BILITOT 0.3 08/02/2012 1500   GFRNONAA 61* 08/02/2012 1500   GFRAA 70* 08/02/2012 1500    No results found for this basename: SPEP,  UPEP,   kappa and lambda light chains    Lab Results  Component Value Date   WBC 2.4* 09/10/2013   NEUTROABS 1.0* 09/10/2013   HGB 9.5* 09/10/2013   HCT 30.7* 09/10/2013   MCV 90.6 09/10/2013   PLT 80* 09/10/2013      Chemistry      Component Value Date/Time   NA 141 08/27/2013 0923   NA 144 08/02/2012 1500   K 3.6 08/27/2013 0923   K 4.0 08/02/2012 1500   CL 105 10/25/2012 1135   CL 103 08/02/2012 1500   CO2 26 08/27/2013 0923   CO2 28 08/02/2012 1500   BUN 17.6 08/27/2013 0923   BUN 15 08/02/2012 1500   CREATININE 1.2* 08/27/2013 0923   CREATININE 0.94 08/02/2012 1500   CREATININE 1.22* 09/08/2011 1120      Component Value Date/Time   CALCIUM 9.1  08/27/2013 0923   CALCIUM 10.3 08/02/2012 1500   ALKPHOS 74 08/27/2013 0923   ALKPHOS 151* 08/02/2012 1500   AST 31 08/27/2013 0923   AST 29 08/02/2012 1500   ALT 21 08/27/2013 0923   ALT 38* 08/02/2012 1500   BILITOT 0.53 08/27/2013 0923   BILITOT 0.3 08/02/2012 1500     ASSESSMENT & PLAN:  #1 LGL leukemia #2 anemia #3 mild thrombocytopenia #4 leukopenia The patient still has persistent disease. Overall her blood work is slightly improving with less lymphocytosis although she has severe leukopenia with neutropenia. I plan to see the patient every other week and monitor her blood counts carefully. She is tolerating the current dose of methotrexate at 5 mg weekly. Due to diffuse leg edema, I plan to taper prednisone to 2.5 mg alternate with 5 mg daily #5 hot flashes/sweats I suspect this is related to disease. I recommend conservative management #6 moderate lower extremity edema I want her to increase Lasix to 20 mg daily #7 loss of muscle mass This is due to prednisone. As mentioned above, I plan to reduce and stop taper her off her prednisone #8 chronic headaches I reviewed her prescription of Fioricet. The patient think this is related to allergies. She felt Fioricet is helping her cope with the headaches. Orders Placed This Encounter  Procedures  . Comprehensive metabolic panel    Standing Status: Standing     Number of Occurrences: 9     Standing Expiration Date: 09/11/2014   All questions were answered. The patient knows to call the clinic with any problems, questions or concerns. No barriers to learning was detected. I spent 25 minutes counseling the patient face to face. The total time spent in the appointment was 30 minutes and more than 50% was on counseling and review of test results     Heath Lark, MD 09/10/2013 9:06 AM

## 2013-09-19 ENCOUNTER — Encounter: Payer: Self-pay | Admitting: *Deleted

## 2013-09-19 ENCOUNTER — Other Ambulatory Visit (HOSPITAL_BASED_OUTPATIENT_CLINIC_OR_DEPARTMENT_OTHER): Payer: Medicare Other

## 2013-09-19 DIAGNOSIS — D649 Anemia, unspecified: Secondary | ICD-10-CM

## 2013-09-19 DIAGNOSIS — C91Z0 Other lymphoid leukemia not having achieved remission: Secondary | ICD-10-CM

## 2013-09-19 DIAGNOSIS — R519 Headache, unspecified: Secondary | ICD-10-CM

## 2013-09-19 DIAGNOSIS — R51 Headache: Secondary | ICD-10-CM

## 2013-09-19 DIAGNOSIS — R609 Edema, unspecified: Secondary | ICD-10-CM

## 2013-09-19 LAB — CBC WITH DIFFERENTIAL/PLATELET
BASO%: 0.7 % (ref 0.0–2.0)
BASOS ABS: 0 10*3/uL (ref 0.0–0.1)
EOS%: 1.7 % (ref 0.0–7.0)
Eosinophils Absolute: 0 10*3/uL (ref 0.0–0.5)
HCT: 33.3 % — ABNORMAL LOW (ref 34.8–46.6)
HGB: 10.6 g/dL — ABNORMAL LOW (ref 11.6–15.9)
LYMPH%: 55.8 % — ABNORMAL HIGH (ref 14.0–49.7)
MCH: 28.6 pg (ref 25.1–34.0)
MCHC: 31.9 g/dL (ref 31.5–36.0)
MCV: 89.4 fL (ref 79.5–101.0)
MONO#: 0.2 10*3/uL (ref 0.1–0.9)
MONO%: 6 % (ref 0.0–14.0)
NEUT#: 1 10*3/uL — ABNORMAL LOW (ref 1.5–6.5)
NEUT%: 35.8 % — ABNORMAL LOW (ref 38.4–76.8)
Platelets: 103 10*3/uL — ABNORMAL LOW (ref 145–400)
RBC: 3.72 10*6/uL (ref 3.70–5.45)
RDW: 18 % — AB (ref 11.2–14.5)
WBC: 2.7 10*3/uL — ABNORMAL LOW (ref 3.9–10.3)
lymph#: 1.5 10*3/uL (ref 0.9–3.3)

## 2013-09-19 LAB — COMPREHENSIVE METABOLIC PANEL (CC13)
ALBUMIN: 4.1 g/dL (ref 3.5–5.0)
ALK PHOS: 84 U/L (ref 40–150)
ALT: 37 U/L (ref 0–55)
AST: 49 U/L — AB (ref 5–34)
Anion Gap: 12 mEq/L — ABNORMAL HIGH (ref 3–11)
BUN: 21.3 mg/dL (ref 7.0–26.0)
CHLORIDE: 104 meq/L (ref 98–109)
CO2: 28 mEq/L (ref 22–29)
Calcium: 9.7 mg/dL (ref 8.4–10.4)
Creatinine: 1.1 mg/dL (ref 0.6–1.1)
Glucose: 102 mg/dl (ref 70–140)
POTASSIUM: 3.5 meq/L (ref 3.5–5.1)
Sodium: 143 mEq/L (ref 136–145)
Total Bilirubin: 0.67 mg/dL (ref 0.20–1.20)
Total Protein: 6.6 g/dL (ref 6.4–8.3)

## 2013-09-19 NOTE — Progress Notes (Signed)
Dr. Alvy Bimler reviewed pt's CBC and results given to pt in lobby.  Ok to travel and keep next appt on 5/20 as scheduled.  Pt verbalized understanding.

## 2013-10-10 ENCOUNTER — Other Ambulatory Visit (HOSPITAL_BASED_OUTPATIENT_CLINIC_OR_DEPARTMENT_OTHER): Payer: Medicare Other

## 2013-10-10 ENCOUNTER — Other Ambulatory Visit: Payer: Self-pay | Admitting: Hematology and Oncology

## 2013-10-10 ENCOUNTER — Telehealth: Payer: Self-pay | Admitting: Hematology and Oncology

## 2013-10-10 ENCOUNTER — Ambulatory Visit (HOSPITAL_BASED_OUTPATIENT_CLINIC_OR_DEPARTMENT_OTHER): Payer: Medicare Other | Admitting: Hematology and Oncology

## 2013-10-10 VITALS — BP 109/52 | HR 53 | Temp 97.6°F | Resp 20 | Ht 62.0 in | Wt 221.8 lb

## 2013-10-10 DIAGNOSIS — D649 Anemia, unspecified: Secondary | ICD-10-CM

## 2013-10-10 DIAGNOSIS — C91Z0 Other lymphoid leukemia not having achieved remission: Secondary | ICD-10-CM

## 2013-10-10 DIAGNOSIS — R51 Headache: Secondary | ICD-10-CM

## 2013-10-10 DIAGNOSIS — R5381 Other malaise: Secondary | ICD-10-CM

## 2013-10-10 DIAGNOSIS — R519 Headache, unspecified: Secondary | ICD-10-CM

## 2013-10-10 DIAGNOSIS — R609 Edema, unspecified: Secondary | ICD-10-CM

## 2013-10-10 DIAGNOSIS — D696 Thrombocytopenia, unspecified: Secondary | ICD-10-CM

## 2013-10-10 DIAGNOSIS — D61818 Other pancytopenia: Secondary | ICD-10-CM

## 2013-10-10 DIAGNOSIS — R5383 Other fatigue: Secondary | ICD-10-CM

## 2013-10-10 DIAGNOSIS — R232 Flushing: Secondary | ICD-10-CM

## 2013-10-10 LAB — COMPREHENSIVE METABOLIC PANEL (CC13)
ALK PHOS: 75 U/L (ref 40–150)
ALT: 32 U/L (ref 0–55)
AST: 43 U/L — ABNORMAL HIGH (ref 5–34)
Albumin: 4 g/dL (ref 3.5–5.0)
Anion Gap: 12 mEq/L — ABNORMAL HIGH (ref 3–11)
BILIRUBIN TOTAL: 0.85 mg/dL (ref 0.20–1.20)
BUN: 24.5 mg/dL (ref 7.0–26.0)
CO2: 26 mEq/L (ref 22–29)
Calcium: 9.6 mg/dL (ref 8.4–10.4)
Chloride: 105 mEq/L (ref 98–109)
Creatinine: 1 mg/dL (ref 0.6–1.1)
GLUCOSE: 91 mg/dL (ref 70–140)
Potassium: 3.6 mEq/L (ref 3.5–5.1)
Sodium: 144 mEq/L (ref 136–145)
Total Protein: 6.5 g/dL (ref 6.4–8.3)

## 2013-10-10 LAB — CBC WITH DIFFERENTIAL/PLATELET
BASO%: 0.9 % (ref 0.0–2.0)
BASOS ABS: 0 10*3/uL (ref 0.0–0.1)
EOS ABS: 0 10*3/uL (ref 0.0–0.5)
EOS%: 1.7 % (ref 0.0–7.0)
HCT: 32.5 % — ABNORMAL LOW (ref 34.8–46.6)
HEMOGLOBIN: 10.4 g/dL — AB (ref 11.6–15.9)
LYMPH%: 64 % — AB (ref 14.0–49.7)
MCH: 28.9 pg (ref 25.1–34.0)
MCHC: 31.8 g/dL (ref 31.5–36.0)
MCV: 90.6 fL (ref 79.5–101.0)
MONO#: 0.1 10*3/uL (ref 0.1–0.9)
MONO%: 5.1 % (ref 0.0–14.0)
NEUT%: 28.3 % — ABNORMAL LOW (ref 38.4–76.8)
NEUTROS ABS: 0.7 10*3/uL — AB (ref 1.5–6.5)
PLATELETS: 117 10*3/uL — AB (ref 145–400)
RBC: 3.59 10*6/uL — AB (ref 3.70–5.45)
RDW: 19.3 % — ABNORMAL HIGH (ref 11.2–14.5)
WBC: 2.5 10*3/uL — ABNORMAL LOW (ref 3.9–10.3)
lymph#: 1.6 10*3/uL (ref 0.9–3.3)

## 2013-10-10 MED ORDER — FUROSEMIDE 40 MG PO TABS
40.0000 mg | ORAL_TABLET | Freq: Every day | ORAL | Status: DC
Start: 2013-10-10 — End: 2014-02-22

## 2013-10-10 MED ORDER — FOLIC ACID 1 MG PO TABS: 1.0000 mg | ORAL_TABLET | Freq: Every day | ORAL | Status: AC

## 2013-10-10 NOTE — Progress Notes (Signed)
[place] OFFICE PROGRESS NOTE  Patient Care Team: Madelyn Brunner, MD as PCP - General (Unknown Physician Specialty) Heath Lark, MD as Consulting Physician (Hematology and Oncology)  DIAGNOSIS: LGL leukemia with severe pancytopenia  SUMMARY OF ONCOLOGIC HISTORY: She was found to have anemia and was placed on observation. The cause of the anemia was thought to be related to chronic kidney disease. The patient also has history of Roux-en-Y gastric bypass and it was thought to be the contributing factor. 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 On 04/09/2013, she was started on prednisone 60 mg daily and methotrexate 7.5 mg once a week by mouth On 04/16/2013, she developed acute renal failure with hyperkalemia. Prednisone dose was reduced to 40 mg a day and methotrexate was placed on hold On 04/23/2013, acute renal failure was improving. The prednisone dose was reduced to 20 mg a day and would resume methotrexate at 7.5 mg once a week. On 04/27/2013, I reduced methotrexate to 5 mg once a week and prednisone dose was reduced to 10 mg alternate with 20 mg On 05/04/2013, I order one unit of blood transfusion. I The prednisone dose the same but reduced methotrexate to 2.5 mg once a week On 05/11/2013, I started to reduce prednisone dose further to 10 mg daily but to keep methotrexate at 2.5 mg once a week On 05/25/2013, I further reduce prednisone to 10 mg alternate with 7.5 mg and keep methotrexate at 2.5 mg once a week On 06/09/2013, and I further reduce prednisone to 5 mg daily and keep methotrexate at 2.5 mg once a week On 08/14/2013, I increase the methotrexate to 5 mg once a week and Prednisone at 5 mg daily. On 09/10/2013, I reduced prednisone to 5 mg alternate with 2.5 mg and keep methotrexate at 5 mg once a week. On 10/10/2013, I increased the methotrexate to 10 mg once a week and The  prednisone at 5 mg alternate with 2.5 mg.  INTERVAL HISTORY: Stacey Cooley 73 y.o. female returns for further followup. She complained of fatigue. She still had persistent headaches. She has not been checking her blood pressure home. She continues to have persistent nasal drainage and intermittent night sweats. Her leg edema is getting worse despite on furosemide. She is managed lost only 3 pounds of fluid weight. She denies shortness of breath.  I have reviewed the past medical history, past surgical history, social history and family history with the patient and they are unchanged from previous note.  ALLERGIES:  is allergic to latex; penicillins; and sulfonamide derivatives.  MEDICATIONS:  Current Outpatient Prescriptions  Medication Sig Dispense Refill  . acetaminophen-codeine (TYLENOL #3) 300-30 MG per tablet Take 1 tablet by mouth 4 (four) times daily as needed for pain.       Marland Kitchen allopurinol (ZYLOPRIM) 300 MG tablet Take 300 mg by mouth every morning.       . butalbital-acetaminophen-caffeine (FIORICET, ESGIC) 50-325-40 MG per tablet Take 1 tablet by mouth every 6 (six) hours as needed for headache.  90 tablet  0  . Calcium Carbonate-Vitamin D (CALCIUM 600+D) 600-400 MG-UNIT per tablet Take 1 tablet by mouth daily.      . cholecalciferol (VITAMIN D) 1000 UNITS tablet Take 1,000 Units by mouth 2 (two) times daily.      . ciprofloxacin (CIPRO) 500 MG tablet Take 1 tablet (500 mg total) by mouth 2 (two) times daily.  20 tablet  0  .  citalopram (CELEXA) 20 MG tablet Take 20 mg by mouth every morning.       . Cyanocobalamin (VITAMIN B-12 SL) Place 1 tablet under the tongue daily.      . fluticasone (FLONASE) 50 MCG/ACT nasal spray Place 3 sprays into the nose daily.      . furosemide (LASIX) 40 MG tablet Take 1 tablet (40 mg total) by mouth daily.  30 tablet  3  . levothyroxine (SYNTHROID, LEVOTHROID) 75 MCG tablet Take 75 mcg by mouth daily before breakfast.       . methotrexate  (RHEUMATREX) 2.5 MG tablet Take 2 tablets (5 mg total) by mouth once a week. Caution:Chemotherapy. Protect from light.  30 tablet  0  . metoprolol tartrate (LOPRESSOR) 25 MG tablet Take 25 mg by mouth 2 (two) times daily.        . Multiple Vitamin (MULTIVITAMIN WITH MINERALS) TABS Take 1 tablet by mouth daily. Centrum      . ondansetron (ZOFRAN) 8 MG tablet Take 1 tablet (8 mg total) by mouth every 8 (eight) hours as needed for nausea.  30 tablet  3  . oxybutynin (DITROPAN) 5 MG tablet Take 5 mg by mouth Daily.      . predniSONE (DELTASONE) 5 MG tablet Take 5 mg by mouth daily with breakfast. Mon Wed And Fridays 5 mg and 2.5 mg the rest of the week.      . folic acid (FOLVITE) 1 MG tablet Take 1 tablet (1 mg total) by mouth daily.  90 tablet  3   No current facility-administered medications for this visit.    REVIEW OF SYSTEMS:   Constitutional: Denies fevers, chills  Eyes: Denies blurriness of vision Ears, nose, mouth, throat, and face: Denies mucositis or sore throat Cardiovascular: Denies palpitation, chest discomfort  Gastrointestinal:  Denies nausea, heartburn or change in bowel habits Skin: Denies abnormal skin rashes Lymphatics: Denies new lymphadenopathy or easy bruising Neurological:Denies numbness, tingling or new weaknesses Behavioral/Psych: Mood is stable, no new changes  All other systems were reviewed with the patient and are negative.  PHYSICAL EXAMINATION: ECOG PERFORMANCE STATUS: 2 - Symptomatic, <50% confined to bed  Filed Vitals:   10/10/13 0818  BP: 109/52  Pulse: 53  Temp: 97.6 F (36.4 C)  Resp: 20   Filed Weights   10/10/13 0818  Weight: 221 lb 12.8 oz (100.608 kg)    GENERAL:alert, no distress and comfortable. She is morbidly obese SKIN: skin color, texture, turgor are normal, no rashes or significant lesions EYES: normal, Conjunctiva are pink and non-injected, sclera clear OROPHARYNX:no exudate, no erythema and lips, buccal mucosa, and tongue normal   HEART: She has significant bilateral lower extremity edema ABDOMEN:abdomen soft, non-tender and normal bowel sounds Musculoskeletal:no cyanosis of digits and no clubbing  NEURO: alert & oriented x 3 with fluent speech, no focal motor/sensory deficits  LABORATORY DATA:  I have reviewed the data as listed    Component Value Date/Time   NA 144 10/10/2013 0805   NA 144 08/02/2012 1500   K 3.6 10/10/2013 0805   K 4.0 08/02/2012 1500   CL 105 10/25/2012 1135   CL 103 08/02/2012 1500   CO2 26 10/10/2013 0805   CO2 28 08/02/2012 1500   GLUCOSE 91 10/10/2013 0805   GLUCOSE 85 10/25/2012 1135   GLUCOSE 76 08/02/2012 1500   BUN 24.5 10/10/2013 0805   BUN 15 08/02/2012 1500   CREATININE 1.0 10/10/2013 0805   CREATININE 0.94 08/02/2012 1500   CREATININE 1.22*  09/08/2011 1120   CALCIUM 9.6 10/10/2013 0805   CALCIUM 10.3 08/02/2012 1500   PROT 6.5 10/10/2013 0805   PROT 7.9 08/02/2012 1500   ALBUMIN 4.0 10/10/2013 0805   ALBUMIN 4.1 08/02/2012 1500   AST 43* 10/10/2013 0805   AST 29 08/02/2012 1500   ALT 32 10/10/2013 0805   ALT 38* 08/02/2012 1500   ALKPHOS 75 10/10/2013 0805   ALKPHOS 151* 08/02/2012 1500   BILITOT 0.85 10/10/2013 0805   BILITOT 0.3 08/02/2012 1500   GFRNONAA 61* 08/02/2012 1500   GFRAA 70* 08/02/2012 1500    No results found for this basename: SPEP,  UPEP,   kappa and lambda light chains    Lab Results  Component Value Date   WBC 2.5* 10/10/2013   NEUTROABS 0.7* 10/10/2013   HGB 10.4* 10/10/2013   HCT 32.5* 10/10/2013   MCV 90.6 10/10/2013   PLT 117* 10/10/2013      Chemistry      Component Value Date/Time   NA 144 10/10/2013 0805   NA 144 08/02/2012 1500   K 3.6 10/10/2013 0805   K 4.0 08/02/2012 1500   CL 105 10/25/2012 1135   CL 103 08/02/2012 1500   CO2 26 10/10/2013 0805   CO2 28 08/02/2012 1500   BUN 24.5 10/10/2013 0805   BUN 15 08/02/2012 1500   CREATININE 1.0 10/10/2013 0805   CREATININE 0.94 08/02/2012 1500   CREATININE 1.22* 09/08/2011 1120      Component Value Date/Time    CALCIUM 9.6 10/10/2013 0805   CALCIUM 10.3 08/02/2012 1500   ALKPHOS 75 10/10/2013 0805   ALKPHOS 151* 08/02/2012 1500   AST 43* 10/10/2013 0805   AST 29 08/02/2012 1500   ALT 32 10/10/2013 0805   ALT 38* 08/02/2012 1500   BILITOT 0.85 10/10/2013 0805   BILITOT 0.3 08/02/2012 1500     ASSESSMENT & PLAN:  #1 LGL leukemia #2 anemia #3 mild thrombocytopenia #4 leukopenia The patient still has persistent disease. Overall her blood work is slightly worsening when I initiated steroid taper. The patient is only on a small dose of methotrexate. When I increase her methotrexate dose up in the past, she developed acute renal failure. I recommend second opinion at [facility] and she agreed. I want to increase her methotrexate dose to 10 mg once a week. I prescribed folic acid supplements. I want to keep her on current dose of steroid taper. The patient is warned about risk of infection and pancytopenia with increased dose of methotrexate. She will return here weekly for blood work. #5 hot flashes/sweats I suspect this is related to disease. I recommend conservative management #6 moderate lower extremity edema I want her to increase Lasix to 40 mg daily and will start her on potassium supplement #7 loss of muscle mass This is due to prednisone. As mentioned above, I plan to reduce and start taper of prednisone #8 chronic headaches I am wondering whether this could be related to hypotension. I recommend reducing metoprolol to half a tablet twice a day. She will continue taking Fioricet as needed.  All questions were answered. The patient knows to call the clinic with any problems, questions or concerns. No barriers to learning was detected.    Heath Lark, MD 10/10/2013 10:47 AM

## 2013-10-10 NOTE — Telephone Encounter (Signed)
gv and printed appt sched and avs for pt for May and June °

## 2013-10-11 ENCOUNTER — Telehealth: Payer: Self-pay | Admitting: Hematology and Oncology

## 2013-10-11 NOTE — Telephone Encounter (Signed)
Pt appt with Dr. Rollene Rotunda @ [facility] is 10/23/13@9 :15. Faxed pt medical records,slides and scans will be fedex'ed. Left pt vm on her cell phone to return call in ref to [facility] appt.

## 2013-10-16 ENCOUNTER — Telehealth: Payer: Self-pay | Admitting: *Deleted

## 2013-10-16 ENCOUNTER — Other Ambulatory Visit: Payer: Self-pay | Admitting: Hematology and Oncology

## 2013-10-16 ENCOUNTER — Other Ambulatory Visit (HOSPITAL_BASED_OUTPATIENT_CLINIC_OR_DEPARTMENT_OTHER): Payer: Medicare Other

## 2013-10-16 DIAGNOSIS — R609 Edema, unspecified: Secondary | ICD-10-CM

## 2013-10-16 DIAGNOSIS — R519 Headache, unspecified: Secondary | ICD-10-CM

## 2013-10-16 DIAGNOSIS — C91Z0 Other lymphoid leukemia not having achieved remission: Secondary | ICD-10-CM

## 2013-10-16 DIAGNOSIS — R51 Headache: Secondary | ICD-10-CM

## 2013-10-16 LAB — CBC WITH DIFFERENTIAL/PLATELET
BASO%: 0.9 % (ref 0.0–2.0)
Basophils Absolute: 0 10*3/uL (ref 0.0–0.1)
EOS ABS: 0 10*3/uL (ref 0.0–0.5)
EOS%: 1.4 % (ref 0.0–7.0)
HCT: 33.5 % — ABNORMAL LOW (ref 34.8–46.6)
HGB: 10.5 g/dL — ABNORMAL LOW (ref 11.6–15.9)
LYMPH#: 1.5 10*3/uL (ref 0.9–3.3)
LYMPH%: 57.8 % — AB (ref 14.0–49.7)
MCH: 28.7 pg (ref 25.1–34.0)
MCHC: 31.4 g/dL — AB (ref 31.5–36.0)
MCV: 91.1 fL (ref 79.5–101.0)
MONO#: 0.2 10*3/uL (ref 0.1–0.9)
MONO%: 6.7 % (ref 0.0–14.0)
NEUT#: 0.9 10*3/uL — ABNORMAL LOW (ref 1.5–6.5)
NEUT%: 33.2 % — ABNORMAL LOW (ref 38.4–76.8)
PLATELETS: 102 10*3/uL — AB (ref 145–400)
RBC: 3.67 10*6/uL — ABNORMAL LOW (ref 3.70–5.45)
RDW: 19.5 % — AB (ref 11.2–14.5)
WBC: 2.6 10*3/uL — ABNORMAL LOW (ref 3.9–10.3)

## 2013-10-16 LAB — COMPREHENSIVE METABOLIC PANEL (CC13)
ALBUMIN: 4.1 g/dL (ref 3.5–5.0)
ALT: 32 U/L (ref 0–55)
ANION GAP: 12 meq/L — AB (ref 3–11)
AST: 44 U/L — ABNORMAL HIGH (ref 5–34)
Alkaline Phosphatase: 79 U/L (ref 40–150)
BILIRUBIN TOTAL: 0.96 mg/dL (ref 0.20–1.20)
BUN: 19.9 mg/dL (ref 7.0–26.0)
CHLORIDE: 100 meq/L (ref 98–109)
CO2: 31 meq/L — AB (ref 22–29)
Calcium: 9.9 mg/dL (ref 8.4–10.4)
Creatinine: 1 mg/dL (ref 0.6–1.1)
GLUCOSE: 100 mg/dL (ref 70–140)
POTASSIUM: 4 meq/L (ref 3.5–5.1)
Sodium: 143 mEq/L (ref 136–145)
TOTAL PROTEIN: 6.6 g/dL (ref 6.4–8.3)

## 2013-10-16 LAB — TECHNOLOGIST REVIEW

## 2013-10-16 MED ORDER — METHOTREXATE SODIUM 10 MG PO TABS
10.0000 mg | ORAL_TABLET | ORAL | Status: DC
Start: 2013-10-16 — End: 2013-10-16

## 2013-10-16 MED ORDER — METHOTREXATE SODIUM 10 MG PO TABS: 10.0000 mg | ORAL_TABLET | ORAL | Status: DC

## 2013-10-16 NOTE — Telephone Encounter (Signed)
FYI;  Pt also states has appt at [facility] next Tuesday.  She will have them fax [place] copy of CBC and we will cancel her lab appt here on 6/02.

## 2013-10-16 NOTE — Telephone Encounter (Signed)
I refilled it but I prescribed the 10 mg tabs so she only need to take 1 weekly

## 2013-10-16 NOTE — Telephone Encounter (Signed)
Informed pt of and gave copy of today's CBC results.  Instructed, per Dr. Alvy Bimler, to continue same dose of Methotrexate 10 mg weekly and to continue same dose of prednisone 5 mg alternate w/ 2.5 mg every other day.   She verbalized understanding.  Pt states needs refill on Methotrexate sent to [facility] on [address] in [place].

## 2013-10-16 NOTE — Telephone Encounter (Signed)
Informed pt of CMET results ok and also new rx for MTX 10 mg tabs,  Continue 10 mg weekly, only need to take one tablet each dose.  Pt verbalized understanding.

## 2013-10-23 ENCOUNTER — Other Ambulatory Visit: Payer: Medicare Other

## 2013-10-25 ENCOUNTER — Other Ambulatory Visit: Payer: Medicare Other | Admitting: Lab

## 2013-10-25 ENCOUNTER — Ambulatory Visit: Payer: Medicare Other

## 2013-10-25 ENCOUNTER — Ambulatory Visit: Payer: Medicare Other | Admitting: Hematology and Oncology

## 2013-10-26 ENCOUNTER — Ambulatory Visit: Payer: Self-pay | Admitting: Internal Medicine

## 2013-10-30 ENCOUNTER — Telehealth: Payer: Self-pay | Admitting: *Deleted

## 2013-10-30 ENCOUNTER — Other Ambulatory Visit: Payer: Self-pay | Admitting: Hematology and Oncology

## 2013-10-30 ENCOUNTER — Other Ambulatory Visit (HOSPITAL_BASED_OUTPATIENT_CLINIC_OR_DEPARTMENT_OTHER): Payer: Medicare Other

## 2013-10-30 DIAGNOSIS — R519 Headache, unspecified: Secondary | ICD-10-CM

## 2013-10-30 DIAGNOSIS — R51 Headache: Secondary | ICD-10-CM

## 2013-10-30 DIAGNOSIS — C91Z0 Other lymphoid leukemia not having achieved remission: Secondary | ICD-10-CM

## 2013-10-30 DIAGNOSIS — R609 Edema, unspecified: Secondary | ICD-10-CM

## 2013-10-30 LAB — CBC WITH DIFFERENTIAL/PLATELET
BASO%: 0.4 % (ref 0.0–2.0)
Basophils Absolute: 0 10*3/uL (ref 0.0–0.1)
EOS%: 2.1 % (ref 0.0–7.0)
Eosinophils Absolute: 0.1 10*3/uL (ref 0.0–0.5)
HCT: 31.4 % — ABNORMAL LOW (ref 34.8–46.6)
HGB: 10 g/dL — ABNORMAL LOW (ref 11.6–15.9)
LYMPH%: 66.5 % — ABNORMAL HIGH (ref 14.0–49.7)
MCH: 29.4 pg (ref 25.1–34.0)
MCHC: 31.8 g/dL (ref 31.5–36.0)
MCV: 92.4 fL (ref 79.5–101.0)
MONO#: 0.1 10*3/uL (ref 0.1–0.9)
MONO%: 4.7 % (ref 0.0–14.0)
NEUT#: 0.6 10*3/uL — ABNORMAL LOW (ref 1.5–6.5)
NEUT%: 26.3 % — AB (ref 38.4–76.8)
Platelets: 77 10*3/uL — ABNORMAL LOW (ref 145–400)
RBC: 3.4 10*6/uL — ABNORMAL LOW (ref 3.70–5.45)
RDW: 18.9 % — AB (ref 11.2–14.5)
WBC: 2.4 10*3/uL — ABNORMAL LOW (ref 3.9–10.3)
lymph#: 1.6 10*3/uL (ref 0.9–3.3)

## 2013-10-30 LAB — COMPREHENSIVE METABOLIC PANEL (CC13)
ALK PHOS: 87 U/L (ref 40–150)
ALT: 34 U/L (ref 0–55)
AST: 43 U/L — AB (ref 5–34)
Albumin: 4 g/dL (ref 3.5–5.0)
Anion Gap: 10 mEq/L (ref 3–11)
BUN: 21.3 mg/dL (ref 7.0–26.0)
CO2: 32 mEq/L — ABNORMAL HIGH (ref 22–29)
CREATININE: 1.1 mg/dL (ref 0.6–1.1)
Calcium: 9.5 mg/dL (ref 8.4–10.4)
Chloride: 101 mEq/L (ref 98–109)
Glucose: 88 mg/dl (ref 70–140)
Potassium: 3.6 mEq/L (ref 3.5–5.1)
Sodium: 144 mEq/L (ref 136–145)
Total Bilirubin: 0.97 mg/dL (ref 0.20–1.20)
Total Protein: 6.5 g/dL (ref 6.4–8.3)

## 2013-10-30 NOTE — Telephone Encounter (Signed)
Informed pt of lab results today. Continue MTX 10 mg weekly per Dr. Alvy Bimler. Keep lab appt next week as scheduled.  Pt verbalized understanding.  She also gave [place] copy of labs from [facility]. She was seen there last week on June 2 and scheduled to return there again on June 30th.  Lab copy placed on Dr. Calton Dach desk for her review.

## 2013-10-30 NOTE — Telephone Encounter (Signed)
Message copied by Cathlean Cower on Tue Oct 30, 2013  9:27 AM ------      Message from: [facility], [place]      Created: Tue Oct 30, 2013  8:41 AM      Regarding: lab       Continue MTX at 10 mg per week      Please check status of referral      ----- Message -----         From: [facility]         Sent: 10/30/2013   8:36 AM           To: Heath Lark, MD                   ------

## 2013-11-06 ENCOUNTER — Telehealth: Payer: Self-pay | Admitting: *Deleted

## 2013-11-06 ENCOUNTER — Other Ambulatory Visit (HOSPITAL_BASED_OUTPATIENT_CLINIC_OR_DEPARTMENT_OTHER): Payer: Medicare Other

## 2013-11-06 DIAGNOSIS — R519 Headache, unspecified: Secondary | ICD-10-CM

## 2013-11-06 DIAGNOSIS — R51 Headache: Secondary | ICD-10-CM

## 2013-11-06 DIAGNOSIS — R609 Edema, unspecified: Secondary | ICD-10-CM

## 2013-11-06 DIAGNOSIS — D649 Anemia, unspecified: Secondary | ICD-10-CM

## 2013-11-06 DIAGNOSIS — C91Z0 Other lymphoid leukemia not having achieved remission: Secondary | ICD-10-CM

## 2013-11-06 LAB — COMPREHENSIVE METABOLIC PANEL (CC13)
ALBUMIN: 3.8 g/dL (ref 3.5–5.0)
ALK PHOS: 82 U/L (ref 40–150)
ALT: 28 U/L (ref 0–55)
AST: 38 U/L — ABNORMAL HIGH (ref 5–34)
Anion Gap: 10 mEq/L (ref 3–11)
BILIRUBIN TOTAL: 0.92 mg/dL (ref 0.20–1.20)
BUN: 11.6 mg/dL (ref 7.0–26.0)
CO2: 29 mEq/L (ref 22–29)
CREATININE: 1 mg/dL (ref 0.6–1.1)
Calcium: 9 mg/dL (ref 8.4–10.4)
Chloride: 103 mEq/L (ref 98–109)
GLUCOSE: 107 mg/dL (ref 70–140)
Potassium: 3.7 mEq/L (ref 3.5–5.1)
Sodium: 142 mEq/L (ref 136–145)
Total Protein: 6.2 g/dL — ABNORMAL LOW (ref 6.4–8.3)

## 2013-11-06 LAB — CBC WITH DIFFERENTIAL/PLATELET
BASO%: 0.8 % (ref 0.0–2.0)
BASOS ABS: 0 10*3/uL (ref 0.0–0.1)
EOS ABS: 0 10*3/uL (ref 0.0–0.5)
EOS%: 1.4 % (ref 0.0–7.0)
HEMATOCRIT: 29.1 % — AB (ref 34.8–46.6)
HEMOGLOBIN: 9.3 g/dL — AB (ref 11.6–15.9)
LYMPH%: 53.3 % — AB (ref 14.0–49.7)
MCH: 29.5 pg (ref 25.1–34.0)
MCHC: 31.8 g/dL (ref 31.5–36.0)
MCV: 92.7 fL (ref 79.5–101.0)
MONO#: 0.1 10*3/uL (ref 0.1–0.9)
MONO%: 4.8 % (ref 0.0–14.0)
NEUT%: 39.7 % (ref 38.4–76.8)
NEUTROS ABS: 1.1 10*3/uL — AB (ref 1.5–6.5)
Platelets: 101 10*3/uL — ABNORMAL LOW (ref 145–400)
RBC: 3.14 10*6/uL — ABNORMAL LOW (ref 3.70–5.45)
RDW: 20.4 % — ABNORMAL HIGH (ref 11.2–14.5)
WBC: 2.7 10*3/uL — ABNORMAL LOW (ref 3.9–10.3)
lymph#: 1.4 10*3/uL (ref 0.9–3.3)

## 2013-11-06 NOTE — Telephone Encounter (Signed)
Message copied by Cathlean Cower on Tue Nov 06, 2013  9:22 AM ------      Message from: [facility], [place]      Created: Tue Nov 06, 2013  8:10 AM       Continue same dose MTX      ----- Message -----         From: [facility]         Sent: 11/06/2013   8:03 AM           To: Heath Lark, MD                   ------

## 2013-11-06 NOTE — Telephone Encounter (Signed)
Informed pt continue same dose of MTX and keep appt on 6/23 as scheduled.  She verbalized understanding.

## 2013-11-13 ENCOUNTER — Encounter: Payer: Self-pay | Admitting: Hematology and Oncology

## 2013-11-13 ENCOUNTER — Other Ambulatory Visit (HOSPITAL_BASED_OUTPATIENT_CLINIC_OR_DEPARTMENT_OTHER): Payer: Medicare Other

## 2013-11-13 ENCOUNTER — Ambulatory Visit (HOSPITAL_BASED_OUTPATIENT_CLINIC_OR_DEPARTMENT_OTHER): Payer: Medicare Other | Admitting: Hematology and Oncology

## 2013-11-13 ENCOUNTER — Other Ambulatory Visit: Payer: Self-pay | Admitting: Hematology and Oncology

## 2013-11-13 VITALS — BP 108/57 | HR 62 | Temp 98.1°F | Resp 18 | Ht 62.0 in | Wt 216.2 lb

## 2013-11-13 DIAGNOSIS — C91Z0 Other lymphoid leukemia not having achieved remission: Secondary | ICD-10-CM

## 2013-11-13 DIAGNOSIS — D6959 Other secondary thrombocytopenia: Secondary | ICD-10-CM

## 2013-11-13 DIAGNOSIS — D709 Neutropenia, unspecified: Secondary | ICD-10-CM

## 2013-11-13 DIAGNOSIS — R609 Edema, unspecified: Secondary | ICD-10-CM

## 2013-11-13 DIAGNOSIS — D630 Anemia in neoplastic disease: Secondary | ICD-10-CM

## 2013-11-13 DIAGNOSIS — T50905A Adverse effect of unspecified drugs, medicaments and biological substances, initial encounter: Secondary | ICD-10-CM

## 2013-11-13 DIAGNOSIS — E876 Hypokalemia: Secondary | ICD-10-CM | POA: Insufficient documentation

## 2013-11-13 DIAGNOSIS — R51 Headache: Secondary | ICD-10-CM

## 2013-11-13 DIAGNOSIS — R519 Headache, unspecified: Secondary | ICD-10-CM

## 2013-11-13 LAB — CBC WITH DIFFERENTIAL/PLATELET
BASO%: 0.7 % (ref 0.0–2.0)
Basophils Absolute: 0 10*3/uL (ref 0.0–0.1)
EOS ABS: 0 10*3/uL (ref 0.0–0.5)
EOS%: 1.7 % (ref 0.0–7.0)
HCT: 29.7 % — ABNORMAL LOW (ref 34.8–46.6)
HEMOGLOBIN: 9.4 g/dL — AB (ref 11.6–15.9)
LYMPH#: 1.4 10*3/uL (ref 0.9–3.3)
LYMPH%: 59 % — ABNORMAL HIGH (ref 14.0–49.7)
MCH: 29.6 pg (ref 25.1–34.0)
MCHC: 31.6 g/dL (ref 31.5–36.0)
MCV: 93.8 fL (ref 79.5–101.0)
MONO#: 0.1 10*3/uL (ref 0.1–0.9)
MONO%: 5.2 % (ref 0.0–14.0)
NEUT%: 33.4 % — ABNORMAL LOW (ref 38.4–76.8)
NEUTROS ABS: 0.8 10*3/uL — AB (ref 1.5–6.5)
Platelets: 104 10*3/uL — ABNORMAL LOW (ref 145–400)
RBC: 3.17 10*6/uL — ABNORMAL LOW (ref 3.70–5.45)
RDW: 21 % — ABNORMAL HIGH (ref 11.2–14.5)
WBC: 2.5 10*3/uL — AB (ref 3.9–10.3)

## 2013-11-13 LAB — COMPREHENSIVE METABOLIC PANEL (CC13)
ALT: 25 U/L (ref 0–55)
AST: 41 U/L — ABNORMAL HIGH (ref 5–34)
Albumin: 3.7 g/dL (ref 3.5–5.0)
Alkaline Phosphatase: 84 U/L (ref 40–150)
Anion Gap: 10 mEq/L (ref 3–11)
BUN: 14.9 mg/dL (ref 7.0–26.0)
CALCIUM: 9.5 mg/dL (ref 8.4–10.4)
CHLORIDE: 103 meq/L (ref 98–109)
CO2: 29 meq/L (ref 22–29)
CREATININE: 0.9 mg/dL (ref 0.6–1.1)
GLUCOSE: 94 mg/dL (ref 70–140)
Potassium: 3.4 mEq/L — ABNORMAL LOW (ref 3.5–5.1)
Sodium: 143 mEq/L (ref 136–145)
Total Bilirubin: 0.97 mg/dL (ref 0.20–1.20)
Total Protein: 6 g/dL — ABNORMAL LOW (ref 6.4–8.3)

## 2013-11-13 NOTE — Assessment & Plan Note (Signed)
This is likely due to recent treatment. The patient denies recent history of fevers, cough, chills, diarrhea or dysuria. She is asymptomatic from the leukopenia. I will observe for now.  I will continue the chemotherapy at current dose without dosage adjustment.  If the leukopenia gets progressive worse in the future, I might have to delay her treatment or adjust the chemotherapy dose.   

## 2013-11-13 NOTE — Assessment & Plan Note (Signed)
This is likely anemia of chronic disease. The patient denies recent history of bleeding such as epistaxis, hematuria or hematochezia. She is asymptomatic from the anemia. We will observe for now.  She does not require transfusion now.   

## 2013-11-13 NOTE — Assessment & Plan Note (Signed)
This is due to her diuretic therapy. She is taking over-the-counter potassium supplement. I recommend potassium rich diet.

## 2013-11-13 NOTE — Assessment & Plan Note (Signed)
This is likely due to recent treatment. The patient denies recent history of bleeding such as epistaxis, hematuria or hematochezia. She is asymptomatic from the low platelet count. I will observe for now.  she does not require transfusion now. I will continue the chemotherapy at current dose without dosage adjustment.  If the thrombocytopenia gets progressive worse in the future, I might have to delay her treatment or adjust the chemotherapy dose.   

## 2013-11-13 NOTE — Assessment & Plan Note (Signed)
This is very poor clear. I am concerned about potential congestive heart failure. I will refer her to cardiologist for further management. In the meantime, she will continue on furosemide daily. She has lost 5 pounds of weight since I saw her last month.

## 2013-11-13 NOTE — Assessment & Plan Note (Signed)
She has obtained second opinion at [facility] and is due to return next week for further assessment. In the meantime, I do not plan to change her medication dose. She will continue taking methotrexate at 10 mg once a week and prednisone at 5 mg alternate with 2.5 mg every other day.

## 2013-11-13 NOTE — Progress Notes (Signed)
[place] OFFICE PROGRESS NOTE  Patient Care Team: Madelyn Brunner, MD as PCP - General (Unknown Physician Specialty) Heath Lark, MD as Consulting Physician (Hematology and Oncology)  DIAGNOSIS: LGL leukemia with severe pancytopenia  SUMMARY OF ONCOLOGIC HISTORY: She was found to have anemia and was placed on observation. The cause of the anemia was thought to be related to chronic kidney disease. The patient also has history of Roux-en-Y gastric bypass and it was thought to be the contributing factor. 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 On 04/09/2013, she was started on prednisone 60 mg daily and methotrexate 7.5 mg once a week by mouth On 04/16/2013, she developed acute renal failure with hyperkalemia. Prednisone dose was reduced to 40 mg a day and methotrexate was placed on hold On 04/23/2013, acute renal failure was improving. The prednisone dose was reduced to 20 mg a day and would resume methotrexate at 7.5 mg once a week. On 04/27/2013, I reduced methotrexate to 5 mg once a week and prednisone dose was reduced to 10 mg alternate with 20 mg On 05/04/2013, I order one unit of blood transfusion. I The prednisone dose the same but reduced methotrexate to 2.5 mg once a week On 05/11/2013, I started to reduce prednisone dose further to 10 mg daily but to keep methotrexate at 2.5 mg once a week On 05/25/2013, I further reduce prednisone to 10 mg alternate with 7.5 mg and keep methotrexate at 2.5 mg once a week On 06/09/2013, and I further reduce prednisone to 5 mg daily and keep methotrexate at 2.5 mg once a week On 08/14/2013, I increase the methotrexate to 5 mg once a week and Prednisone at 5 mg daily. On 09/10/2013, I reduced prednisone to 5 mg alternate with 2.5 mg and keep methotrexate at 5 mg once a week. On 10/10/2013, I increased the methotrexate to 10 mg once a week and The  prednisone at 5 mg alternate with 2.5 mg. On 10/23/2013, she saw a hematologist at [facility] for second opinion. Further recommendations are pending.  INTERVAL HISTORY: Please see below for problem oriented charting. She complained of persistent headache, upper respiratory tract congestion without recent fevers or chills. She complained of fatigue. She has progressive bilateral lower extremity edema.  REVIEW OF SYSTEMS:   Constitutional: Denies fevers, chills or abnormal weight loss Eyes: Denies blurriness of vision Ears, nose, mouth, throat, and face: Denies mucositis or sore throat Cardiovascular: Denies palpitation, chest discomfort  Gastrointestinal:  Denies nausea, heartburn or change in bowel habits Skin: Denies abnormal skin rashes Lymphatics: Denies new lymphadenopathy or easy bruising Neurological:Denies numbness, tingling or new weaknesses Behavioral/Psych: Mood is stable, no new changes  All other systems were reviewed with the patient and are negative.  I have reviewed the past medical history, past surgical history, social history and family history with the patient and they are unchanged from previous note.  ALLERGIES:  is allergic to latex; penicillins; and sulfonamide derivatives.  MEDICATIONS:  Current Outpatient Prescriptions  Medication Sig Dispense Refill  . acetaminophen-codeine (TYLENOL #3) 300-30 MG per tablet Take 1 tablet by mouth 4 (four) times daily as needed for pain.       Marland Kitchen allopurinol (ZYLOPRIM) 300 MG tablet Take 300 mg by mouth every morning.       . butalbital-acetaminophen-caffeine (FIORICET, ESGIC) 50-325-40 MG per tablet Take 1 tablet by mouth every 6 (six) hours as needed for headache.  90 tablet  0  . Calcium Carbonate-Vitamin D (CALCIUM 600+D) 600-400 MG-UNIT per tablet Take 1 tablet by mouth daily.      . cholecalciferol (VITAMIN D) 1000 UNITS tablet Take 1,000 Units by mouth 2 (two) times daily.      . citalopram (CELEXA) 20 MG tablet  Take 20 mg by mouth every morning.       . Cyanocobalamin (VITAMIN B-12 SL) Place 1 tablet under the tongue daily.      . fluticasone (FLONASE) 50 MCG/ACT nasal spray Place 3 sprays into the nose daily.      . folic acid (FOLVITE) 1 MG tablet Take 1 tablet (1 mg total) by mouth daily.  90 tablet  3  . furosemide (LASIX) 40 MG tablet Take 1 tablet (40 mg total) by mouth daily.  30 tablet  3  . levothyroxine (SYNTHROID, LEVOTHROID) 75 MCG tablet Take 75 mcg by mouth daily before breakfast.       . methotrexate (RHEUMATREX) 10 MG tablet Take 1 tablet (10 mg total) by mouth once a week. Caution:Chemotherapy. Protect from light.  30 tablet  0  . metoprolol tartrate (LOPRESSOR) 25 MG tablet Take 25 mg by mouth 2 (two) times daily.        . Multiple Vitamin (MULTIVITAMIN WITH MINERALS) TABS Take 1 tablet by mouth daily. Centrum      . ondansetron (ZOFRAN) 8 MG tablet Take 1 tablet (8 mg total) by mouth every 8 (eight) hours as needed for nausea.  30 tablet  3  . oxybutynin (DITROPAN) 5 MG tablet Take 5 mg by mouth Daily.      . Potassium 99 MG TABS Take by mouth.      . predniSONE (DELTASONE) 5 MG tablet Take 5 mg by mouth daily with breakfast. Mon Wed And Fridays 5 mg and 2.5 mg the rest of the week.       No current facility-administered medications for this visit.    PHYSICAL EXAMINATION: ECOG PERFORMANCE STATUS: 1 - Symptomatic but completely ambulatory  Filed Vitals:   11/13/13 0822  BP: 108/57  Pulse: 62  Temp: 98.1 F (36.7 C)  Resp: 18   Filed Weights   11/13/13 0822  Weight: 216 lb 3.2 oz (98.068 kg)    GENERAL:alert, no distress and comfortable. She is morbidly obese SKIN: skin color, texture, turgor are normal, no rashes or significant lesions EYES: normal, Conjunctiva are pink and non-injected, sclera clear OROPHARYNX:no exudate, no erythema and lips, buccal mucosa, and tongue normal  NECK: supple, thyroid normal size, non-tender, without nodularity LYMPH:  no palpable  lymphadenopathy in the cervical, axillary or inguinal LUNGS: clear to auscultation and percussion with normal breathing effort HEART: regular rate & rhythm and no murmurs with moderate bilateral lower extremity edema ABDOMEN:abdomen soft, non-tender and normal bowel sounds Musculoskeletal:no cyanosis of digits and no clubbing  NEURO: alert & oriented x 3 with fluent speech, no focal motor/sensory deficits  LABORATORY DATA:  I have reviewed the data as listed    Component Value Date/Time   NA 143 11/13/2013 0812   NA 144 08/02/2012 1500   K 3.4* 11/13/2013 0812   K 4.0 08/02/2012 1500   CL 105 10/25/2012 1135   CL 103 08/02/2012 1500   CO2 29 11/13/2013 0812   CO2 28 08/02/2012 1500   GLUCOSE 94 11/13/2013 0812   GLUCOSE 85 10/25/2012 1135   GLUCOSE 76 08/02/2012 1500   BUN 14.9 11/13/2013 0812   BUN 15 08/02/2012 1500   CREATININE 0.9  11/13/2013 0812   CREATININE 0.94 08/02/2012 1500   CREATININE 1.22* 09/08/2011 1120   CALCIUM 9.5 11/13/2013 0812   CALCIUM 10.3 08/02/2012 1500   PROT 6.0* 11/13/2013 0812   PROT 7.9 08/02/2012 1500   ALBUMIN 3.7 11/13/2013 0812   ALBUMIN 4.1 08/02/2012 1500   AST 41* 11/13/2013 0812   AST 29 08/02/2012 1500   ALT 25 11/13/2013 0812   ALT 38* 08/02/2012 1500   ALKPHOS 84 11/13/2013 0812   ALKPHOS 151* 08/02/2012 1500   BILITOT 0.97 11/13/2013 0812   BILITOT 0.3 08/02/2012 1500   GFRNONAA 61* 08/02/2012 1500   GFRAA 70* 08/02/2012 1500    No results found for this basename: SPEP,  UPEP,   kappa and lambda light chains    Lab Results  Component Value Date   WBC 2.5* 11/13/2013   NEUTROABS 0.8* 11/13/2013   HGB 9.4* 11/13/2013   HCT 29.7* 11/13/2013   MCV 93.8 11/13/2013   PLT 104* 11/13/2013      Chemistry      Component Value Date/Time   NA 143 11/13/2013 0812   NA 144 08/02/2012 1500   K 3.4* 11/13/2013 0812   K 4.0 08/02/2012 1500   CL 105 10/25/2012 1135   CL 103 08/02/2012 1500   CO2 29 11/13/2013 0812   CO2 28 08/02/2012 1500   BUN 14.9 11/13/2013 0812   BUN  15 08/02/2012 1500   CREATININE 0.9 11/13/2013 0812   CREATININE 0.94 08/02/2012 1500   CREATININE 1.22* 09/08/2011 1120      Component Value Date/Time   CALCIUM 9.5 11/13/2013 0812   CALCIUM 10.3 08/02/2012 1500   ALKPHOS 84 11/13/2013 0812   ALKPHOS 151* 08/02/2012 1500   AST 41* 11/13/2013 0812   AST 29 08/02/2012 1500   ALT 25 11/13/2013 0812   ALT 38* 08/02/2012 1500   BILITOT 0.97 11/13/2013 0812   BILITOT 0.3 08/02/2012 1500     ASSESSMENT & PLAN:  Large granular lymphocytic leukemia She has obtained second opinion at [facility] and is due to return next week for further assessment. In the meantime, I do not plan to change her medication dose. She will continue taking methotrexate at 10 mg once a week and prednisone at 5 mg alternate with 2.5 mg every other day.  Anemia in neoplastic disease This is likely anemia of chronic disease. The patient denies recent history of bleeding such as epistaxis, hematuria or hematochezia. She is asymptomatic from the anemia. We will observe for now.  She does not require transfusion now.    Neutropenia, unspecified This is likely due to recent treatment. The patient denies recent history of fevers, cough, chills, diarrhea or dysuria. She is asymptomatic from the leukopenia. I will observe for now.  I will continue the chemotherapy at current dose without dosage adjustment.  If the leukopenia gets progressive worse in the future, I might have to delay her treatment or adjust the chemotherapy dose.    Thrombocytopenia due to drugs This is likely due to recent treatment. The patient denies recent history of bleeding such as epistaxis, hematuria or hematochezia. She is asymptomatic from the low platelet count. I will observe for now.  she does not require transfusion now. I will continue the chemotherapy at current dose without dosage adjustment.  If the thrombocytopenia gets progressive worse in the future, I might have to delay her treatment or adjust the  chemotherapy dose.    EDEMA This is very poor clear. I am concerned about potential  congestive heart failure. I will refer her to cardiologist for further management. In the meantime, she will continue on furosemide daily. She has lost 5 pounds of weight since I saw her last month.  Hypokalemia This is due to her diuretic therapy. She is taking over-the-counter potassium supplement. I recommend potassium rich diet.    Orders Placed This Encounter  Procedures  . CBC with Differential    Standing Status: Standing     Number of Occurrences: 33     Standing Expiration Date: 11/14/2014  . Ambulatory referral to Cardiology    Referral Priority:  Routine    Referral Type:  Consultation    Referral Reason:  Specialty Services Required    Referred to Provider:  Dorothy Spark, MD    Requested Specialty:  Cardiology    Number of Visits Requested:  1   All questions were answered. The patient knows to call the clinic with any problems, questions or concerns. No barriers to learning was detected.    [place], [place], MD 11/13/2013 9:22 AM

## 2013-11-15 ENCOUNTER — Telehealth: Payer: Self-pay | Admitting: Hematology and Oncology

## 2013-11-15 NOTE — Telephone Encounter (Signed)
lvm for pt with cardiology appt with Dr. Meda Coffee on 7.28 @ 8am

## 2013-11-21 ENCOUNTER — Other Ambulatory Visit: Payer: Self-pay | Admitting: Hematology and Oncology

## 2013-11-21 ENCOUNTER — Telehealth: Payer: Self-pay | Admitting: *Deleted

## 2013-11-21 NOTE — Telephone Encounter (Signed)
Pt states she wants Dr. Alvy Bimler to continue to prescribe her MTX and prednisone and order labs to monitor as needed.  She is also going to continue her appts at [facility].  Pt needs appts here scheduled.  She will need refill on MTX after next week.  She has 8 pills left and will take 4 pills on Monday.

## 2013-11-21 NOTE — Telephone Encounter (Signed)
Ask her if she wants to follow-up with just [facility]. They can decide how often she needs labs

## 2013-11-21 NOTE — Telephone Encounter (Signed)
Pt left a VM states she was told by Dr. 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  Pt asks how often she needs to continue to have lab work done?

## 2013-11-22 ENCOUNTER — Other Ambulatory Visit: Payer: Self-pay | Admitting: Hematology and Oncology

## 2013-11-22 ENCOUNTER — Telehealth: Payer: Self-pay | Admitting: *Deleted

## 2013-11-22 DIAGNOSIS — C91Z0 Other lymphoid leukemia not having achieved remission: Secondary | ICD-10-CM

## 2013-11-22 DIAGNOSIS — R609 Edema, unspecified: Secondary | ICD-10-CM

## 2013-11-22 MED ORDER — METHOTREXATE SODIUM 10 MG PO TABS: 10.0000 mg | ORAL_TABLET | ORAL | Status: DC

## 2013-11-22 MED ORDER — METHOTREXATE 2.5 MG PO TABS: 10.0000 mg | ORAL_TABLET | ORAL | Status: DC

## 2013-11-22 NOTE — Telephone Encounter (Signed)
I refilled MTX to her pharmacy I placed POF for her to get labs on 7/14 andf 7/28 and see her on 7/28

## 2013-11-22 NOTE — Telephone Encounter (Signed)
Informed pt's husband Dr. Alvy Bimler sent refill on MTX and also ordered labs for 7/14 and 7/28 with Office visit on 7/28.  He verbalized understanding.

## 2013-11-23 ENCOUNTER — Telehealth: Payer: Self-pay | Admitting: Hematology and Oncology

## 2013-11-23 NOTE — Telephone Encounter (Signed)
Talked to pt's husband and hagve him labs and MD for month of July 2015

## 2013-12-04 ENCOUNTER — Telehealth: Payer: Self-pay | Admitting: *Deleted

## 2013-12-04 ENCOUNTER — Other Ambulatory Visit (HOSPITAL_BASED_OUTPATIENT_CLINIC_OR_DEPARTMENT_OTHER): Payer: Medicare Other

## 2013-12-04 DIAGNOSIS — R51 Headache: Secondary | ICD-10-CM

## 2013-12-04 DIAGNOSIS — C91Z0 Other lymphoid leukemia not having achieved remission: Secondary | ICD-10-CM

## 2013-12-04 DIAGNOSIS — R519 Headache, unspecified: Secondary | ICD-10-CM

## 2013-12-04 DIAGNOSIS — R609 Edema, unspecified: Secondary | ICD-10-CM

## 2013-12-04 LAB — CBC WITH DIFFERENTIAL/PLATELET
BASO%: 0.5 % (ref 0.0–2.0)
BASOS ABS: 0 10*3/uL (ref 0.0–0.1)
EOS%: 1 % (ref 0.0–7.0)
Eosinophils Absolute: 0 10*3/uL (ref 0.0–0.5)
HEMATOCRIT: 30.7 % — AB (ref 34.8–46.6)
HEMOGLOBIN: 9.8 g/dL — AB (ref 11.6–15.9)
LYMPH#: 2.2 10*3/uL (ref 0.9–3.3)
LYMPH%: 56.5 % — ABNORMAL HIGH (ref 14.0–49.7)
MCH: 29.3 pg (ref 25.1–34.0)
MCHC: 31.9 g/dL (ref 31.5–36.0)
MCV: 91.9 fL (ref 79.5–101.0)
MONO#: 0.2 10*3/uL (ref 0.1–0.9)
MONO%: 4.1 % (ref 0.0–14.0)
NEUT#: 1.5 10*3/uL (ref 1.5–6.5)
NEUT%: 37.9 % — AB (ref 38.4–76.8)
Platelets: 91 10*3/uL — ABNORMAL LOW (ref 145–400)
RBC: 3.34 10*6/uL — ABNORMAL LOW (ref 3.70–5.45)
RDW: 18.8 % — ABNORMAL HIGH (ref 11.2–14.5)
WBC: 3.9 10*3/uL (ref 3.9–10.3)
nRBC: 0 % (ref 0–0)

## 2013-12-04 LAB — COMPREHENSIVE METABOLIC PANEL (CC13)
ALT: 26 U/L (ref 0–55)
AST: 44 U/L — ABNORMAL HIGH (ref 5–34)
Albumin: 3.8 g/dL (ref 3.5–5.0)
Alkaline Phosphatase: 101 U/L (ref 40–150)
Anion Gap: 11 mEq/L (ref 3–11)
BILIRUBIN TOTAL: 1.28 mg/dL — AB (ref 0.20–1.20)
BUN: 14.7 mg/dL (ref 7.0–26.0)
CO2: 29 mEq/L (ref 22–29)
CREATININE: 1.1 mg/dL (ref 0.6–1.1)
Calcium: 9.6 mg/dL (ref 8.4–10.4)
Chloride: 101 mEq/L (ref 98–109)
Glucose: 104 mg/dl (ref 70–140)
Potassium: 3.6 mEq/L (ref 3.5–5.1)
Sodium: 141 mEq/L (ref 136–145)
Total Protein: 6.3 g/dL — ABNORMAL LOW (ref 6.4–8.3)

## 2013-12-04 LAB — TECHNOLOGIST REVIEW

## 2013-12-04 NOTE — Telephone Encounter (Signed)
Message copied by Cathlean Cower on Tue Dec 04, 2013  2:06 PM ------      Message from: [facility], [place]: Tue Dec 04, 2013  1:36 PM      Regarding: labs better       Tests are better, wait till next visit, no change       ----- Message -----         From: [facility]         Sent: 12/04/2013  11:17 AM           To: Heath Lark, MD                   ------

## 2013-12-04 NOTE — Telephone Encounter (Signed)
Informed pt of labs good and to continue same dose of Prednisone and MTX.  Keep appt as scheduled in 2 weeks.  Pt verbalized understanding.

## 2013-12-18 ENCOUNTER — Encounter: Payer: Self-pay | Admitting: Hematology and Oncology

## 2013-12-18 ENCOUNTER — Telehealth: Payer: Self-pay | Admitting: *Deleted

## 2013-12-18 ENCOUNTER — Telehealth: Payer: Self-pay | Admitting: Hematology and Oncology

## 2013-12-18 ENCOUNTER — Ambulatory Visit (HOSPITAL_BASED_OUTPATIENT_CLINIC_OR_DEPARTMENT_OTHER): Payer: Medicare Other | Admitting: Hematology and Oncology

## 2013-12-18 ENCOUNTER — Other Ambulatory Visit (HOSPITAL_BASED_OUTPATIENT_CLINIC_OR_DEPARTMENT_OTHER): Payer: Medicare Other

## 2013-12-18 ENCOUNTER — Ambulatory Visit: Payer: Medicare Other | Admitting: Cardiology

## 2013-12-18 ENCOUNTER — Other Ambulatory Visit: Payer: Self-pay | Admitting: *Deleted

## 2013-12-18 VITALS — BP 102/65 | HR 57 | Temp 98.5°F | Resp 18 | Ht 62.0 in | Wt 210.4 lb

## 2013-12-18 DIAGNOSIS — R519 Headache, unspecified: Secondary | ICD-10-CM

## 2013-12-18 DIAGNOSIS — C91Z0 Other lymphoid leukemia not having achieved remission: Secondary | ICD-10-CM

## 2013-12-18 DIAGNOSIS — R634 Abnormal weight loss: Secondary | ICD-10-CM

## 2013-12-18 DIAGNOSIS — R51 Headache: Secondary | ICD-10-CM

## 2013-12-18 DIAGNOSIS — J3489 Other specified disorders of nose and nasal sinuses: Secondary | ICD-10-CM

## 2013-12-18 DIAGNOSIS — R0981 Nasal congestion: Secondary | ICD-10-CM

## 2013-12-18 DIAGNOSIS — T50905A Adverse effect of unspecified drugs, medicaments and biological substances, initial encounter: Secondary | ICD-10-CM

## 2013-12-18 DIAGNOSIS — C8589 Other specified types of non-Hodgkin lymphoma, extranodal and solid organ sites: Secondary | ICD-10-CM

## 2013-12-18 DIAGNOSIS — D6959 Other secondary thrombocytopenia: Secondary | ICD-10-CM

## 2013-12-18 DIAGNOSIS — R609 Edema, unspecified: Secondary | ICD-10-CM

## 2013-12-18 DIAGNOSIS — G8929 Other chronic pain: Secondary | ICD-10-CM

## 2013-12-18 DIAGNOSIS — D630 Anemia in neoplastic disease: Secondary | ICD-10-CM

## 2013-12-18 LAB — COMPREHENSIVE METABOLIC PANEL (CC13)
ALBUMIN: 3.5 g/dL (ref 3.5–5.0)
ALT: 35 U/L (ref 0–55)
ANION GAP: 9 meq/L (ref 3–11)
AST: 54 U/L — ABNORMAL HIGH (ref 5–34)
Alkaline Phosphatase: 123 U/L (ref 40–150)
BUN: 18.3 mg/dL (ref 7.0–26.0)
CHLORIDE: 101 meq/L (ref 98–109)
CO2: 30 meq/L — AB (ref 22–29)
Calcium: 9.1 mg/dL (ref 8.4–10.4)
Creatinine: 1 mg/dL (ref 0.6–1.1)
Glucose: 107 mg/dl (ref 70–140)
POTASSIUM: 3.8 meq/L (ref 3.5–5.1)
Sodium: 139 mEq/L (ref 136–145)
TOTAL PROTEIN: 6 g/dL — AB (ref 6.4–8.3)
Total Bilirubin: 1.5 mg/dL — ABNORMAL HIGH (ref 0.20–1.20)

## 2013-12-18 LAB — CBC WITH DIFFERENTIAL/PLATELET
BASO%: 0.2 % (ref 0.0–2.0)
Basophils Absolute: 0 10*3/uL (ref 0.0–0.1)
EOS%: 0.8 % (ref 0.0–7.0)
Eosinophils Absolute: 0 10*3/uL (ref 0.0–0.5)
HCT: 29.6 % — ABNORMAL LOW (ref 34.8–46.6)
HGB: 9.5 g/dL — ABNORMAL LOW (ref 11.6–15.9)
LYMPH#: 2.9 10*3/uL (ref 0.9–3.3)
LYMPH%: 61.8 % — ABNORMAL HIGH (ref 14.0–49.7)
MCH: 30 pg (ref 25.1–34.0)
MCHC: 32 g/dL (ref 31.5–36.0)
MCV: 93.7 fL (ref 79.5–101.0)
MONO#: 0.2 10*3/uL (ref 0.1–0.9)
MONO%: 4.4 % (ref 0.0–14.0)
NEUT#: 1.5 10*3/uL (ref 1.5–6.5)
NEUT%: 32.8 % — ABNORMAL LOW (ref 38.4–76.8)
Platelets: 93 10*3/uL — ABNORMAL LOW (ref 145–400)
RBC: 3.16 10*6/uL — ABNORMAL LOW (ref 3.70–5.45)
RDW: 19.9 % — ABNORMAL HIGH (ref 11.2–14.5)
WBC: 4.7 10*3/uL (ref 3.9–10.3)

## 2013-12-18 LAB — TECHNOLOGIST REVIEW

## 2013-12-18 NOTE — Assessment & Plan Note (Signed)
This is likely due to recent treatment. The patient denies recent history of bleeding such as epistaxis, hematuria or hematochezia. She is asymptomatic from the low platelet count. I will observe for now.  she does not require transfusion now. I will continue the chemotherapy at current dose without dosage adjustment.  If the thrombocytopenia gets progressive worse in the future, I might have to delay her treatment or adjust the chemotherapy dose.   

## 2013-12-18 NOTE — Assessment & Plan Note (Signed)
She has recurrent sinusitis and headache. I recommend ENT referral and she agreed. Clinically, she does not look like she has infection.

## 2013-12-18 NOTE — Assessment & Plan Note (Signed)
The cause is unclear.  Echocardiogram showed no evidence of congestive heart failure. I suspect this is due to poor venous circulation and possibly diastolic failure. In the meantime, she will continue on furosemide daily. She has lost 6 pounds of weight since I saw her last month. Hopefully, with reducing the dose of prednisone, this will continue to improve.

## 2013-12-18 NOTE — Progress Notes (Signed)
[place] OFFICE PROGRESS NOTE  Patient Care Team: Madelyn Brunner, MD as PCP - General (Unknown Physician Specialty) Heath Lark, MD as Consulting Physician (Hematology and Oncology) Rollene Rotunda, MD as Consulting Physician (Hematology and Oncology)  SUMMARY OF ONCOLOGIC HISTORY: She was found to have anemia and was placed on observation. The cause of the anemia was thought to be related to chronic kidney disease. The patient also has history of Roux-en-Y gastric bypass and it was thought to be the contributing factor. 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 On 04/09/2013, she was started on prednisone 60 mg daily and methotrexate 7.5 mg once a week by mouth On 04/16/2013, she developed acute renal failure with hyperkalemia. Prednisone dose was reduced to 40 mg a day and methotrexate was placed on hold On 04/23/2013, acute renal failure was improving. The prednisone dose was reduced to 20 mg a day and would resume methotrexate at 7.5 mg once a week. On 04/27/2013, I reduced methotrexate to 5 mg once a week and prednisone dose was reduced to 10 mg alternate with 20 mg On 05/04/2013, I order one unit of blood transfusion. I The prednisone dose the same but reduced methotrexate to 2.5 mg once a week On 05/11/2013, I started to reduce prednisone dose further to 10 mg daily but to keep methotrexate at 2.5 mg once a week On 05/25/2013, I further reduce prednisone to 10 mg alternate with 7.5 mg and keep methotrexate at 2.5 mg once a week On 06/09/2013, and I further reduce prednisone to 5 mg daily and keep methotrexate at 2.5 mg once a week On 08/14/2013, I increase the methotrexate to 5 mg once a week and Prednisone at 5 mg daily. On 09/10/2013, I reduced prednisone to 5 mg alternate with 2.5 mg and keep methotrexate at 5 mg once a week. On 10/10/2013, I increased the methotrexate to 10 mg once a week and  The prednisone at 5 mg alternate with 2.5 mg. On 10/23/2013, she saw a hematologist at [facility] for second opinion. Further recommendations are pending. On 12/18/13: prednisone is tapered to 2.5mg  daily. Methotrexate at 10 mg per week.  INTERVAL HISTORY: Please see below for problem oriented charting. She continues to complain of sinus headache and congestion. Denies fevers or chills. She continues to have bilateral lower extremity edema. She has lost 6 pounds of fluid weight. Denies recent infection.  REVIEW OF SYSTEMS:   Constitutional: Denies fevers, chills . Eyes: Denies blurriness of vision Ears, nose, mouth, throat, and face: Denies mucositis or sore throat Respiratory: Denies cough, dyspnea or wheezes Cardiovascular: Denies palpitation, chest discomfort  Gastrointestinal:  Denies nausea, heartburn or change in bowel habits Skin: Denies abnormal skin rashes Lymphatics: Denies new lymphadenopathy or easy bruising Neurological:Denies numbness, tingling or new weaknesses Behavioral/Psych: Mood is stable, no new changes  All other systems were reviewed with the patient and are negative.  I have reviewed the past medical history, past surgical history, social history and family history with the patient and they are unchanged from previous note.  ALLERGIES:  is allergic to latex; penicillins; and sulfonamide derivatives.  MEDICATIONS:  Current Outpatient Prescriptions  Medication Sig Dispense Refill  . acetaminophen-codeine (TYLENOL #3) 300-30 MG per tablet Take 1 tablet by mouth 4 (four) times daily as needed for pain.       Marland Kitchen allopurinol (ZYLOPRIM) 300 MG tablet Take 300 mg by mouth every morning.       Marland Kitchen  butalbital-acetaminophen-caffeine (FIORICET, ESGIC) 50-325-40 MG per tablet Take 1 tablet by mouth every 6 (six) hours as needed for headache.  90 tablet  0  . Calcium Carbonate-Vitamin D (CALCIUM 600+D) 600-400 MG-UNIT per tablet Take 1 tablet by mouth daily.      .  cholecalciferol (VITAMIN D) 1000 UNITS tablet Take 1,000 Units by mouth 2 (two) times daily.      . citalopram (CELEXA) 20 MG tablet Take 20 mg by mouth every morning.       . Cyanocobalamin (VITAMIN B-12 SL) Place 1 tablet under the tongue daily.      . fluticasone (FLONASE) 50 MCG/ACT nasal spray Place 3 sprays into the nose daily.      . folic acid (FOLVITE) 1 MG tablet Take 1 tablet (1 mg total) by mouth daily.  90 tablet  3  . furosemide (LASIX) 40 MG tablet Take 1 tablet (40 mg total) by mouth daily.  30 tablet  3  . levothyroxine (SYNTHROID, LEVOTHROID) 75 MCG tablet Take 75 mcg by mouth daily before breakfast.       . methotrexate (RHEUMATREX) 2.5 MG tablet Take 4 tablets (10 mg total) by mouth once a week. Caution:Chemotherapy. Protect from light.  48 tablet  0  . metoprolol tartrate (LOPRESSOR) 25 MG tablet Take 25 mg by mouth 2 (two) times daily.        . Multiple Vitamin (MULTIVITAMIN WITH MINERALS) TABS Take 1 tablet by mouth daily. Centrum      . ondansetron (ZOFRAN) 8 MG tablet Take 1 tablet (8 mg total) by mouth every 8 (eight) hours as needed for nausea.  30 tablet  3  . oxybutynin (DITROPAN) 5 MG tablet Take 5 mg by mouth Daily.      . Potassium 99 MG TABS Take by mouth.      . predniSONE (DELTASONE) 5 MG tablet Take 5 mg by mouth daily with breakfast. Mon Wed And Fridays 5 mg and 2.5 mg the rest of the week.       No current facility-administered medications for this visit.    PHYSICAL EXAMINATION: ECOG PERFORMANCE STATUS: 1 - Symptomatic but completely ambulatory  Filed Vitals:   12/18/13 0820  BP: 102/65  Pulse: 57  Temp: 98.5 F (36.9 C)  Resp: 18   Filed Weights   12/18/13 0820  Weight: 210 lb 6.4 oz (95.437 kg)    GENERAL:alert, no distress and comfortable. Should she Neu SKIN: skin color, texture, turgor are normal, no rashes or significant lesions EYES: normal, Conjunctiva are pink and non-injected, sclera clear OROPHARYNX:no exudate, no erythema and  lips, buccal mucosa, and tongue normal  NECK: supple, thyroid normal size, non-tender, without nodularity LYMPH:  no palpable lymphadenopathy in the cervical, axillary or inguinal LUNGS: clear to auscultation and percussion with normal breathing effort HEART: regular rate & rhythm and no murmurs with severe bilateral lower extremity edema ABDOMEN:abdomen soft, non-tender and normal bowel sounds Musculoskeletal:no cyanosis of digits and no clubbing  NEURO: alert & oriented x 3 with fluent speech, no focal motor/sensory deficits  LABORATORY DATA:  I have reviewed the data as listed    Component Value Date/Time   NA 139 12/18/2013 0809   NA 144 08/02/2012 1500   K 3.8 12/18/2013 0809   K 4.0 08/02/2012 1500   CL 105 10/25/2012 1135   CL 103 08/02/2012 1500   CO2 30* 12/18/2013 0809   CO2 28 08/02/2012 1500   GLUCOSE 107 12/18/2013 0809   GLUCOSE 85 10/25/2012  1135   GLUCOSE 76 08/02/2012 1500   BUN 18.3 12/18/2013 0809   BUN 15 08/02/2012 1500   CREATININE 1.0 12/18/2013 0809   CREATININE 0.94 08/02/2012 1500   CREATININE 1.22* 09/08/2011 1120   CALCIUM 9.1 12/18/2013 0809   CALCIUM 10.3 08/02/2012 1500   PROT 6.0* 12/18/2013 0809   PROT 7.9 08/02/2012 1500   ALBUMIN 3.5 12/18/2013 0809   ALBUMIN 4.1 08/02/2012 1500   AST 54* 12/18/2013 0809   AST 29 08/02/2012 1500   ALT 35 12/18/2013 0809   ALT 38* 08/02/2012 1500   ALKPHOS 123 12/18/2013 0809   ALKPHOS 151* 08/02/2012 1500   BILITOT 1.50* 12/18/2013 0809   BILITOT 0.3 08/02/2012 1500   GFRNONAA 61* 08/02/2012 1500   GFRAA 70* 08/02/2012 1500    No results found for this basename: SPEP,  UPEP,   kappa and lambda light chains    Lab Results  Component Value Date   WBC 4.7 12/18/2013   NEUTROABS 1.5 12/18/2013   HGB 9.5* 12/18/2013   HCT 29.6* 12/18/2013   MCV 93.7 12/18/2013   PLT 93* 12/18/2013      Chemistry      Component Value Date/Time   NA 139 12/18/2013 0809   NA 144 08/02/2012 1500   K 3.8 12/18/2013 0809   K 4.0 08/02/2012 1500   CL 105  10/25/2012 1135   CL 103 08/02/2012 1500   CO2 30* 12/18/2013 0809   CO2 28 08/02/2012 1500   BUN 18.3 12/18/2013 0809   BUN 15 08/02/2012 1500   CREATININE 1.0 12/18/2013 0809   CREATININE 0.94 08/02/2012 1500   CREATININE 1.22* 09/08/2011 1120      Component Value Date/Time   CALCIUM 9.1 12/18/2013 0809   CALCIUM 10.3 08/02/2012 1500   ALKPHOS 123 12/18/2013 0809   ALKPHOS 151* 08/02/2012 1500   AST 54* 12/18/2013 0809   AST 29 08/02/2012 1500   ALT 35 12/18/2013 0809   ALT 38* 08/02/2012 1500   BILITOT 1.50* 12/18/2013 0809   BILITOT 0.3 08/02/2012 1500      ASSESSMENT & PLAN:  Large granular lymphocytic leukemia I am seeing improvement of her total white blood cell count which had become normal. She is no longer neutropenic. I reviewed guidelines from [facility]. The patient is comfortable to remain on methotrexate 10 mg once a week. I would continue to initiate prednisone taper to 2.5 mg daily and to reduce it further at the end of next month to 4 days a week.  Weight loss Majority of the weight loss is due to diuretic therapy. I continue to encourage the patient to increase oral intake as tolerated.  Thrombocytopenia due to drugs This is likely due to recent treatment. The patient denies recent history of bleeding such as epistaxis, hematuria or hematochezia. She is asymptomatic from the low platelet count. I will observe for now.  she does not require transfusion now. I will continue the chemotherapy at current dose without dosage adjustment.  If the thrombocytopenia gets progressive worse in the future, I might have to delay her treatment or adjust the chemotherapy dose.      Anemia in neoplastic disease This is likely anemia of chronic disease. The patient denies recent history of bleeding such as epistaxis, hematuria or hematochezia. She is asymptomatic from the anemia. We will observe for now.  She does not require transfusion now.      Sinus congestion She has recurrent  sinusitis and headache. I recommend ENT referral and  she agreed. Clinically, she does not look like she has infection.  EDEMA The cause is unclear.  Echocardiogram showed no evidence of congestive heart failure. I suspect this is due to poor venous circulation and possibly diastolic failure. In the meantime, she will continue on furosemide daily. She has lost 6 pounds of weight since I saw her last month. Hopefully, with reducing the dose of prednisone, this will continue to improve.     Orders Placed This Encounter  Procedures  . Ambulatory referral to ENT    Referral Priority:  Routine    Referral Type:  Consultation    Referral Reason:  Specialty Services Required    Referred to Provider:  Rozetta Nunnery, MD    Requested Specialty:  Otolaryngology    Number of Visits Requested:  1   All questions were answered. The patient knows to call the clinic with any problems, questions or concerns. No barriers to learning was detected. I spent 40 minutes counseling the patient face to face. The total time spent in the appointment was 55 minutes and more than 50% was on counseling and review of test results     [facility], [place], MD 12/18/2013 9:46 AM

## 2013-12-18 NOTE — Assessment & Plan Note (Signed)
This is likely anemia of chronic disease. The patient denies recent history of bleeding such as epistaxis, hematuria or hematochezia. She is asymptomatic from the anemia. We will observe for now.  She does not require transfusion now.   

## 2013-12-18 NOTE — Telephone Encounter (Signed)
Pt confirmed labs/ov per 07/28 POF, gave pt AVS and scheduled referral w/ENT....Cherylann Banas

## 2013-12-18 NOTE — Telephone Encounter (Signed)
Message copied by Patton Salles on Tue Dec 18, 2013 10:27 AM ------      Message from: [facility], [place]: Tue Dec 18, 2013 10:10 AM      Regarding: elevated creatinine       Tell her to reduce lasix to 20mg  alt 40 mg      Recheck labs in 2 weeks; add lab appt please      ----- Message -----         From: [facility]         Sent: 12/18/2013   8:22 AM           To: Heath Lark, MD                   ------

## 2013-12-18 NOTE — Assessment & Plan Note (Signed)
I am seeing improvement of her total white blood cell count which had become normal. She is no longer neutropenic. I reviewed guidelines from [facility]. The patient is comfortable to remain on methotrexate 10 mg once a week. I would continue to initiate prednisone taper to 2.5 mg daily and to reduce it further at the end of next month to 4 days a week.

## 2013-12-18 NOTE — Telephone Encounter (Signed)
Pt notified of message below. Verbalized understanding 

## 2013-12-18 NOTE — Assessment & Plan Note (Signed)
Majority of the weight loss is due to diuretic therapy. I continue to encourage the patient to increase oral intake as tolerated.

## 2014-01-21 ENCOUNTER — Telehealth: Payer: Self-pay | Admitting: *Deleted

## 2014-01-21 ENCOUNTER — Ambulatory Visit (HOSPITAL_BASED_OUTPATIENT_CLINIC_OR_DEPARTMENT_OTHER): Payer: Medicare Other | Admitting: Hematology and Oncology

## 2014-01-21 ENCOUNTER — Encounter: Payer: Self-pay | Admitting: Hematology and Oncology

## 2014-01-21 ENCOUNTER — Other Ambulatory Visit (HOSPITAL_BASED_OUTPATIENT_CLINIC_OR_DEPARTMENT_OTHER): Payer: Medicare Other

## 2014-01-21 ENCOUNTER — Telehealth: Payer: Self-pay | Admitting: Hematology and Oncology

## 2014-01-21 VITALS — BP 107/48 | HR 61 | Temp 98.5°F | Resp 18 | Ht 62.0 in | Wt 215.0 lb

## 2014-01-21 DIAGNOSIS — R740 Nonspecific elevation of levels of transaminase and lactic acid dehydrogenase [LDH]: Secondary | ICD-10-CM

## 2014-01-21 DIAGNOSIS — R51 Headache: Secondary | ICD-10-CM

## 2014-01-21 DIAGNOSIS — R7402 Elevation of levels of lactic acid dehydrogenase (LDH): Secondary | ICD-10-CM

## 2014-01-21 DIAGNOSIS — R7401 Elevation of levels of liver transaminase levels: Secondary | ICD-10-CM

## 2014-01-21 DIAGNOSIS — D630 Anemia in neoplastic disease: Secondary | ICD-10-CM

## 2014-01-21 DIAGNOSIS — D6959 Other secondary thrombocytopenia: Secondary | ICD-10-CM

## 2014-01-21 DIAGNOSIS — T50905A Adverse effect of unspecified drugs, medicaments and biological substances, initial encounter: Secondary | ICD-10-CM

## 2014-01-21 DIAGNOSIS — R519 Headache, unspecified: Secondary | ICD-10-CM

## 2014-01-21 DIAGNOSIS — C91Z0 Other lymphoid leukemia not having achieved remission: Secondary | ICD-10-CM

## 2014-01-21 DIAGNOSIS — G44029 Chronic cluster headache, not intractable: Secondary | ICD-10-CM

## 2014-01-21 DIAGNOSIS — R609 Edema, unspecified: Secondary | ICD-10-CM

## 2014-01-21 DIAGNOSIS — E876 Hypokalemia: Secondary | ICD-10-CM

## 2014-01-21 LAB — CBC WITH DIFFERENTIAL/PLATELET
BASO%: 0.9 % (ref 0.0–2.0)
BASOS ABS: 0 10*3/uL (ref 0.0–0.1)
EOS%: 0.8 % (ref 0.0–7.0)
Eosinophils Absolute: 0 10*3/uL (ref 0.0–0.5)
HEMATOCRIT: 27 % — AB (ref 34.8–46.6)
HEMOGLOBIN: 8.5 g/dL — AB (ref 11.6–15.9)
LYMPH%: 69.1 % — ABNORMAL HIGH (ref 14.0–49.7)
MCH: 29.7 pg (ref 25.1–34.0)
MCHC: 31.5 g/dL (ref 31.5–36.0)
MCV: 94.2 fL (ref 79.5–101.0)
MONO#: 0.1 10*3/uL (ref 0.1–0.9)
MONO%: 3.4 % (ref 0.0–14.0)
NEUT#: 1.1 10*3/uL — ABNORMAL LOW (ref 1.5–6.5)
NEUT%: 25.8 % — AB (ref 38.4–76.8)
PLATELETS: 72 10*3/uL — AB (ref 145–400)
RBC: 2.87 10*6/uL — ABNORMAL LOW (ref 3.70–5.45)
RDW: 21.7 % — AB (ref 11.2–14.5)
WBC: 4.4 10*3/uL (ref 3.9–10.3)
lymph#: 3 10*3/uL (ref 0.9–3.3)

## 2014-01-21 LAB — COMPREHENSIVE METABOLIC PANEL (CC13)
ALK PHOS: 134 U/L (ref 40–150)
ALT: 34 U/L (ref 0–55)
AST: 60 U/L — ABNORMAL HIGH (ref 5–34)
Albumin: 3.4 g/dL — ABNORMAL LOW (ref 3.5–5.0)
Anion Gap: 11 mEq/L (ref 3–11)
BUN: 12 mg/dL (ref 7.0–26.0)
CALCIUM: 9.2 mg/dL (ref 8.4–10.4)
CHLORIDE: 105 meq/L (ref 98–109)
CO2: 27 mEq/L (ref 22–29)
Creatinine: 0.9 mg/dL (ref 0.6–1.1)
Glucose: 96 mg/dl (ref 70–140)
Potassium: 3.3 mEq/L — ABNORMAL LOW (ref 3.5–5.1)
Sodium: 143 mEq/L (ref 136–145)
Total Bilirubin: 2.24 mg/dL — ABNORMAL HIGH (ref 0.20–1.20)
Total Protein: 5.6 g/dL — ABNORMAL LOW (ref 6.4–8.3)

## 2014-01-21 LAB — TECHNOLOGIST REVIEW

## 2014-01-21 MED ORDER — BUTALBITAL-APAP-CAFFEINE 50-325-40 MG PO TABS: 1.0000 | ORAL_TABLET | Freq: Four times a day (QID) | ORAL | Status: DC | PRN

## 2014-01-21 MED ORDER — METHOTREXATE 2.5 MG PO TABS: 10.0000 mg | ORAL_TABLET | ORAL | Status: DC

## 2014-01-21 NOTE — Assessment & Plan Note (Signed)
The cause is unclear.  Echocardiogram showed no evidence of congestive heart failure. I suspect this is due to poor venous circulation and possibly diastolic failure. In the meantime, she will continue on furosemide daily. Hopefully, with reducing the dose of prednisone, this will continue to improve. I recommend referral to a vein center for ACE wraps for her lower extremities.

## 2014-01-21 NOTE — Assessment & Plan Note (Signed)
This is related to Lasix. I recommend potassium rich diet.

## 2014-01-21 NOTE — Assessment & Plan Note (Signed)
She denies alcohol intake. I recommend she hold methotrexate next week, resume on 9/14 and then whole again until further notice when I see her back.

## 2014-01-21 NOTE — Assessment & Plan Note (Signed)
This is likely anemia of chronic disease. The patient denies recent history of bleeding such as epistaxis, hematuria or hematochezia. She is asymptomatic from the anemia. We will observe for now.  She does not require transfusion now.  I recommend continue vitamin D63 and folic acid supplement.

## 2014-01-21 NOTE — Telephone Encounter (Signed)
gv and printed appt sched adn avs for pt for Sept °

## 2014-01-21 NOTE — Progress Notes (Signed)
[place] OFFICE PROGRESS NOTE  Patient Care Team: Madelyn Brunner, MD as PCP - General (Unknown Physician Specialty) Heath Lark, MD as Consulting Physician (Hematology and Oncology) Rollene Rotunda, MD as Consulting Physician (Hematology and Oncology)  SUMMARY OF ONCOLOGIC HISTORY: She was found to have anemia and was placed on observation. The cause of the anemia was thought to be related to chronic kidney disease. The patient also has history of Roux-en-Y gastric bypass and it was thought to be the contributing factor. 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 On 04/09/2013, she was started on prednisone 60 mg daily and methotrexate 7.5 mg once a week by mouth On 04/16/2013, she developed acute renal failure with hyperkalemia. Prednisone dose was reduced to 40 mg a day and methotrexate was placed on hold On 04/23/2013, acute renal failure was improving. The prednisone dose was reduced to 20 mg a day and would resume methotrexate at 7.5 mg once a week. On 04/27/2013, I reduced methotrexate to 5 mg once a week and prednisone dose was reduced to 10 mg alternate with 20 mg On 05/04/2013, I order one unit of blood transfusion. I The prednisone dose the same but reduced methotrexate to 2.5 mg once a week On 05/11/2013, I started to reduce prednisone dose further to 10 mg daily but to keep methotrexate at 2.5 mg once a week On 05/25/2013, I further reduce prednisone to 10 mg alternate with 7.5 mg and keep methotrexate at 2.5 mg once a week On 06/09/2013, and I further reduce prednisone to 5 mg daily and keep methotrexate at 2.5 mg once a week On 08/14/2013, I increase the methotrexate to 5 mg once a week and Prednisone at 5 mg daily. On 09/10/2013, I reduced prednisone to 5 mg alternate with 2.5 mg and keep methotrexate at 5 mg once a week. On 10/10/2013, I increased the methotrexate to 10 mg once a week and  The prednisone at 5 mg alternate with 2.5 mg. On 10/23/2013, she saw a hematologist at [facility] for second opinion. Further recommendations are pending. On 12/18/13: prednisone is tapered to 2.5mg  daily. Methotrexate at 10 mg per week. On 01/21/14: Prednisone was discontinued. She remained on methotrexate 10 mg per week.  INTERVAL HISTORY: Please see below for problem oriented charting. She complains of bilateral leg edema. She has excellent energy level. She complained of chronic headaches from sinusitis.  REVIEW OF SYSTEMS:   Constitutional: Denies fevers, chills or abnormal weight loss Eyes: Denies blurriness of vision Ears, nose, mouth, throat, and face: Denies mucositis or sore throat Respiratory: Denies cough, dyspnea or wheezes Cardiovascular: Denies palpitation, chest discomfort  Gastrointestinal:  Denies nausea, heartburn or change in bowel habits Skin: Denies abnormal skin rashes Lymphatics: Denies new lymphadenopathy or easy bruising Neurological:Denies numbness, tingling or new weaknesses Behavioral/Psych: Mood is stable, no new changes  All other systems were reviewed with the patient and are negative.  I have reviewed the past medical history, past surgical history, social history and family history with the patient and they are unchanged from previous note.  ALLERGIES:  is allergic to latex; penicillins; and sulfonamide derivatives.  MEDICATIONS:  Current Outpatient Prescriptions  Medication Sig Dispense Refill  . acetaminophen-codeine (TYLENOL #3) 300-30 MG per tablet Take 1 tablet by mouth 4 (four) times daily as needed for pain.       Marland Kitchen allopurinol (ZYLOPRIM) 300 MG tablet Take 300 mg by mouth every morning.       Marland Kitchen  butalbital-acetaminophen-caffeine (FIORICET, ESGIC) 50-325-40 MG per tablet Take 1 tablet by mouth every 6 (six) hours as needed for headache.  90 tablet  0  . Calcium Carbonate-Vitamin D (CALCIUM 600+D) 600-400 MG-UNIT per tablet Take 1 tablet by  mouth daily.      . cholecalciferol (VITAMIN D) 1000 UNITS tablet Take 1,000 Units by mouth 2 (two) times daily.      . citalopram (CELEXA) 20 MG tablet Take 20 mg by mouth every morning.       . Cyanocobalamin (VITAMIN B-12 SL) Place 1 tablet under the tongue daily.      . fluticasone (FLONASE) 50 MCG/ACT nasal spray Place 3 sprays into the nose daily.      . folic acid (FOLVITE) 1 MG tablet Take 1 tablet (1 mg total) by mouth daily.  90 tablet  3  . furosemide (LASIX) 40 MG tablet Take 1 tablet (40 mg total) by mouth daily.  30 tablet  3  . levothyroxine (SYNTHROID, LEVOTHROID) 75 MCG tablet Take 75 mcg by mouth daily before breakfast.       . methotrexate (RHEUMATREX) 2.5 MG tablet Take 4 tablets (10 mg total) by mouth once a week. Caution:Chemotherapy. Protect from light.  48 tablet  0  . metoprolol tartrate (LOPRESSOR) 25 MG tablet Take 25 mg by mouth 2 (two) times daily.        . Multiple Vitamin (MULTIVITAMIN WITH MINERALS) TABS Take 1 tablet by mouth daily. Centrum      . ondansetron (ZOFRAN) 8 MG tablet Take 1 tablet (8 mg total) by mouth every 8 (eight) hours as needed for nausea.  30 tablet  3  . oxybutynin (DITROPAN) 5 MG tablet Take 5 mg by mouth Daily.      . Potassium 99 MG TABS Take by mouth.      . predniSONE (DELTASONE) 5 MG tablet Take 5 mg by mouth daily with breakfast. Mon Wed And Fridays 5 mg and 2.5 mg the rest of the week.       No current facility-administered medications for this visit.    PHYSICAL EXAMINATION: ECOG PERFORMANCE STATUS: 1 - Symptomatic but completely ambulatory  Filed Vitals:   01/21/14 0821  BP: 107/48  Pulse: 61  Temp: 98.5 F (36.9 C)  Resp: 18   Filed Weights   01/21/14 0821  Weight: 215 lb (97.523 kg)    GENERAL:alert, no distress and comfortable. She is morbidly obese SKIN: Mild skin erythema on both anterior shin. No cellulitis. EYES: normal, Conjunctiva are pink and non-injected, sclera clear HEART: regular rate & rhythm and no  murmurs were bilateral severe lower extremity edema Musculoskeletal:no cyanosis of digits and no clubbing  NEURO: alert & oriented x 3 with fluent speech, no focal motor/sensory deficits  LABORATORY DATA:  I have reviewed the data as listed    Component Value Date/Time   NA 143 01/21/2014 0808   NA 144 08/02/2012 1500   K 3.3* 01/21/2014 0808   K 4.0 08/02/2012 1500   CL 105 10/25/2012 1135   CL 103 08/02/2012 1500   CO2 27 01/21/2014 0808   CO2 28 08/02/2012 1500   GLUCOSE 96 01/21/2014 0808   GLUCOSE 85 10/25/2012 1135   GLUCOSE 76 08/02/2012 1500   BUN 12.0 01/21/2014 0808   BUN 15 08/02/2012 1500   CREATININE 0.9 01/21/2014 0808   CREATININE 0.94 08/02/2012 1500   CREATININE 1.22* 09/08/2011 1120   CALCIUM 9.2 01/21/2014 0808   CALCIUM 10.3 08/02/2012 1500  PROT 5.6* 01/21/2014 0808   PROT 7.9 08/02/2012 1500   ALBUMIN 3.4* 01/21/2014 0808   ALBUMIN 4.1 08/02/2012 1500   AST 60* 01/21/2014 0808   AST 29 08/02/2012 1500   ALT 34 01/21/2014 0808   ALT 38* 08/02/2012 1500   ALKPHOS 134 01/21/2014 0808   ALKPHOS 151* 08/02/2012 1500   BILITOT 2.24* 01/21/2014 0808   BILITOT 0.3 08/02/2012 1500   GFRNONAA 61* 08/02/2012 1500   GFRAA 70* 08/02/2012 1500    No results found for this basename: SPEP,  UPEP,   kappa and lambda light chains    Lab Results  Component Value Date   WBC 4.4 01/21/2014   NEUTROABS 1.1* 01/21/2014   HGB 8.5* 01/21/2014   HCT 27.0* 01/21/2014   MCV 94.2 01/21/2014   PLT 72* 01/21/2014      Chemistry      Component Value Date/Time   NA 143 01/21/2014 0808   NA 144 08/02/2012 1500   K 3.3* 01/21/2014 0808   K 4.0 08/02/2012 1500   CL 105 10/25/2012 1135   CL 103 08/02/2012 1500   CO2 27 01/21/2014 0808   CO2 28 08/02/2012 1500   BUN 12.0 01/21/2014 0808   BUN 15 08/02/2012 1500   CREATININE 0.9 01/21/2014 0808   CREATININE 0.94 08/02/2012 1500   CREATININE 1.22* 09/08/2011 1120      Component Value Date/Time   CALCIUM 9.2 01/21/2014 0808   CALCIUM 10.3 08/02/2012 1500   ALKPHOS  134 01/21/2014 0808   ALKPHOS 151* 08/02/2012 1500   AST 60* 01/21/2014 0808   AST 29 08/02/2012 1500   ALT 34 01/21/2014 0808   ALT 38* 08/02/2012 1500   BILITOT 2.24* 01/21/2014 0808   BILITOT 0.3 08/02/2012 1500     ASSESSMENT & PLAN:  Large granular lymphocytic leukemia I am seeing improvement of her total white blood cell count which had become normal. She is no longer neutropenic.The patient is comfortable to remain on methotrexate 10 mg once a week. I told her to discontinue prednisone altogether.   Thrombocytopenia due to drugs This is likely due to recent treatment. The patient denies recent history of bleeding such as epistaxis, hematuria or hematochezia. She is asymptomatic from the low platelet count. I will observe for now.  she does not require transfusion now. I will continue the chemotherapy at current dose without dosage adjustment.  If the thrombocytopenia gets progressive worse in the future, I might have to delay her treatment or adjust the chemotherapy dose.        Headache I have given her prescription for Fioricet to take as needed. This is likely related to her chronic sinusitis.  EDEMA The cause is unclear.  Echocardiogram showed no evidence of congestive heart failure. I suspect this is due to poor venous circulation and possibly diastolic failure. In the meantime, she will continue on furosemide daily. Hopefully, with reducing the dose of prednisone, this will continue to improve. I recommend referral to a vein center for ACE wraps for her lower extremities.     Transaminitis She denies alcohol intake. I recommend she hold methotrexate next week, resume on 9/14 and then whole again until further notice when I see her back.  Hypokalemia This is related to Lasix. I recommend potassium rich diet.  Anemia in neoplastic disease This is likely anemia of chronic disease. The patient denies recent history of bleeding such as epistaxis, hematuria or  hematochezia. She is asymptomatic from the anemia. We will observe for  now.  She does not require transfusion now.  I recommend continue vitamin X77 and folic acid supplement.      Overall, she's not tolerating treatment well. If she failed to improve on methotrexate, we have to switch her treatment to something different.   Orders Placed This Encounter  Procedures  . Comprehensive metabolic panel    Standing Status: Future     Number of Occurrences:      Standing Expiration Date: 02/25/2015  . Hold Tube, Blood Bank    Standing Status: Future     Number of Occurrences:      Standing Expiration Date: 02/25/2015   All questions were answered. The patient knows to call the clinic with any problems, questions or concerns. No barriers to learning was detected. I spent 30 minutes counseling the patient face to face. The total time spent in the appointment was 40 minutes and more than 50% was on counseling and review of test results     [facility], [place], MD 01/21/2014 8:50 AM

## 2014-01-21 NOTE — Assessment & Plan Note (Signed)
I am seeing improvement of her total white blood cell count which had become normal. She is no longer neutropenic.The patient is comfortable to remain on methotrexate 10 mg once a week. I told her to discontinue prednisone altogether.

## 2014-01-21 NOTE — Assessment & Plan Note (Signed)
This is likely due to recent treatment. The patient denies recent history of bleeding such as epistaxis, hematuria or hematochezia. She is asymptomatic from the low platelet count. I will observe for now.  she does not require transfusion now. I will continue the chemotherapy at current dose without dosage adjustment.  If the thrombocytopenia gets progressive worse in the future, I might have to delay her treatment or adjust the chemotherapy dose.   

## 2014-01-21 NOTE — Assessment & Plan Note (Signed)
I have given her prescription for Fioricet to take as needed. This is likely related to her chronic sinusitis.

## 2014-01-21 NOTE — Telephone Encounter (Signed)
Per patient's request, faxed office notes to [facility]. Has appt 9/14

## 2014-02-08 ENCOUNTER — Telehealth: Payer: Self-pay | Admitting: *Deleted

## 2014-02-08 NOTE — Telephone Encounter (Signed)
Husband asks if ok for pt to get a flu shot?

## 2014-02-08 NOTE — Telephone Encounter (Signed)
Yes, OK 

## 2014-02-08 NOTE — Telephone Encounter (Signed)
Informed husband ok for pt to get flu shot and we can give her one on her visit next week 9/22.  He verbalized understanding and states she would like to have it next week when she comes in for office visit.

## 2014-02-11 ENCOUNTER — Other Ambulatory Visit: Payer: Self-pay | Admitting: Hematology and Oncology

## 2014-02-11 DIAGNOSIS — C91Z0 Other lymphoid leukemia not having achieved remission: Secondary | ICD-10-CM

## 2014-02-11 DIAGNOSIS — D61818 Other pancytopenia: Secondary | ICD-10-CM

## 2014-02-12 ENCOUNTER — Ambulatory Visit (HOSPITAL_BASED_OUTPATIENT_CLINIC_OR_DEPARTMENT_OTHER): Payer: Medicare Other

## 2014-02-12 ENCOUNTER — Ambulatory Visit (HOSPITAL_COMMUNITY)
Admission: RE | Admit: 2014-02-12 | Discharge: 2014-02-12 | Disposition: A | Discharge status: Home / Self Care | Payer: Medicare Other | Source: Ambulatory Visit | Attending: Hematology and Oncology | Admitting: Hematology and Oncology

## 2014-02-12 ENCOUNTER — Telehealth: Payer: Self-pay | Admitting: Hematology and Oncology

## 2014-02-12 ENCOUNTER — Ambulatory Visit (HOSPITAL_BASED_OUTPATIENT_CLINIC_OR_DEPARTMENT_OTHER): Payer: Medicare Other | Admitting: Hematology and Oncology

## 2014-02-12 ENCOUNTER — Other Ambulatory Visit (HOSPITAL_BASED_OUTPATIENT_CLINIC_OR_DEPARTMENT_OTHER): Payer: Medicare Other

## 2014-02-12 VITALS — BP 101/53 | HR 55 | Temp 98.5°F | Resp 18 | Ht 62.0 in | Wt 204.7 lb

## 2014-02-12 VITALS — BP 91/46 | HR 58 | Temp 98.4°F | Resp 18

## 2014-02-12 DIAGNOSIS — T50905A Adverse effect of unspecified drugs, medicaments and biological substances, initial encounter: Secondary | ICD-10-CM

## 2014-02-12 DIAGNOSIS — D630 Anemia in neoplastic disease: Secondary | ICD-10-CM

## 2014-02-12 DIAGNOSIS — C91Z0 Other lymphoid leukemia not having achieved remission: Secondary | ICD-10-CM

## 2014-02-12 DIAGNOSIS — D61818 Other pancytopenia: Secondary | ICD-10-CM

## 2014-02-12 DIAGNOSIS — R609 Edema, unspecified: Secondary | ICD-10-CM

## 2014-02-12 DIAGNOSIS — D6959 Other secondary thrombocytopenia: Secondary | ICD-10-CM

## 2014-02-12 DIAGNOSIS — T50904A Poisoning by unspecified drugs, medicaments and biological substances, undetermined, initial encounter: Secondary | ICD-10-CM

## 2014-02-12 LAB — CBC WITH DIFFERENTIAL/PLATELET
BASO%: 1.2 % (ref 0.0–2.0)
Basophils Absolute: 0.1 10*3/uL (ref 0.0–0.1)
EOS ABS: 0.1 10*3/uL (ref 0.0–0.5)
EOS%: 0.4 % (ref 0.0–7.0)
HCT: 25.4 % — ABNORMAL LOW (ref 34.8–46.6)
HGB: 8 g/dL — ABNORMAL LOW (ref 11.6–15.9)
LYMPH#: 10 10*3/uL — AB (ref 0.9–3.3)
LYMPH%: 83.9 % — ABNORMAL HIGH (ref 14.0–49.7)
MCH: 29.2 pg (ref 25.1–34.0)
MCHC: 31.4 g/dL — ABNORMAL LOW (ref 31.5–36.0)
MCV: 92.8 fL (ref 79.5–101.0)
MONO#: 0.2 10*3/uL (ref 0.1–0.9)
MONO%: 1.8 % (ref 0.0–14.0)
NEUT%: 12.7 % — AB (ref 38.4–76.8)
NEUTROS ABS: 1.5 10*3/uL (ref 1.5–6.5)
PLATELETS: 61 10*3/uL — AB (ref 145–400)
RBC: 2.73 10*6/uL — AB (ref 3.70–5.45)
RDW: 21.6 % — ABNORMAL HIGH (ref 11.2–14.5)
WBC: 11.9 10*3/uL — ABNORMAL HIGH (ref 3.9–10.3)

## 2014-02-12 LAB — LACTATE DEHYDROGENASE (CC13): LDH: 328 U/L — ABNORMAL HIGH (ref 125–245)

## 2014-02-12 LAB — COMPREHENSIVE METABOLIC PANEL (CC13)
ALT: 29 U/L (ref 0–55)
ANION GAP: 10 meq/L (ref 3–11)
AST: 67 U/L — ABNORMAL HIGH (ref 5–34)
Albumin: 3.1 g/dL — ABNORMAL LOW (ref 3.5–5.0)
Alkaline Phosphatase: 185 U/L — ABNORMAL HIGH (ref 40–150)
BUN: 20.9 mg/dL (ref 7.0–26.0)
CALCIUM: 9.3 mg/dL (ref 8.4–10.4)
CHLORIDE: 99 meq/L (ref 98–109)
CO2: 31 meq/L — AB (ref 22–29)
Creatinine: 1 mg/dL (ref 0.6–1.1)
Glucose: 102 mg/dl (ref 70–140)
Potassium: 3.4 mEq/L — ABNORMAL LOW (ref 3.5–5.1)
SODIUM: 140 meq/L (ref 136–145)
TOTAL PROTEIN: 5.4 g/dL — AB (ref 6.4–8.3)
Total Bilirubin: 2.82 mg/dL — ABNORMAL HIGH (ref 0.20–1.20)

## 2014-02-12 LAB — HOLD TUBE, BLOOD BANK

## 2014-02-12 LAB — BILIRUBIN, DIRECT: Bilirubin, Direct: 1.5 mg/dL — ABNORMAL HIGH (ref 0.0–0.3)

## 2014-02-12 LAB — TECHNOLOGIST REVIEW

## 2014-02-12 LAB — PREPARE RBC (CROSSMATCH)

## 2014-02-12 MED ORDER — SODIUM CHLORIDE 0.9 % IV SOLN
250.0000 mL | Freq: Once | INTRAVENOUS | Status: AC
  Administered 2014-02-12: 250 mL via INTRAVENOUS

## 2014-02-12 NOTE — Assessment & Plan Note (Signed)
Per [facility] recommendation, we discontinue prednisone and methotrexate completely. Her blood work is significantly worse and the patient had poor appetite, evidence of hemolysis, and weight loss and reduced energy level. She also had diffuse musculoskeletal pain. I recommend putting her back on 10 mg of prednisone daily, 10 mg of methotrexate once a week and she will return on a weekly basis to get blood drawn.

## 2014-02-12 NOTE — Assessment & Plan Note (Signed)
The cause of the anemia is due to bone marrow infiltration. We discussed some of the risks, benefits, and alternatives of blood transfusions. The patient is symptomatic from anemia and the hemoglobin level is critically low.  Some of the side-effects to be expected including risks of transfusion reactions, chills, infection, syndrome of volume overload and risk of hospitalization from various reasons and the patient is willing to proceed and went ahead to sign consent today.

## 2014-02-12 NOTE — Progress Notes (Signed)
[place] OFFICE PROGRESS NOTE  Patient Care Team: Madelyn Brunner, MD as PCP - General (Unknown Physician Specialty) Heath Lark, MD as Consulting Physician (Hematology and Oncology) Rollene Rotunda, MD as Consulting Physician (Hematology and Oncology)  SUMMARY OF ONCOLOGIC HISTORY: She was found to have anemia and was placed on observation. The cause of the anemia was thought to be related to chronic kidney disease. The patient also has history of Roux-en-Y gastric bypass and it was thought to be the contributing factor. 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 On 04/09/2013, she was started on prednisone 60 mg daily and methotrexate 7.5 mg once a week by mouth On 04/16/2013, she developed acute renal failure with hyperkalemia. Prednisone dose was reduced to 40 mg a day and methotrexate was placed on hold On 04/23/2013, acute renal failure was improving. The prednisone dose was reduced to 20 mg a day and would resume methotrexate at 7.5 mg once a week. On 04/27/2013, I reduced methotrexate to 5 mg once a week and prednisone dose was reduced to 10 mg alternate with 20 mg On 05/04/2013, I order one unit of blood transfusion. I The prednisone dose the same but reduced methotrexate to 2.5 mg once a week On 05/11/2013, I started to reduce prednisone dose further to 10 mg daily but to keep methotrexate at 2.5 mg once a week On 05/25/2013, I further reduce prednisone to 10 mg alternate with 7.5 mg and keep methotrexate at 2.5 mg once a week On 06/09/2013, and I further reduce prednisone to 5 mg daily and keep methotrexate at 2.5 mg once a week On 08/14/2013, I increase the methotrexate to 5 mg once a week and Prednisone at 5 mg daily. On 09/10/2013, I reduced prednisone to 5 mg alternate with 2.5 mg and keep methotrexate at 5 mg once a week. On 10/10/2013, I increased the methotrexate to 10 mg once a week and  The prednisone at 5 mg alternate with 2.5 mg. On 10/23/2013, she saw a hematologist at [facility] for second opinion. Further recommendations are pending. On 12/18/13: prednisone is tapered to 2.5mg  daily. Methotrexate at 10 mg per week. On 01/21/14: Prednisone was discontinued. She was taper off methotrexate.  INTERVAL HISTORY: Please see below for problem oriented charting. She complained of profound fatigue. She has shortness of breath on minimal exertion. She denies bleeding. Show poor appetite and complained of weakness. She has lost 10 pounds of weight. She was seen by the pain center and have Ace wrap swelling swelling. She complained of muscular aches and bone pain.  REVIEW OF SYSTEMS:   Constitutional: Denies fevers, chills  Eyes: Denies blurriness of vision Ears, nose, mouth, throat, and face: Denies mucositis or sore throat Cardiovascular: Denies palpitation, chest discomfort Gastrointestinal:  Denies nausea, heartburn or change in bowel habits Skin: Denies abnormal skin rashes Lymphatics: Denies new lymphadenopathy or easy bruising Neurological:Denies numbness, tingling or new weaknesses Behavioral/Psych: Mood is stable, no new changes  All other systems were reviewed with the patient and are negative.  I have reviewed the past medical history, past surgical history, social history and family history with the patient and they are unchanged from previous note.  ALLERGIES:  is allergic to latex; penicillins; and sulfonamide derivatives.  MEDICATIONS:  Current Outpatient Prescriptions  Medication Sig Dispense Refill  . acetaminophen-codeine (TYLENOL #3) 300-30 MG per tablet Take 1 tablet by mouth 4 (four) times daily as needed for pain.       Marland Kitchen  allopurinol (ZYLOPRIM) 300 MG tablet Take 300 mg by mouth every morning.       . butalbital-acetaminophen-caffeine (FIORICET, ESGIC) 50-325-40 MG per tablet Take 1 tablet by mouth every 6 (six) hours as needed for headache.  90  tablet  0  . Calcium Carbonate-Vitamin D (CALCIUM 600+D) 600-400 MG-UNIT per tablet Take 1 tablet by mouth daily.      . cholecalciferol (VITAMIN D) 1000 UNITS tablet Take 1,000 Units by mouth 2 (two) times daily.      . citalopram (CELEXA) 20 MG tablet Take 20 mg by mouth every morning.       . Cyanocobalamin (VITAMIN B-12 SL) Place 1 tablet under the tongue daily.      . fluticasone (FLONASE) 50 MCG/ACT nasal spray Place 3 sprays into the nose daily.      . folic acid (FOLVITE) 1 MG tablet Take 1 tablet (1 mg total) by mouth daily.  90 tablet  3  . furosemide (LASIX) 40 MG tablet Take 1 tablet (40 mg total) by mouth daily.  30 tablet  3  . levothyroxine (SYNTHROID, LEVOTHROID) 75 MCG tablet Take 75 mcg by mouth daily before breakfast.       . methotrexate (RHEUMATREX) 2.5 MG tablet Take 4 tablets (10 mg total) by mouth once a week. Caution:Chemotherapy. Protect from light.  48 tablet  0  . metoprolol tartrate (LOPRESSOR) 25 MG tablet Take 25 mg by mouth 2 (two) times daily.        . Multiple Vitamin (MULTIVITAMIN WITH MINERALS) TABS Take 1 tablet by mouth daily. Centrum      . ondansetron (ZOFRAN) 8 MG tablet Take 1 tablet (8 mg total) by mouth every 8 (eight) hours as needed for nausea.  30 tablet  3  . oxybutynin (DITROPAN) 5 MG tablet Take 5 mg by mouth Daily.      . Potassium 99 MG TABS Take by mouth.      . predniSONE (DELTASONE) 5 MG tablet Take 5 mg by mouth daily with breakfast. Mon Wed And Fridays 5 mg and 2.5 mg the rest of the week.       No current facility-administered medications for this visit.    PHYSICAL EXAMINATION: ECOG PERFORMANCE STATUS: 2 - Symptomatic, <50% confined to bed  Filed Vitals:   02/12/14 0830  BP: 101/53  Pulse: 55  Temp: 98.5 F (36.9 C)  Resp: 18   Filed Weights   02/12/14 0830  Weight: 204 lb 11.2 oz (92.851 kg)    GENERAL:alert, no distress and comfortable SKIN: skin color, texture, turgor are normal, no rashes or significant lesions EYES:  normal, Conjunctiva are pale and mildly jaundiced and non-injected, sclera clear OROPHARYNX:no exudate, no erythema and lips, buccal mucosa, and tongue normal  NECK: supple, thyroid normal size, non-tender, without nodularity LYMPH:  no palpable lymphadenopathy in the cervical, axillary or inguinal LUNGS: clear to auscultation and percussion with normal breathing effort HEART: regular rate & rhythm and no murmurs with moderate bilateral lower extremity edema ABDOMEN:abdomen soft, non-tender and normal bowel sounds Musculoskeletal:no cyanosis of digits and no clubbing  NEURO: alert & oriented x 3 with fluent speech, no focal motor/sensory deficits  LABORATORY DATA:  I have reviewed the data as listed    Component Value Date/Time   NA 140 02/12/2014 0807   NA 144 08/02/2012 1500   K 3.4* 02/12/2014 0807   K 4.0 08/02/2012 1500   CL 105 10/25/2012 1135   CL 103 08/02/2012 1500   CO2  31* 02/12/2014 0807   CO2 28 08/02/2012 1500   GLUCOSE 102 02/12/2014 0807   GLUCOSE 85 10/25/2012 1135   GLUCOSE 76 08/02/2012 1500   BUN 20.9 02/12/2014 0807   BUN 15 08/02/2012 1500   CREATININE 1.0 02/12/2014 0807   CREATININE 0.94 08/02/2012 1500   CREATININE 1.22* 09/08/2011 1120   CALCIUM 9.3 02/12/2014 0807   CALCIUM 10.3 08/02/2012 1500   PROT 5.4* 02/12/2014 0807   PROT 7.9 08/02/2012 1500   ALBUMIN 3.1* 02/12/2014 0807   ALBUMIN 4.1 08/02/2012 1500   AST 67* 02/12/2014 0807   AST 29 08/02/2012 1500   ALT 29 02/12/2014 0807   ALT 38* 08/02/2012 1500   ALKPHOS 185* 02/12/2014 0807   ALKPHOS 151* 08/02/2012 1500   BILITOT 2.82* 02/12/2014 0807   BILITOT 0.3 08/02/2012 1500   GFRNONAA 61* 08/02/2012 1500   GFRAA 70* 08/02/2012 1500    No results found for this basename: SPEP,  UPEP,   kappa and lambda light chains    Lab Results  Component Value Date   WBC 11.9* 02/12/2014   NEUTROABS 1.5 02/12/2014   HGB 8.0* 02/12/2014   HCT 25.4* 02/12/2014   MCV 92.8 02/12/2014   PLT 61* 02/12/2014      Chemistry       Component Value Date/Time   NA 140 02/12/2014 0807   NA 144 08/02/2012 1500   K 3.4* 02/12/2014 0807   K 4.0 08/02/2012 1500   CL 105 10/25/2012 1135   CL 103 08/02/2012 1500   CO2 31* 02/12/2014 0807   CO2 28 08/02/2012 1500   BUN 20.9 02/12/2014 0807   BUN 15 08/02/2012 1500   CREATININE 1.0 02/12/2014 0807   CREATININE 0.94 08/02/2012 1500   CREATININE 1.22* 09/08/2011 1120      Component Value Date/Time   CALCIUM 9.3 02/12/2014 0807   CALCIUM 10.3 08/02/2012 1500   ALKPHOS 185* 02/12/2014 0807   ALKPHOS 151* 08/02/2012 1500   AST 67* 02/12/2014 0807   AST 29 08/02/2012 1500   ALT 29 02/12/2014 0807   ALT 38* 08/02/2012 1500   BILITOT 2.82* 02/12/2014 0807   BILITOT 0.3 08/02/2012 1500     ASSESSMENT & PLAN:  Large granular lymphocytic leukemia Per [facility] recommendation, we discontinue prednisone and methotrexate completely. Her blood work is significantly worse and the patient had poor appetite, evidence of hemolysis, and weight loss and reduced energy level. She also had diffuse musculoskeletal pain. I recommend putting her back on 10 mg of prednisone daily, 10 mg of methotrexate once a week and she will return on a weekly basis to get blood drawn.  Thrombocytopenia due to drugs Since discontinuation of methotrexate, her platelet count is actually worse. I suspect this is due to bone marrow infiltration from LGL. She does not need transfusions of platelets now. We'll monitor her blood counts carefully.  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 We discussed some of the risks, benefits, and alternatives of blood transfusions. The patient is symptomatic from anemia and the hemoglobin level is critically low.  Some of the side-effects to be expected including risks of transfusion reactions, chills, infection, syndrome of volume overload and risk of hospitalization from various reasons and the patient is willing to proceed and went ahead to sign consent  today.   EDEMA The cause is unclear.  Echocardiogram showed no evidence of congestive heart failure. I suspect this is due to poor venous circulation and possibly  diastolic failure. In the meantime, she will continue on furosemide daily. I recommend referral to a vein center for ACE wraps for her lower extremities.      Orders Placed This Encounter  Procedures  . Comprehensive metabolic panel    Standing Status: Standing     Number of Occurrences: 9     Standing Expiration Date: 02/13/2015  . Hold Tube, Blood Bank    Standing Status: Standing     Number of Occurrences: 9     Standing Expiration Date: 02/13/2015   All questions were answered. The patient knows to call the clinic with any problems, questions or concerns. No barriers to learning was detected. I spent 30 minutes counseling the patient face to face. The total time spent in the appointment was 40 minutes and more than 50% was on counseling and review of test results     [facility], [place], MD 02/12/2014 10:55 AM

## 2014-02-12 NOTE — Assessment & Plan Note (Signed)
The cause is unclear.  Echocardiogram showed no evidence of congestive heart failure. I suspect this is due to poor venous circulation and possibly diastolic failure. In the meantime, she will continue on furosemide daily. I recommend referral to a vein center for ACE wraps for her lower extremities.

## 2014-02-12 NOTE — Patient Instructions (Signed)

## 2014-02-12 NOTE — Assessment & Plan Note (Signed)
Since discontinuation of methotrexate, her platelet count is actually worse. I suspect this is due to bone marrow infiltration from LGL. She does not need transfusions of platelets now. We'll monitor her blood counts carefully.

## 2014-02-12 NOTE — Telephone Encounter (Signed)
Pt confirmed labs/ov per 09/22 POF, gave pt AVS.....KJ °

## 2014-02-13 LAB — TYPE AND SCREEN
ABO/RH(D): O NEG
ANTIBODY SCREEN: NEGATIVE
Unit division: 0
Unit division: 0

## 2014-02-13 LAB — DIRECT ANTIGLOBULIN TEST (NOT AT ARMC)
DAT (Complement): NEGATIVE
DAT IgG: NEGATIVE

## 2014-02-19 ENCOUNTER — Encounter: Payer: Self-pay | Admitting: Hematology and Oncology

## 2014-02-19 ENCOUNTER — Telehealth: Payer: Self-pay | Admitting: Hematology and Oncology

## 2014-02-19 ENCOUNTER — Other Ambulatory Visit: Payer: Medicare Other

## 2014-02-19 ENCOUNTER — Other Ambulatory Visit: Payer: Self-pay | Admitting: Hematology and Oncology

## 2014-02-19 ENCOUNTER — Telehealth: Payer: Self-pay | Admitting: *Deleted

## 2014-02-19 DIAGNOSIS — R7401 Elevation of levels of liver transaminase levels: Secondary | ICD-10-CM

## 2014-02-19 DIAGNOSIS — T50905A Adverse effect of unspecified drugs, medicaments and biological substances, initial encounter: Secondary | ICD-10-CM

## 2014-02-19 DIAGNOSIS — R740 Nonspecific elevation of levels of transaminase and lactic acid dehydrogenase [LDH]: Principal | ICD-10-CM

## 2014-02-19 DIAGNOSIS — R609 Edema, unspecified: Secondary | ICD-10-CM

## 2014-02-19 DIAGNOSIS — C91Z0 Other lymphoid leukemia not having achieved remission: Secondary | ICD-10-CM

## 2014-02-19 DIAGNOSIS — R945 Abnormal results of liver function studies: Secondary | ICD-10-CM

## 2014-02-19 DIAGNOSIS — R7989 Other specified abnormal findings of blood chemistry: Secondary | ICD-10-CM

## 2014-02-19 DIAGNOSIS — D630 Anemia in neoplastic disease: Secondary | ICD-10-CM

## 2014-02-19 DIAGNOSIS — D6959 Other secondary thrombocytopenia: Secondary | ICD-10-CM

## 2014-02-19 HISTORY — DX: Abnormal results of liver function studies: R94.5

## 2014-02-19 HISTORY — DX: Other specified abnormal findings of blood chemistry: R79.89

## 2014-02-19 LAB — COMPREHENSIVE METABOLIC PANEL (CC13)
ALBUMIN: 3.2 g/dL — AB (ref 3.5–5.0)
ALK PHOS: 155 U/L — AB (ref 40–150)
ALT: 37 U/L (ref 0–55)
AST: 71 U/L — ABNORMAL HIGH (ref 5–34)
Anion Gap: 7 mEq/L (ref 3–11)
BUN: 18.7 mg/dL (ref 7.0–26.0)
CHLORIDE: 102 meq/L (ref 98–109)
CO2: 30 meq/L — AB (ref 22–29)
Calcium: 9.4 mg/dL (ref 8.4–10.4)
Creatinine: 1.1 mg/dL (ref 0.6–1.1)
GLUCOSE: 99 mg/dL (ref 70–140)
POTASSIUM: 4.1 meq/L (ref 3.5–5.1)
SODIUM: 139 meq/L (ref 136–145)
TOTAL PROTEIN: 5.3 g/dL — AB (ref 6.4–8.3)
Total Bilirubin: 3.34 mg/dL — ABNORMAL HIGH (ref 0.20–1.20)

## 2014-02-19 LAB — TECHNOLOGIST REVIEW

## 2014-02-19 LAB — CBC WITH DIFFERENTIAL/PLATELET
BASO%: 0.4 % (ref 0.0–2.0)
BASOS ABS: 0 10*3/uL (ref 0.0–0.1)
EOS%: 0.5 % (ref 0.0–7.0)
Eosinophils Absolute: 0 10*3/uL (ref 0.0–0.5)
HEMATOCRIT: 27 % — AB (ref 34.8–46.6)
HEMOGLOBIN: 8.5 g/dL — AB (ref 11.6–15.9)
LYMPH%: 71.2 % — ABNORMAL HIGH (ref 14.0–49.7)
MCH: 28.9 pg (ref 25.1–34.0)
MCHC: 31.5 g/dL (ref 31.5–36.0)
MCV: 91.8 fL (ref 79.5–101.0)
MONO#: 1 10*3/uL — AB (ref 0.1–0.9)
MONO%: 13.4 % (ref 0.0–14.0)
NEUT#: 1.1 10*3/uL — ABNORMAL LOW (ref 1.5–6.5)
NEUT%: 14.5 % — AB (ref 38.4–76.8)
Platelets: 27 10*3/uL — ABNORMAL LOW (ref 145–400)
RBC: 2.94 10*6/uL — ABNORMAL LOW (ref 3.70–5.45)
RDW: 20.4 % — ABNORMAL HIGH (ref 11.2–14.5)
WBC: 7.6 10*3/uL (ref 3.9–10.3)
lymph#: 5.4 10*3/uL — ABNORMAL HIGH (ref 0.9–3.3)
nRBC: 0 % (ref 0–0)

## 2014-02-19 LAB — HOLD TUBE, BLOOD BANK

## 2014-02-19 NOTE — Telephone Encounter (Signed)
Informed pt and husband of elevated LFT and Bilirubin and new order for [place] of Abdomen to be done w/i next few days.   Informed of office visit on Friday.  Arrive at 10:15 for lab and see Dr. Alvy Bimler at 10:45 am.  Both pt and husband verbalized understanding.

## 2014-02-19 NOTE — Telephone Encounter (Addendum)
Pt confirmed labs only per Dr. Alvy Bimler 09/29, no POF  gave pt AVS.......Marland Kitchen KJ

## 2014-02-19 NOTE — Telephone Encounter (Signed)
I reviewed her test results. Her platelet count is very low. I told her to hold methotrexate but to continue on prednisone. I would get her in for repeat blood work in 3 days time and recheck.

## 2014-02-21 ENCOUNTER — Telehealth: Payer: Self-pay | Admitting: *Deleted

## 2014-02-21 ENCOUNTER — Other Ambulatory Visit: Payer: Self-pay | Admitting: Hematology and Oncology

## 2014-02-21 ENCOUNTER — Telehealth: Payer: Self-pay | Admitting: Hematology and Oncology

## 2014-02-21 ENCOUNTER — Encounter: Payer: Self-pay | Admitting: Hematology and Oncology

## 2014-02-21 ENCOUNTER — Ambulatory Visit (HOSPITAL_COMMUNITY)
Admission: RE | Admit: 2014-02-21 | Discharge: 2014-02-21 | Disposition: A | Discharge status: Home / Self Care | Payer: Medicare Other | Source: Ambulatory Visit | Attending: Hematology and Oncology | Admitting: Hematology and Oncology

## 2014-02-21 DIAGNOSIS — T50905A Adverse effect of unspecified drugs, medicaments and biological substances, initial encounter: Secondary | ICD-10-CM

## 2014-02-21 DIAGNOSIS — Z9884 Bariatric surgery status: Secondary | ICD-10-CM

## 2014-02-21 DIAGNOSIS — R740 Nonspecific elevation of levels of transaminase and lactic acid dehydrogenase [LDH]: Secondary | ICD-10-CM | POA: Insufficient documentation

## 2014-02-21 DIAGNOSIS — R945 Abnormal results of liver function studies: Secondary | ICD-10-CM

## 2014-02-21 DIAGNOSIS — R1013 Epigastric pain: Secondary | ICD-10-CM | POA: Diagnosis not present

## 2014-02-21 DIAGNOSIS — K8010 Calculus of gallbladder with chronic cholecystitis without obstruction: Secondary | ICD-10-CM | POA: Diagnosis not present

## 2014-02-21 DIAGNOSIS — K8020 Calculus of gallbladder without cholecystitis without obstruction: Secondary | ICD-10-CM

## 2014-02-21 DIAGNOSIS — C91Z0 Other lymphoid leukemia not having achieved remission: Secondary | ICD-10-CM | POA: Insufficient documentation

## 2014-02-21 DIAGNOSIS — D6959 Other secondary thrombocytopenia: Secondary | ICD-10-CM | POA: Insufficient documentation

## 2014-02-21 DIAGNOSIS — R7401 Elevation of levels of liver transaminase levels: Secondary | ICD-10-CM

## 2014-02-21 DIAGNOSIS — K819 Cholecystitis, unspecified: Secondary | ICD-10-CM | POA: Insufficient documentation

## 2014-02-21 DIAGNOSIS — R7989 Other specified abnormal findings of blood chemistry: Secondary | ICD-10-CM

## 2014-02-21 HISTORY — DX: Cholecystitis, unspecified: K81.9

## 2014-02-21 NOTE — Telephone Encounter (Signed)
Message copied by Cathlean Cower on Thu Feb 21, 2014 10:39 AM ------      Message from: [facility] [place]: Thu Feb 21, 2014 10:08 AM      Regarding: urgent general surgery consult       I placed POF. Please check on the status this afternoon ------

## 2014-02-21 NOTE — Telephone Encounter (Signed)
THIS REPORT WAS CALLED AND FAXED. GIVEN TO [place].

## 2014-02-21 NOTE — Telephone Encounter (Signed)
Pt sched to see Dr. Barry Dienes on 10.9.15 @ 9am

## 2014-02-22 ENCOUNTER — Encounter: Payer: Self-pay | Admitting: Hematology and Oncology

## 2014-02-22 ENCOUNTER — Ambulatory Visit (HOSPITAL_BASED_OUTPATIENT_CLINIC_OR_DEPARTMENT_OTHER): Payer: Medicare Other

## 2014-02-22 ENCOUNTER — Inpatient Hospital Stay (HOSPITAL_COMMUNITY)
Admission: EM | Admit: 2014-02-22 | Discharge: 2014-02-28 | DRG: 445 | Disposition: A | Discharge status: Home / Self Care | Payer: Medicare Other | Attending: Family Medicine | Admitting: Family Medicine

## 2014-02-22 ENCOUNTER — Telehealth: Payer: Self-pay | Admitting: *Deleted

## 2014-02-22 ENCOUNTER — Emergency Department (HOSPITAL_COMMUNITY): Payer: Medicare Other

## 2014-02-22 ENCOUNTER — Other Ambulatory Visit: Payer: Medicare Other

## 2014-02-22 ENCOUNTER — Encounter (HOSPITAL_COMMUNITY): Payer: Self-pay | Admitting: Emergency Medicine

## 2014-02-22 ENCOUNTER — Inpatient Hospital Stay (HOSPITAL_COMMUNITY): Payer: Medicare Other

## 2014-02-22 ENCOUNTER — Ambulatory Visit (HOSPITAL_BASED_OUTPATIENT_CLINIC_OR_DEPARTMENT_OTHER): Payer: Medicare Other | Admitting: Hematology and Oncology

## 2014-02-22 VITALS — BP 103/49 | HR 60 | Temp 98.0°F | Resp 19 | Ht 62.0 in | Wt 211.3 lb

## 2014-02-22 DIAGNOSIS — IMO0002 Reserved for concepts with insufficient information to code with codable children: Secondary | ICD-10-CM

## 2014-02-22 DIAGNOSIS — R933 Abnormal findings on diagnostic imaging of other parts of digestive tract: Secondary | ICD-10-CM

## 2014-02-22 DIAGNOSIS — D72829 Elevated white blood cell count, unspecified: Secondary | ICD-10-CM

## 2014-02-22 DIAGNOSIS — D6959 Other secondary thrombocytopenia: Secondary | ICD-10-CM | POA: Diagnosis present

## 2014-02-22 DIAGNOSIS — K810 Acute cholecystitis: Secondary | ICD-10-CM | POA: Diagnosis present

## 2014-02-22 DIAGNOSIS — R600 Localized edema: Secondary | ICD-10-CM

## 2014-02-22 DIAGNOSIS — Z7952 Long term (current) use of systemic steroids: Secondary | ICD-10-CM

## 2014-02-22 DIAGNOSIS — Z90710 Acquired absence of both cervix and uterus: Secondary | ICD-10-CM | POA: Diagnosis not present

## 2014-02-22 DIAGNOSIS — R17 Unspecified jaundice: Secondary | ICD-10-CM | POA: Diagnosis present

## 2014-02-22 DIAGNOSIS — D649 Anemia, unspecified: Secondary | ICD-10-CM

## 2014-02-22 DIAGNOSIS — R7989 Other specified abnormal findings of blood chemistry: Secondary | ICD-10-CM

## 2014-02-22 DIAGNOSIS — C9190 Lymphoid leukemia, unspecified not having achieved remission: Secondary | ICD-10-CM | POA: Diagnosis present

## 2014-02-22 DIAGNOSIS — I10 Essential (primary) hypertension: Secondary | ICD-10-CM | POA: Diagnosis present

## 2014-02-22 DIAGNOSIS — K430 Incisional hernia with obstruction, without gangrene: Secondary | ICD-10-CM | POA: Diagnosis present

## 2014-02-22 DIAGNOSIS — K289 Gastrojejunal ulcer, unspecified as acute or chronic, without hemorrhage or perforation: Secondary | ICD-10-CM | POA: Diagnosis present

## 2014-02-22 DIAGNOSIS — R740 Nonspecific elevation of levels of transaminase and lactic acid dehydrogenase [LDH]: Secondary | ICD-10-CM | POA: Diagnosis present

## 2014-02-22 DIAGNOSIS — Z96641 Presence of right artificial hip joint: Secondary | ICD-10-CM | POA: Diagnosis present

## 2014-02-22 DIAGNOSIS — K8001 Calculus of gallbladder with acute cholecystitis with obstruction: Secondary | ICD-10-CM

## 2014-02-22 DIAGNOSIS — K8020 Calculus of gallbladder without cholecystitis without obstruction: Secondary | ICD-10-CM

## 2014-02-22 DIAGNOSIS — C91Z0 Other lymphoid leukemia not having achieved remission: Secondary | ICD-10-CM

## 2014-02-22 DIAGNOSIS — Z801 Family history of malignant neoplasm of trachea, bronchus and lung: Secondary | ICD-10-CM | POA: Diagnosis not present

## 2014-02-22 DIAGNOSIS — Z87891 Personal history of nicotine dependence: Secondary | ICD-10-CM | POA: Diagnosis not present

## 2014-02-22 DIAGNOSIS — Z8542 Personal history of malignant neoplasm of other parts of uterus: Secondary | ICD-10-CM

## 2014-02-22 DIAGNOSIS — K819 Cholecystitis, unspecified: Secondary | ICD-10-CM

## 2014-02-22 DIAGNOSIS — R197 Diarrhea, unspecified: Secondary | ICD-10-CM | POA: Diagnosis present

## 2014-02-22 DIAGNOSIS — E039 Hypothyroidism, unspecified: Secondary | ICD-10-CM | POA: Diagnosis present

## 2014-02-22 DIAGNOSIS — D630 Anemia in neoplastic disease: Secondary | ICD-10-CM

## 2014-02-22 DIAGNOSIS — Z79899 Other long term (current) drug therapy: Secondary | ICD-10-CM | POA: Diagnosis not present

## 2014-02-22 DIAGNOSIS — R945 Abnormal results of liver function studies: Secondary | ICD-10-CM | POA: Diagnosis present

## 2014-02-22 DIAGNOSIS — R161 Splenomegaly, not elsewhere classified: Secondary | ICD-10-CM | POA: Diagnosis present

## 2014-02-22 DIAGNOSIS — K811 Chronic cholecystitis: Secondary | ICD-10-CM

## 2014-02-22 DIAGNOSIS — K8010 Calculus of gallbladder with chronic cholecystitis without obstruction: Secondary | ICD-10-CM | POA: Diagnosis present

## 2014-02-22 DIAGNOSIS — Z96652 Presence of left artificial knee joint: Secondary | ICD-10-CM | POA: Diagnosis present

## 2014-02-22 DIAGNOSIS — R609 Edema, unspecified: Secondary | ICD-10-CM

## 2014-02-22 DIAGNOSIS — R1013 Epigastric pain: Secondary | ICD-10-CM | POA: Diagnosis present

## 2014-02-22 DIAGNOSIS — Z6838 Body mass index (BMI) 38.0-38.9, adult: Secondary | ICD-10-CM | POA: Diagnosis not present

## 2014-02-22 DIAGNOSIS — R1011 Right upper quadrant pain: Secondary | ICD-10-CM

## 2014-02-22 DIAGNOSIS — Z7982 Long term (current) use of aspirin: Secondary | ICD-10-CM

## 2014-02-22 DIAGNOSIS — E669 Obesity, unspecified: Secondary | ICD-10-CM | POA: Diagnosis present

## 2014-02-22 DIAGNOSIS — D696 Thrombocytopenia, unspecified: Secondary | ICD-10-CM

## 2014-02-22 DIAGNOSIS — K3189 Other diseases of stomach and duodenum: Secondary | ICD-10-CM

## 2014-02-22 DIAGNOSIS — Z9884 Bariatric surgery status: Secondary | ICD-10-CM

## 2014-02-22 DIAGNOSIS — K432 Incisional hernia without obstruction or gangrene: Secondary | ICD-10-CM

## 2014-02-22 DIAGNOSIS — M1000 Idiopathic gout, unspecified site: Secondary | ICD-10-CM | POA: Diagnosis present

## 2014-02-22 DIAGNOSIS — D61818 Other pancytopenia: Secondary | ICD-10-CM | POA: Diagnosis present

## 2014-02-22 DIAGNOSIS — R229 Localized swelling, mass and lump, unspecified: Secondary | ICD-10-CM

## 2014-02-22 DIAGNOSIS — R7401 Elevation of levels of liver transaminase levels: Secondary | ICD-10-CM | POA: Diagnosis present

## 2014-02-22 DIAGNOSIS — Z23 Encounter for immunization: Secondary | ICD-10-CM

## 2014-02-22 DIAGNOSIS — T50905A Adverse effect of unspecified drugs, medicaments and biological substances, initial encounter: Secondary | ICD-10-CM

## 2014-02-22 LAB — COMPREHENSIVE METABOLIC PANEL (CC13)
ALT: 39 U/L (ref 0–55)
ANION GAP: 8 meq/L (ref 3–11)
AST: 65 U/L — AB (ref 5–34)
Albumin: 3.2 g/dL — ABNORMAL LOW (ref 3.5–5.0)
Alkaline Phosphatase: 162 U/L — ABNORMAL HIGH (ref 40–150)
BUN: 12.7 mg/dL (ref 7.0–26.0)
CALCIUM: 9.4 mg/dL (ref 8.4–10.4)
CHLORIDE: 104 meq/L (ref 98–109)
CO2: 29 meq/L (ref 22–29)
Creatinine: 1 mg/dL (ref 0.6–1.1)
Glucose: 110 mg/dl (ref 70–140)
POTASSIUM: 3.5 meq/L (ref 3.5–5.1)
Sodium: 141 mEq/L (ref 136–145)
Total Bilirubin: 3.43 mg/dL — ABNORMAL HIGH (ref 0.20–1.20)
Total Protein: 5.4 g/dL — ABNORMAL LOW (ref 6.4–8.3)

## 2014-02-22 LAB — COMPREHENSIVE METABOLIC PANEL
ALBUMIN: 3.4 g/dL — AB (ref 3.5–5.2)
ALT: 37 U/L — AB (ref 0–35)
AST: 66 U/L — ABNORMAL HIGH (ref 0–37)
Alkaline Phosphatase: 156 U/L — ABNORMAL HIGH (ref 39–117)
Anion gap: 14 (ref 5–15)
BUN: 13 mg/dL (ref 6–23)
CALCIUM: 9.3 mg/dL (ref 8.4–10.5)
CO2: 27 mEq/L (ref 19–32)
Chloride: 98 mEq/L (ref 96–112)
Creatinine, Ser: 0.98 mg/dL (ref 0.50–1.10)
GFR calc Af Amer: 66 mL/min — ABNORMAL LOW (ref >90)
GFR calc non Af Amer: 57 mL/min — ABNORMAL LOW (ref >90)
Glucose, Bld: 112 mg/dL — ABNORMAL HIGH (ref 70–99)
POTASSIUM: 3.6 meq/L — AB (ref 3.7–5.3)
SODIUM: 139 meq/L (ref 137–147)
TOTAL PROTEIN: 5.7 g/dL — AB (ref 6.0–8.3)
Total Bilirubin: 3.4 mg/dL — ABNORMAL HIGH (ref 0.3–1.2)

## 2014-02-22 LAB — CBC WITH DIFFERENTIAL/PLATELET
BASO%: 0.5 % (ref 0.0–2.0)
BASOS ABS: 0.1 10*3/uL (ref 0.0–0.1)
BASOS ABS: 0 10*3/uL (ref 0.0–0.1)
Basophils Relative: 0 % (ref 0–1)
EOS%: 0.2 % (ref 0.0–7.0)
Eosinophils Absolute: 0 10*3/uL (ref 0.0–0.5)
Eosinophils Absolute: 0 10*3/uL (ref 0.0–0.7)
Eosinophils Relative: 0 % (ref 0–5)
HCT: 27.4 % — ABNORMAL LOW (ref 36.0–46.0)
HEMATOCRIT: 28.2 % — AB (ref 34.8–46.6)
HGB: 8.8 g/dL — ABNORMAL LOW (ref 11.6–15.9)
Hemoglobin: 8.9 g/dL — ABNORMAL LOW (ref 12.0–15.0)
LYMPH#: 9.4 10*3/uL — AB (ref 0.9–3.3)
LYMPH%: 82.4 % — ABNORMAL HIGH (ref 14.0–49.7)
LYMPHS PCT: 80 % — AB (ref 12–46)
Lymphs Abs: 10.8 10*3/uL — ABNORMAL HIGH (ref 0.7–4.0)
MCH: 29 pg (ref 25.1–34.0)
MCH: 29.5 pg (ref 26.0–34.0)
MCHC: 31.2 g/dL — ABNORMAL LOW (ref 31.5–36.0)
MCHC: 32.5 g/dL (ref 30.0–36.0)
MCV: 92.7 fL (ref 79.5–101.0)
MCV: 90.7 fL (ref 78.0–100.0)
MONO#: 0.3 10*3/uL (ref 0.1–0.9)
MONO%: 2.8 % (ref 0.0–14.0)
Monocytes Absolute: 0.9 10*3/uL (ref 0.1–1.0)
Monocytes Relative: 7 % (ref 3–12)
NEUT#: 1.6 10*3/uL (ref 1.5–6.5)
NEUT%: 14.1 % — ABNORMAL LOW (ref 38.4–76.8)
Neutro Abs: 1.8 10*3/uL (ref 1.7–7.7)
Neutrophils Relative %: 13 % — ABNORMAL LOW (ref 43–77)
PLATELETS: 36 10*3/uL — AB (ref 150–400)
Platelets: 51 10*3/uL — ABNORMAL LOW (ref 145–400)
RBC: 3.04 10*6/uL — ABNORMAL LOW (ref 3.70–5.45)
RBC: 3.02 MIL/uL — ABNORMAL LOW (ref 3.87–5.11)
RDW: 22 % — AB (ref 11.2–14.5)
RDW: 20.3 % — AB (ref 11.5–15.5)
WBC: 11.4 10*3/uL — AB (ref 3.9–10.3)
WBC: 13.5 10*3/uL — AB (ref 4.0–10.5)

## 2014-02-22 LAB — LIPASE, BLOOD: LIPASE: 21 U/L (ref 11–59)

## 2014-02-22 LAB — TECHNOLOGIST REVIEW

## 2014-02-22 LAB — HOLD TUBE, BLOOD BANK

## 2014-02-22 LAB — I-STAT CG4 LACTIC ACID, ED: Lactic Acid, Venous: 1.47 mmol/L (ref 0.5–2.2)

## 2014-02-22 MED ORDER — KCL IN DEXTROSE-NACL 10-5-0.45 MEQ/L-%-% IV SOLN
INTRAVENOUS | Status: AC
Start: 2014-02-22 — End: 2014-02-23
  Administered 2014-02-22: 18:00:00 via INTRAVENOUS
  Filled 2014-02-22 (×2): qty 1000

## 2014-02-22 MED ORDER — METOPROLOL TARTRATE 25 MG PO TABS
25.0000 mg | ORAL_TABLET | Freq: Two times a day (BID) | ORAL | Status: DC
  Administered 2014-02-23 – 2014-02-24 (×3): 25 mg via ORAL
  Filled 2014-02-22 (×3): qty 1

## 2014-02-22 MED ORDER — CITALOPRAM HYDROBROMIDE 20 MG PO TABS
20.0000 mg | ORAL_TABLET | Freq: Every morning | ORAL | Status: DC
  Administered 2014-02-23 – 2014-02-28 (×5): 20 mg via ORAL
  Filled 2014-02-22 (×6): qty 1

## 2014-02-22 MED ORDER — OXYBUTYNIN CHLORIDE 5 MG PO TABS
5.0000 mg | ORAL_TABLET | Freq: Two times a day (BID) | ORAL | Status: DC
  Administered 2014-02-23 – 2014-02-28 (×10): 5 mg via ORAL
  Filled 2014-02-22 (×13): qty 1

## 2014-02-22 MED ORDER — POLYETHYLENE GLYCOL 3350 17 G PO PACK
17.0000 g | PACK | Freq: Every day | ORAL | Status: DC | PRN
  Filled 2014-02-22: qty 1

## 2014-02-22 MED ORDER — FOLIC ACID 1 MG PO TABS
1.0000 mg | ORAL_TABLET | Freq: Every day | ORAL | Status: DC
  Administered 2014-02-23 – 2014-02-28 (×5): 1 mg via ORAL
  Filled 2014-02-22 (×6): qty 1

## 2014-02-22 MED ORDER — GUAIFENESIN-DM 100-10 MG/5ML PO SYRP
5.0000 mL | ORAL_SOLUTION | ORAL | Status: DC | PRN
  Administered 2014-02-24 (×3): 5 mL via ORAL
  Filled 2014-02-22 (×3): qty 10

## 2014-02-22 MED ORDER — MORPHINE SULFATE 4 MG/ML IJ SOLN
4.0000 mg | Freq: Once | INTRAMUSCULAR | Status: DC
  Filled 2014-02-22: qty 1

## 2014-02-22 MED ORDER — HYDROCORTISONE NA SUCCINATE PF 100 MG IJ SOLR
50.0000 mg | Freq: Two times a day (BID) | INTRAMUSCULAR | Status: DC
  Administered 2014-02-22 – 2014-02-24 (×4): 50 mg via INTRAVENOUS
  Filled 2014-02-22 (×5): qty 1

## 2014-02-22 MED ORDER — METOPROLOL TARTRATE 1 MG/ML IV SOLN: 5.0000 mg | INTRAVENOUS | Status: DC | PRN

## 2014-02-22 MED ORDER — INFLUENZA VAC SPLIT QUAD 0.5 ML IM SUSY
0.5000 mL | PREFILLED_SYRINGE | Freq: Once | INTRAMUSCULAR | Status: AC
  Administered 2014-02-22: 0.5 mL via INTRAMUSCULAR
  Filled 2014-02-22: qty 0.5

## 2014-02-22 MED ORDER — ALLOPURINOL 300 MG PO TABS
300.0000 mg | ORAL_TABLET | Freq: Every morning | ORAL | Status: DC
  Administered 2014-02-23 – 2014-02-28 (×5): 300 mg via ORAL
  Filled 2014-02-22 (×6): qty 1

## 2014-02-22 MED ORDER — LEVOTHYROXINE SODIUM 75 MCG PO TABS
75.0000 ug | ORAL_TABLET | Freq: Every day | ORAL | Status: DC
  Administered 2014-02-23 – 2014-02-28 (×5): 75 ug via ORAL
  Filled 2014-02-22 (×6): qty 1

## 2014-02-22 MED ORDER — SODIUM CHLORIDE 0.9 % IV SOLN
Freq: Once | INTRAVENOUS | Status: AC
  Administered 2014-02-22: 15:00:00 via INTRAVENOUS

## 2014-02-22 MED ORDER — ONDANSETRON HCL 4 MG/2ML IJ SOLN
4.0000 mg | Freq: Four times a day (QID) | INTRAMUSCULAR | Status: DC | PRN
  Administered 2014-02-23: 4 mg via INTRAVENOUS
  Filled 2014-02-22: qty 2

## 2014-02-22 MED ORDER — PIPERACILLIN-TAZOBACTAM 3.375 G IVPB 30 MIN
3.3750 g | INTRAVENOUS | Status: AC
  Administered 2014-02-22: 3.375 g via INTRAVENOUS
  Filled 2014-02-22: qty 50

## 2014-02-22 MED ORDER — PIPERACILLIN-TAZOBACTAM 3.375 G IVPB
3.3750 g | Freq: Three times a day (TID) | INTRAVENOUS | Status: DC
  Administered 2014-02-22 – 2014-02-27 (×14): 3.375 g via INTRAVENOUS
  Filled 2014-02-22 (×16): qty 50

## 2014-02-22 MED ORDER — HYDRALAZINE HCL 20 MG/ML IJ SOLN: 10.0000 mg | Freq: Four times a day (QID) | INTRAMUSCULAR | Status: DC | PRN

## 2014-02-22 MED ORDER — SODIUM CHLORIDE 0.9 % IJ SOLN
3.0000 mL | Freq: Two times a day (BID) | INTRAMUSCULAR | Status: DC
  Administered 2014-02-24 – 2014-02-27 (×6): 3 mL via INTRAVENOUS

## 2014-02-22 MED ORDER — MORPHINE SULFATE 2 MG/ML IJ SOLN: 2.0000 mg | INTRAMUSCULAR | Status: DC | PRN

## 2014-02-22 MED ORDER — ONDANSETRON HCL 4 MG PO TABS: 4.0000 mg | ORAL_TABLET | Freq: Four times a day (QID) | ORAL | Status: DC | PRN

## 2014-02-22 MED ORDER — HYDROCODONE-ACETAMINOPHEN 5-325 MG PO TABS
1.0000 | ORAL_TABLET | ORAL | Status: DC | PRN
  Administered 2014-02-24: 1 via ORAL
  Filled 2014-02-22: qty 1

## 2014-02-22 NOTE — ED Notes (Signed)
IV team called for IV start.

## 2014-02-22 NOTE — ED Notes (Signed)
Patient transported to X-ray 

## 2014-02-22 NOTE — H&P (Addendum)
Patient Demographics  Kyiesha Millward, is a 73 y.o. female  MRN: [id number]   DOB - 1941-05-10  Admit Date - 02/22/2014  Outpatient Primary MD for the patient is Lottie Mussel, MD  Oncologist Dr.Gorsuch   With History of -  Past Medical History  Diagnosis Date  . HTN (hypertension)   . Morbid obesity   . Rheumatic fever     child  . Dyslipidemia   . Swelling of joint of lower leg     recurrent lower extremity leg swelling, noted by pt  . Anemia   . Hypothyroid   . Varicose vein of leg   . MRSA (methicillin resistant staph aureus) culture positive   . TACHYCARDIA 10/17/2009    Qualifier: Diagnosis of  By: Rockey Situ MD, Tim    . Anxiety   . Cough   . Heart murmur   . Renal insufficiency 09/06/2011    DR. HA  [facility]   . Uterine cancer 1996    s/p hysterectomy; patient reported low stage; did not need adjuvant chemo  or radiation.   . DDD (degenerative disc disease), cervical   . 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 Lymphocytosis 03/12/2013  . Other pancytopenia 03/12/2013  . Weight loss 03/12/2013  . 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 Bilateral leg edema 04/09/2013  . Nausea alone 04/09/2013  . Creatinine elevation 04/16/2013  . Renal failure, acute 05/11/2013  . Sinus congestion 07/09/2013  . Headache 07/17/2013  . Abnormal liver function tests 02/19/2014  . Cholecystitis 02/21/2014      Past Surgical History  Procedure Laterality Date  . Anterior cervical decompression and fusion  09/2009    involving C4-7 levels as well as a posterior cervical fusion prcedure from C3 to T  . Tonsillectomy  1966  . Gastric stapling  12/1984 and 2001  . Abdominal  hysterectomy  1996  . Right hip replacement  1997  . Right ulnar nerve decompression  2005  . Right carpal tunnel release  2005  . Total left knee replacement  2006  . Halo application  51/89/8421    Procedure: HALO TRACTION APPLICATION;  Surgeon: Sinclair Ship, MD;  Location: [place];  Service: Orthopedics;  Laterality: N/A;  . Joint replacement      RIGHT KNEE   . No past surgeries      RIGHT THUMB   . Ulnar nerve repair      RIGHT ARM 1.5 YRS AGO   . Tooth extraction      OSTEO NECROSIS    IN JAW    . Posterior cervical fusion/foraminotomy N/A 08/03/2012    Procedure: POSTERIOR CERVICAL FUSION/FORAMINOTOMY LEVEL 2;  Surgeon: Sinclair Ship, MD;  Location: [place];  Service: Orthopedics;  Laterality: N/A;  Posterior cervical fusion,  cerival 1-2, cervical 2-3 with instrumentation, iliac crest autograft  . Right hip replacement    . Gastric roux-en-y      in for   Chief Complaint  Patient presents with  . Abdominal Pain     HPI  Steele Ledonne  is a 73 y.o. female,  with history of essential hypertension, rheumatic fever, dyslipidemia, gout on allopurinol, heart murmur, large granular lymphocytic leukemia for which she is under the care of Dr. 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 Who was being seen by Dr.Gorsuch in undergoing treatment for leukemia, her lab work was showing elevated total bilirubin and slightly elevated liver enzymes for a few weeks, she was also experiencing some epigastric/right upper quadrant pain worse by eating food better with bowel rest and defecation,  her oncologist ordered a right upper quadrant ultrasound which showed evidence of cholecystitis, she then consulted general surgeon Dr. Donne Hazel over the phone who recommended that patient come to the ER.  In the ER her diagnosis was again cholecystitis, general surgery was consulted and hospitalist team was requested to admit the patient. She currently denies any fever chills, no chest pain palpitations cough phlegm, chronic shortness of breath and fatigue, abdominal pain as above, no blood in stool or urine, no dysuria, no skin rashes or bruises, chronic joint pains and aches due to gout and arthritis, no focal weakness.    Review of Systems    In addition to the HPI above,   No Fever-chills, No Headache, No changes with Vision or hearing, No problems swallowing food or Liquids, No Chest pain, Cough or Shortness of Breath, +ve Abdominal pain & Nausea , no Vommitting, Bowel movements are regular, No Blood in stool or Urine, No dysuria, No new skin rashes or bruises, No new joints pains-aches,  No new weakness, tingling, numbness in any extremity, positive generalized weakness and fatigue ongoing for several months No recent weight gain or loss, No polyuria, polydypsia or polyphagia, No significant Mental Stressors.  A full 10 point Review of Systems was done, except as stated above, all other Review of Systems were negative.   Social History History  Substance Use Topics  . Smoking status: Former Smoker -- 0.25 packs/day for 10 years    Quit date: 05/24/1968  . Smokeless tobacco: Never Used     Comment: no smoking  . Alcohol Use: No      Family History Family History  Problem Relation Age of Onset  . Coronary artery disease Neg Hx   . Cancer Mother     lung cancer  . Liver disease Father   . Anemia Sister       Prior to Admission medications   Medication Sig Start Date End Date Taking? Authorizing Provider  acetaminophen-codeine (TYLENOL #3) 300-30 MG per tablet  Take 1-2 tablets by mouth 4 (four) times daily as needed (headache).  10/23/12  Yes Historical Provider, MD  allopurinol (ZYLOPRIM) 300 MG tablet Take 300 mg by mouth every morning.    Yes Historical Provider, MD  aspirin 81 MG tablet Take 81 mg by mouth daily.   Yes Historical Provider, MD  butalbital-acetaminophen-caffeine (FIORICET, ESGIC) 50-325-40 MG per tablet Take 1-2 tablets by mouth every  6 (six) hours as needed for headache (headache). 01/21/14  Yes Heath Lark, MD  Calcium Carbonate-Vitamin D (CALCIUM 600+D) 600-400 MG-UNIT per tablet Take 1 tablet by mouth daily.   Yes Historical Provider, MD  cholecalciferol (VITAMIN D) 1000 UNITS tablet Take 1,000 Units by mouth 2 (two) times daily.   Yes Historical Provider, MD  citalopram (CELEXA) 20 MG tablet Take 20 mg by mouth every morning.    Yes Historical Provider, MD  Cyanocobalamin (VITAMIN B-12 SL) Place 1 tablet under the tongue daily.   Yes Historical Provider, MD  fluticasone (FLONASE) 50 MCG/ACT nasal spray Place 3 sprays into the nose daily.   Yes Historical Provider, MD  folic acid (FOLVITE) 1 MG tablet Take 1 tablet (1 mg total) by mouth daily. 10/10/13  Yes Heath Lark, MD  furosemide (LASIX) 40 MG tablet Take 20 mg by mouth daily. 10/10/13  Yes Heath Lark, MD  levothyroxine (SYNTHROID, LEVOTHROID) 75 MCG tablet Take 75 mcg by mouth daily before breakfast.    Yes Historical Provider, MD  methotrexate (RHEUMATREX) 2.5 MG tablet Take 4 tablets (10 mg total) by mouth once a week. Caution:Chemotherapy. Protect from light. 01/21/14  Yes Heath Lark, MD  metoprolol tartrate (LOPRESSOR) 25 MG tablet Take 25 mg by mouth 2 (two) times daily.    Yes Historical Provider, MD  Multiple Vitamin (MULTIVITAMIN WITH MINERALS) TABS Take 1 tablet by mouth daily. Centrum   Yes Historical Provider, MD  ondansetron (ZOFRAN) 8 MG tablet Take 8 mg by mouth every 8 (eight) hours as needed for nausea or vomiting (nausea).   Yes Historical Provider, MD  oxybutynin  (DITROPAN) 5 MG tablet Take 5 mg by mouth Daily. 07/28/11  Yes Historical Provider, MD  Potassium 99 MG TABS Take 1 tablet by mouth daily.    Yes Historical Provider, MD  predniSONE (DELTASONE) 5 MG tablet Take 5 mg by mouth 2 (two) times daily with a meal.  09/10/13  Yes Heath Lark, MD    Allergies  Allergen Reactions  . Latex Itching and Rash  . Penicillins Rash  . Sulfonamide Derivatives Rash    Physical Exam  Vitals  Blood pressure 110/54, pulse 85, temperature 98.5 F (36.9 C), temperature source Oral, resp. rate 20, height 5\' 2"  (1.575 m), weight 92.987 kg (205 lb), SpO2 98.00%.   1. General elderly white female lying in bed in NAD,     2. Normal affect and insight, Not Suicidal or Homicidal, Awake Alert, Oriented X 3.  3. No F.N deficits, ALL C.Nerves Intact, Strength 5/5 all 4 extremities, Sensation intact all 4 extremities, Plantars down going.  4. Ears and Eyes appear Normal, Conjunctivae clear, PERRLA. Moist Oral Mucosa.  5. Supple Neck, No JVD, No cervical lymphadenopathy appriciated, No Carotid Bruits.  6. Symmetrical Chest wall movement, Good air movement bilaterally, CTAB.  7. RRR, No Gallops, Rubs ,  +ve systolic Murmur, No Parasternal Heave.  8. Positive Bowel Sounds, Abdomen Soft, +ve epigastric and RUQ tenderness, No organomegaly appriciated,No rebound -guarding or rigidity.  9.  No Cyanosis, Normal Skin Turgor, No Skin Rash or Bruise. 1+ edema  10. Good muscle tone,  joints appear normal , no effusions, Normal ROM.  11. No Palpable Lymph Nodes in Neck or Axillae     Data Review  CBC  Recent Labs Lab 02/19/14 0844 02/22/14 1015 02/22/14 1402  WBC 7.6 11.4* 13.5*  HGB 8.5* 8.8* 8.9*  HCT 27.0* 28.2* 27.4*  PLT 27* 51* 36*  MCV 91.8 92.7 90.7  MCH 28.9 29.0 29.5  MCHC 31.5 31.2* 32.5  RDW 20.4* 22.0* 20.3*  LYMPHSABS 5.4* 9.4* 10.8*  MONOABS 1.0* 0.3 0.9  EOSABS 0.0 0.0 0.0  BASOSABS 0.0 0.1 0.0    ------------------------------------------------------------------------------------------------------------------  Chemistries   Recent Labs Lab 02/19/14 0844 02/22/14 1015 02/22/14 1402  NA 139 141 139  K 4.1 3.5 3.6*  CL  --   --  98  CO2 30* 29 27  GLUCOSE 99 110 112*  BUN 18.7 12.7 13  CREATININE 1.1 1.0 0.98  CALCIUM 9.4 9.4 9.3  AST 71* 65* 66*  ALT 37 39 37*  ALKPHOS 155* 162* 156*  BILITOT 3.34* 3.43* 3.4*   ------------------------------------------------------------------------------------------------------------------ estimated creatinine clearance is 55.9 ml/min (by C-G formula based on Cr of 0.98). ------------------------------------------------------------------------------------------------------------------ No results found for this basename: TSH, T4TOTAL, FREET3, T3FREE, THYROIDAB,  in the last 72 hours   Coagulation profile No results found for this basename: INR, PROTIME,  in the last 168 hours ------------------------------------------------------------------------------------------------------------------- No results found for this basename: DDIMER,  in the last 72 hours -------------------------------------------------------------------------------------------------------------------  Cardiac Enzymes No results found for this basename: CK, CKMB, TROPONINI, MYOGLOBIN,  in the last 168 hours ------------------------------------------------------------------------------------------------------------------ No components found with this basename: POCBNP,    ---------------------------------------------------------------------------------------------------------------  Urinalysis    Component Value Date/Time   COLORURINE YELLOW 08/02/2012 [place] 08/02/2012 1437   LABSPEC 1.016 08/02/2012 1437   PHURINE 5.5 08/02/2012 [place] 08/02/2012 [place] 08/02/2012 1437   [place] 08/02/2012 1437    [place] 08/02/2012 1437   PROTEINUR NEGATIVE 08/02/2012 1437   UROBILINOGEN 0.2 08/02/2012 1437   NITRITE NEGATIVE 08/02/2012 1437   LEUKOCYTESUR NEGATIVE 08/02/2012 1437    ----------------------------------------------------------------------------------------------------------------  Imaging results:   Dg Chest 2 View  02/22/2014   CLINICAL DATA:  Preoperative exam prior to cholecystectomy; cough  EXAM: CHEST  2 VIEW  COMPARISON:  PA and lateral chest x-ray of August 02, 2012  FINDINGS: The lungs are adequately inflated. The interstitial markings are mildly increased bilaterally. The cardiopericardial silhouette is enlarged and the central pulmonary vascularity is engorged. There is tortuosity of the descending thoracic aorta. The bony thorax exhibits no acute abnormalities. The patient has undergone previous cervical and upper thoracic fusion.  IMPRESSION: There is low grade CHF with mild interstitial edema. There is no pleural effusion nor evidence of pneumonia.   Electronically Signed   By: David  Martinique   On: 02/22/2014 15:21   US Abdomen Limited  02/21/2014   CLINICAL DATA:  Transaminitis. History of laparoscopic band surgery.  EXAM: US ABDOMEN LIMITED - RIGHT UPPER QUADRANT  COMPARISON:  Abdominal pelvic CT 03/12/2013.  FINDINGS: Gallbladder:  Gallstones are noted within the gallbladder neck. There is moderate gallbladder wall thickening to 7 mm. There is a small amount of pericholecystic fluid, and fluid is also present within Morison's pouch. Sonographic Percell Miller sign is positive according to the sonographer.  Common bile duct:  Diameter: 5 mm.  No evidence of choledocholithiasis.  Liver:  No focal lesion identified. Within normal limits in parenchymal echogenicity.  IMPRESSION: 1. Cholelithiasis, gallbladder wall thickening and positive sonographic Murphy sign worrisome for acute cholecystitis. 2. No biliary dilatation. 3. These results will be called to the ordering clinician or  representative by the Radiologist Assistant, and communication documented in the PACS or zVision Dashboard.   Electronically Signed   By: Camie Patience M.D.   On: 02/21/2014 09:28   Baseline EKG and echo gram ordered  Assessment & Plan   1. Subacute cholecystitis - currently n.p.o., blood cultures, IV antibiotics, general surgery has seen the patient in deciding on open cholecystectomy versus cholecystectomy drain. She will be moderate to high risk for adverse complications due to her underlying leukemia and thrombocytopenia, deconditioning and age.   2. Large granular lymphocytic leukemia. Discussed her case with her oncologist Dr.Gorsuch she is recommended to continue prednisone, if blood pressure drops or patient is under stress with signs of sepsis we can switch to IV steroid, hold methotrexate, transfuse platelets and packed RBCs as desired. Will defer platelet transfusion to Gen. surgery based on their comfort level.   3. Heart murmur, severe deconditioning and shortness of breath with exertion. Will check baseline echo and EKG.   4. Essential hypertension. Continue oral Lopressor with as needed IV hydralazine and IV Lopressor.   5. Chronic edema. On Lasix. Holding for now. Monitor need of IV Lasix intermittently.   6. History of gout. On an ethanol continue.   7. Hypothyroidism. On Synthroid continue.     DVT Prophylaxis SCDs   AM Labs Ordered, also please review Full Orders  Family Communication: Admission, patients condition and plan of care including tests being ordered have been discussed with the patient and husband who indicate understanding and agree with the plan and Code Status.  Code Status Full  Likely DC to  Home  Condition GUARDED     Time spent in minutes : 35    Rami Waddle K M.D on 02/22/2014 at 4:13 PM  Between 7am to 7pm - Pager - [phone]  After 7pm go to [email or web address] - password [email or web address]  And look for the night coverage person  covering me after hours  [facility] Office  [phone]   **Disclaimer: This note may have been dictated with voice recognition software. Similar sounding words can inadvertently be transcribed and this note may contain transcription errors which may not have been corrected upon publication of note.**

## 2014-02-22 NOTE — ED Notes (Signed)
Per pt, has been diagnosed with gallstones.  Told to come to ED for abdominal pain and stones.  Paperwork with pt from MD office

## 2014-02-22 NOTE — Progress Notes (Signed)
[place] OFFICE PROGRESS NOTE  Patient Care Team: Madelyn Brunner, MD as PCP - General (Unknown Physician Specialty) Heath Lark, MD as Consulting Physician (Hematology and Oncology) Rollene Rotunda, MD as Consulting Physician (Hematology and Oncology)  SUMMARY OF ONCOLOGIC HISTORY: She was found to have anemia and was placed on observation. The cause of the anemia was thought to be related to chronic kidney disease. The patient also has history of Roux-en-Y gastric bypass and it was thought to be the contributing factor. 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 On 04/09/2013, she was started on prednisone 60 mg daily and methotrexate 7.5 mg once a week by mouth On 04/16/2013, she developed acute renal failure with hyperkalemia. Prednisone dose was reduced to 40 mg a day and methotrexate was placed on hold On 04/23/2013, acute renal failure was improving. The prednisone dose was reduced to 20 mg a day and would resume methotrexate at 7.5 mg once a week. On 04/27/2013, I reduced methotrexate to 5 mg once a week and prednisone dose was reduced to 10 mg alternate with 20 mg On 05/04/2013, I order one unit of blood transfusion. I The prednisone dose the same but reduced methotrexate to 2.5 mg once a week On 05/11/2013, I started to reduce prednisone dose further to 10 mg daily but to keep methotrexate at 2.5 mg once a week On 05/25/2013, I further reduce prednisone to 10 mg alternate with 7.5 mg and keep methotrexate at 2.5 mg once a week On 06/09/2013, and I further reduce prednisone to 5 mg daily and keep methotrexate at 2.5 mg once a week On 08/14/2013, I increase the methotrexate to 5 mg once a week and Prednisone at 5 mg daily. On 09/10/2013, I reduced prednisone to 5 mg alternate with 2.5 mg and keep methotrexate at 5 mg once a week. On 10/10/2013, I increased the methotrexate to 10 mg once a week and  The prednisone at 5 mg alternate with 2.5 mg. On 10/23/2013, she saw a hematologist at [facility] for second opinion. Further recommendations are pending. On 12/18/13: prednisone is tapered to 2.5 mg daily. Methotrexate at 10 mg per week. On 01/21/14: Prednisone was discontinued. She was taper off methotrexate. On 02/10/2014, methotrexate was reinitiated at 10 mg per week along with 10 mg prednisone. On 02/19/2014, methotrexate was placed on hold due to elevated liver function tests. On 02/21/2014, ultrasound abdomen showed cholecystitis.  INTERVAL HISTORY: Please see below for problem oriented charting. She denies right upper quadrant pain. Only discomfort. She has some nausea but no vomiting. Denies recent diarrhea. No recent fevers or chills. She has very poor energy and poor appetite. She has slight worsening lower extremity edema since we started her on Lasix.  REVIEW OF SYSTEMS:   Constitutional: Denies fevers, chills or abnormal weight loss Eyes: Denies blurriness of vision Ears, nose, mouth, throat, and face: Denies mucositis or sore throat Respiratory: Denies cough, dyspnea or wheezes Cardiovascular: Denies palpitation, chest discomfort  Skin: Denies abnormal skin rashes Lymphatics: Denies new lymphadenopathy or easy bruising Neurological:Denies numbness, tingling or new weaknesses Behavioral/Psych: Mood is stable, no new changes  All other systems were reviewed with the patient and are negative.  I have reviewed the past medical history, past surgical history, social history and family history with the patient and they are unchanged from previous note.  ALLERGIES:  is allergic to latex; penicillins; and sulfonamide derivatives.  MEDICATIONS:  Current Outpatient Prescriptions  Medication Sig Dispense Refill  . acetaminophen-codeine (TYLENOL #3) 300-30 MG per tablet Take 1 tablet by mouth 4 (four) times daily as needed for pain.       Marland Kitchen allopurinol (ZYLOPRIM) 300 MG tablet  Take 300 mg by mouth every morning.       . butalbital-acetaminophen-caffeine (FIORICET, ESGIC) 50-325-40 MG per tablet Take 1 tablet by mouth every 6 (six) hours as needed for headache.  90 tablet  0  . Calcium Carbonate-Vitamin D (CALCIUM 600+D) 600-400 MG-UNIT per tablet Take 1 tablet by mouth daily.      . cholecalciferol (VITAMIN D) 1000 UNITS tablet Take 1,000 Units by mouth 2 (two) times daily.      . citalopram (CELEXA) 20 MG tablet Take 20 mg by mouth every morning.       . Cyanocobalamin (VITAMIN B-12 SL) Place 1 tablet under the tongue daily.      . fluticasone (FLONASE) 50 MCG/ACT nasal spray Place 3 sprays into the nose daily.      . folic acid (FOLVITE) 1 MG tablet Take 1 tablet (1 mg total) by mouth daily.  90 tablet  3  . furosemide (LASIX) 40 MG tablet Take 20 mg by mouth daily.      Marland Kitchen levothyroxine (SYNTHROID, LEVOTHROID) 75 MCG tablet Take 75 mcg by mouth daily before breakfast.       . methotrexate (RHEUMATREX) 2.5 MG tablet Take 4 tablets (10 mg total) by mouth once a week. Caution:Chemotherapy. Protect from light.  48 tablet  0  . metoprolol tartrate (LOPRESSOR) 25 MG tablet Take 25 mg by mouth 2 (two) times daily.        . Multiple Vitamin (MULTIVITAMIN WITH MINERALS) TABS Take 1 tablet by mouth daily. Centrum      . ondansetron (ZOFRAN) 8 MG tablet Take 1 tablet (8 mg total) by mouth every 8 (eight) hours as needed for nausea.  30 tablet  3  . oxybutynin (DITROPAN) 5 MG tablet Take 5 mg by mouth Daily.      . Potassium 99 MG TABS Take by mouth.      . predniSONE (DELTASONE) 5 MG tablet Take 5 mg by mouth 2 (two) times daily with a meal. Mon Wed And Fridays 5 mg and 2.5 mg the rest of the week.       No current facility-administered medications for this visit.    PHYSICAL EXAMINATION: ECOG PERFORMANCE STATUS: 2 - Symptomatic, <50% confined to bed  Filed Vitals:   02/22/14 1009  BP: 103/49  Pulse: 60  Temp: 98 F (36.7 C)  Resp: 19   Filed Weights   02/22/14  1009  Weight: 211 lb 4.8 oz (95.845 kg)    GENERAL:alert, no distress and comfortable. She is obese and jaundiced SKIN: skin color is pale and slightly jaundiced, texture, turgor are normal, no rashes or significant lesions EYES: normal, Conjunctiva are pale and slightly jaundiced and non-injected OROPHARYNX:no exudate, no erythema and lips, buccal mucosa, and tongue normal  NECK: supple, thyroid normal size, non-tender, without nodularity LYMPH:  no palpable lymphadenopathy in the cervical, axillary or inguinal LUNGS: clear to auscultation and percussion with normal breathing effort HEART: regular rate & rhythm and no murmurs and no lower extremity edema ABDOMEN:abdomen soft, non-tender and normal bowel sounds. Mild right upper quadrant discomfort. Musculoskeletal:no cyanosis of digits and no clubbing  NEURO: alert & oriented x 3 with fluent speech, no focal motor/sensory deficits  LABORATORY DATA:  I have reviewed the data as listed  Component Value Date/Time   NA 141 02/22/2014 1015   NA 144 08/02/2012 1500   K 3.5 02/22/2014 1015   K 4.0 08/02/2012 1500   CL 105 10/25/2012 1135   CL 103 08/02/2012 1500   CO2 29 02/22/2014 1015   CO2 28 08/02/2012 1500   GLUCOSE 110 02/22/2014 1015   GLUCOSE 85 10/25/2012 1135   GLUCOSE 76 08/02/2012 1500   BUN 12.7 02/22/2014 1015   BUN 15 08/02/2012 1500   CREATININE 1.0 02/22/2014 1015   CREATININE 0.94 08/02/2012 1500   CREATININE 1.22* 09/08/2011 1120   CALCIUM 9.4 02/22/2014 1015   CALCIUM 10.3 08/02/2012 1500   PROT 5.4* 02/22/2014 1015   PROT 7.9 08/02/2012 1500   ALBUMIN 3.2* 02/22/2014 1015   ALBUMIN 4.1 08/02/2012 1500   AST 65* 02/22/2014 1015   AST 29 08/02/2012 1500   ALT 39 02/22/2014 1015   ALT 38* 08/02/2012 1500   ALKPHOS 162* 02/22/2014 1015   ALKPHOS 151* 08/02/2012 1500   BILITOT 3.43* 02/22/2014 1015   BILITOT 0.3 08/02/2012 1500   GFRNONAA 61* 08/02/2012 1500   GFRAA 70* 08/02/2012 1500    No results found for this basename: SPEP,   UPEP,   kappa and lambda light chains    Lab Results  Component Value Date   WBC 11.4* 02/22/2014   NEUTROABS 1.6 02/22/2014   HGB 8.8* 02/22/2014   HCT 28.2* 02/22/2014   MCV 92.7 02/22/2014   PLT 51* 02/22/2014      Chemistry      Component Value Date/Time   NA 141 02/22/2014 1015   NA 144 08/02/2012 1500   K 3.5 02/22/2014 1015   K 4.0 08/02/2012 1500   CL 105 10/25/2012 1135   CL 103 08/02/2012 1500   CO2 29 02/22/2014 1015   CO2 28 08/02/2012 1500   BUN 12.7 02/22/2014 1015   BUN 15 08/02/2012 1500   CREATININE 1.0 02/22/2014 1015   CREATININE 0.94 08/02/2012 1500   CREATININE 1.22* 09/08/2011 1120      Component Value Date/Time   CALCIUM 9.4 02/22/2014 1015   CALCIUM 10.3 08/02/2012 1500   ALKPHOS 162* 02/22/2014 1015   ALKPHOS 151* 08/02/2012 1500   AST 65* 02/22/2014 1015   AST 29 08/02/2012 1500   ALT 39 02/22/2014 1015   ALT 38* 08/02/2012 1500   BILITOT 3.43* 02/22/2014 1015   BILITOT 0.3 08/02/2012 1500       RADIOGRAPHIC STUDIES: I have personally reviewed the radiological images as listed and agreed with the findings in the report. US Abdomen Limited  02/21/2014   CLINICAL DATA:  Transaminitis. History of laparoscopic band surgery.  EXAM: US ABDOMEN LIMITED - RIGHT UPPER QUADRANT  COMPARISON:  Abdominal pelvic CT 03/12/2013.  FINDINGS: Gallbladder:  Gallstones are noted within the gallbladder neck. There is moderate gallbladder wall thickening to 7 mm. There is a small amount of pericholecystic fluid, and fluid is also present within Morison's pouch. Sonographic Percell Miller sign is positive according to the sonographer.  Common bile duct:  Diameter: 5 mm.  No evidence of choledocholithiasis.  Liver:  No focal lesion identified. Within normal limits in parenchymal echogenicity.  IMPRESSION: 1. Cholelithiasis, gallbladder wall thickening and positive sonographic Murphy sign worrisome for acute cholecystitis. 2. No biliary dilatation. 3. These results will be called to the ordering clinician  or representative by the Radiologist Assistant, and communication documented in the PACS or zVision Dashboard.   Electronically Signed   By: Modesta Messing.D.  On: 02/21/2014 09:28     ASSESSMENT & PLAN:  Large granular lymphocytic leukemia Per [facility] recommendation, we discontinued prednisone and methotrexate completely. Her blood work then became significantly worse and the patient had poor appetite, evidence of hemolysis, and weight loss and reduced energy level. She also had diffuse musculoskeletal pain. I recommend putting her back on 10 mg of prednisone daily & 10 mg of methotrexate once a week. Her treatment is placed on hold this week due to elevated liver function tests. I will continue to work on a weekly basis and see her every 2 weeks for toxicity review.   Cholecystitis She has mild leukocytosis and ultrasound confirmed gallstone disease. The total bilirubin level is getting worse. I will obtain surgical consult.  Anemia in neoplastic disease The cause of the anemia is due to bone marrow infiltration and component of anemia of chronic disease. I will observe for now. I would transfuse her with blood if hemoglobin dropped to less than 8 g. She needs irradiated blood products.  Thrombocytopenia due to drugs Since discontinuation of methotrexate, her platelet count is slightly better. I suspect this is due to bone marrow infiltration from LGL. She does not need transfusions of platelets now. We'll monitor her blood counts carefully.      No orders of the defined types were placed in this encounter.   All questions were answered. The patient knows to call the clinic with any problems, questions or concerns. No barriers to learning was detected. I spent 30 minutes counseling the patient face to face. The total time spent in the appointment was 40 minutes and more than 50% was on counseling and review of test results     [facility], [place], MD 02/22/2014 12:34 PM

## 2014-02-22 NOTE — Assessment & Plan Note (Signed)
Per [facility] recommendation, we discontinued prednisone and methotrexate completely. Her blood work then became significantly worse and the patient had poor appetite, evidence of hemolysis, and weight loss and reduced energy level. She also had diffuse musculoskeletal pain. I recommend putting her back on 10 mg of prednisone daily & 10 mg of methotrexate once a week. Her treatment is placed on hold this week due to elevated liver function tests. I will continue to work on a weekly basis and see her every 2 weeks for toxicity review.

## 2014-02-22 NOTE — Assessment & Plan Note (Addendum)
The cause of the anemia is due to bone marrow infiltration and component of anemia of chronic disease. I will observe for now. I would transfuse her with blood if hemoglobin dropped to less than 8 g. She needs irradiated blood products.

## 2014-02-22 NOTE — Assessment & Plan Note (Signed)
She has mild leukocytosis and ultrasound confirmed gallstone disease. The total bilirubin level is getting worse. I will obtain surgical consult.

## 2014-02-22 NOTE — Telephone Encounter (Signed)
Instructed husband that Dr. Donne Hazel instructed for pt to go to ED and will likely be admitted from there due to elevated bilirubin.  He verbalized understanding and will take pt to [facility] ED soon.

## 2014-02-22 NOTE — Consult Note (Signed)
I saw the patient, participated in the history, exam and medical decision making, and concur with the physician assistant's note above.  Chronic periumbilical pain with known omental incisional hernia (CT 2014) who reports more progressive periumbilical pain over the past several weeks - tends to occur when trying to have BM. She also c/o abd pain just below umbilicus to the left. Pain intermittent. ?right sided pain. Pt also has had abnml LFTs. Question of methotrexate effects. Sent u/s with above results.   Morbidly obese.nontoxic,  Soft, obese, old midline incision, with nonreducible incisional hernia, soft, mild TTP in the area. Tender to deep palpation in RUQ. No rt/guarding/peritonitis. LE edema  +cholelithiasis Elevated LFTs Leukemia Anemia of chronic disease Thrombocytopenia Morbid obesity Incarcerated Incisional hernia HTN Hypothyroid  Complex pt. Discussed with Dr Alen Blew. Possibility LFTs are due to MTX and not biliary origin. Given the patient's complex medical history and anemia and thrombocytopenia I believe she needs a confirmatory test to determine whether or not this is actually cholecystitis. I recommended a nuclear medicine scan of her gallbladder. Her abdominal pain is also varied. She seems to focus more on periumbilical and left lower quadrant discomfort. If her nuclear medicine scan is unremarkable she may need a repeat CT abdomen and pelvis to further delineate her incisional hernia to make sure it still hasOmentum stuck within it and nothing else. If she turns out to have cholecystitis by nuclear medicine scan, and I explained to the patient and her husband it becomes much more detailed discussion  Whether or not she is a candidate for surgery. We briefly discussed management options of cholecystitis such as cholecystectomy versus percutaneous drain; however I explained I think we needed to wait for the nuclear medicine scan results. She is in the process of getting an  echocardiogram as well. This may also contribute to the discussion as well.   Leighton Ruff. Redmond Pulling, MD, FACS General, Bariatric, & Minimally Invasive Surgery [facility], [place]

## 2014-02-22 NOTE — ED Notes (Signed)
Attempted IV start x 2 without success. Charge Nurse, Stacy notified.

## 2014-02-22 NOTE — ED Notes (Signed)
Surgery at bedside.

## 2014-02-22 NOTE — Assessment & Plan Note (Signed)
Since discontinuation of methotrexate, her platelet count is slightly better. I suspect this is due to bone marrow infiltration from LGL. She does not need transfusions of platelets now. We'll monitor her blood counts carefully.

## 2014-02-22 NOTE — Consult Note (Signed)
Reason for Consult: acute cholecystitis  Referring Physician: Dr. 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  The patient reports intermittent abdominal pain over the last 3 weeks.  Location is in the epigastric region and pelvic pain.  She reports it has worsened in the past 2-3 days.  The pain is intermittent.  No aggravating factors such as food.  Alleviated by defecation.  She reports diarrhea over the last few days. She reports nausea, which is somewhat chronic.  She denies vomiting.  She reports chills, denies fevers.  She is unable to characterize her pain.  She thought it was due to the long standing hernia she has had.  She reports significant loss as stated above which has been unintentional.  She reports chronic lower extremity edema, etiology is unclear, a echocardiogram was unremarkable.  She received a unit of PRBCs for a h&h of 8/25.4.  Today, she is 8.9/27.4.  Her platelet count is 36K.  Her white count is up to 13.5K.  A abdominal US shows cholelithiasis, GB wall thickening and a positive murphy's sign, CBD is 22m.  LFTs are trending up over the last 2 months, today AST is 66, ALT 37, Alk phos 156 and T bilirubin is 3.4. We have been asked to evaluate the patient for possible cholecystectomy.   Past Medical History  Diagnosis Date  . HTN (hypertension)   . Morbid obesity   . Rheumatic fever     child  . Dyslipidemia   . Swelling of joint of lower leg     recurrent lower extremity leg swelling, noted by pt  . Anemia   . Hypothyroid   . Varicose vein of leg   . MRSA (methicillin resistant staph  aureus) culture positive   . TACHYCARDIA 10/17/2009    Qualifier: Diagnosis of  By: GRockey SituMD, Tim    . Anxiety   . Cough   . Heart murmur   . Renal insufficiency 09/06/2011    DR. HA  [facility]   . Uterine cancer 1996    s/p hysterectomy; patient reported low stage; did not need adjuvant chemo  or radiation.   . DDD (degenerative disc disease), cervical   . 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 Lymphocytosis 03/12/2013  . Other pancytopenia 03/12/2013  . Weight loss 03/12/2013  . 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 Bilateral leg edema 04/09/2013  . Nausea alone 04/09/2013  . Creatinine elevation 04/16/2013  . Renal failure, acute 05/11/2013  . Sinus congestion 07/09/2013  . Headache 07/17/2013  . Abnormal liver function tests 02/19/2014  . Cholecystitis 02/21/2014    Past Surgical History  Procedure Laterality Date  . Anterior cervical decompression and fusion  09/2009    involving C4-7 levels as well as a posterior cervical fusion prcedure from C3 to T  . Tonsillectomy  1966  . Gastric stapling  12/1984 and 2001  . Abdominal hysterectomy  1996  . Right hip replacement  1997  . Right ulnar nerve decompression  2005  .  Right carpal tunnel release  2005  . Total left knee replacement  2006  . Halo application  07/37/1062    Procedure: HALO TRACTION APPLICATION;  Surgeon: Sinclair Ship, MD;  Location: [facility];  Service: Orthopedics;  Laterality: N/A;  . Joint replacement      RIGHT KNEE   . No past surgeries      RIGHT THUMB   . Ulnar nerve repair      RIGHT ARM 1.5 YRS AGO   . Tooth extraction      OSTEO NECROSIS    IN JAW    . Posterior cervical fusion/foraminotomy N/A 08/03/2012    Procedure: POSTERIOR CERVICAL FUSION/FORAMINOTOMY LEVEL 2;  Surgeon: Sinclair Ship, MD;  Location: [facility];  Service: Orthopedics;   Laterality: N/A;  Posterior cervical fusion, cerival 1-2, cervical 2-3 with instrumentation, iliac crest autograft  . Right hip replacement    . Gastric roux-en-y      Family History  Problem Relation Age of Onset  . Coronary artery disease Neg Hx   . Cancer Mother     lung cancer  . Liver disease Father   . Anemia Sister     Social History:  reports that she quit smoking about 22 years ago. She has never used smokeless tobacco. She reports that she does not drink alcohol or use illicit drugs.  Allergies:  Allergies  Allergen Reactions  . Latex Itching and Rash  . Penicillins Rash  . Sulfonamide Derivatives Rash    Medications:  No current facility-administered medications on file prior to encounter.   Current Outpatient Prescriptions on File Prior to Encounter  Medication Sig Dispense Refill  . acetaminophen-codeine (TYLENOL #3) 300-30 MG per tablet Take 1-2 tablets by mouth 4 (four) times daily as needed (headache).       Marland Kitchen allopurinol (ZYLOPRIM) 300 MG tablet Take 300 mg by mouth every morning.       . Calcium Carbonate-Vitamin D (CALCIUM 600+D) 600-400 MG-UNIT per tablet Take 1 tablet by mouth daily.      . cholecalciferol (VITAMIN D) 1000 UNITS tablet Take 1,000 Units by mouth 2 (two) times daily.      . citalopram (CELEXA) 20 MG tablet Take 20 mg by mouth every morning.       . Cyanocobalamin (VITAMIN B-12 SL) Place 1 tablet under the tongue daily.      . fluticasone (FLONASE) 50 MCG/ACT nasal spray Place 3 sprays into the nose daily.      . folic acid (FOLVITE) 1 MG tablet Take 1 tablet (1 mg total) by mouth daily.  90 tablet  3  . furosemide (LASIX) 40 MG tablet Take 20 mg by mouth daily.      Marland Kitchen levothyroxine (SYNTHROID, LEVOTHROID) 75 MCG tablet Take 75 mcg by mouth daily before breakfast.       . methotrexate (RHEUMATREX) 2.5 MG tablet Take 4 tablets (10 mg total) by mouth once a week. Caution:Chemotherapy. Protect from light.  48 tablet  0  . metoprolol tartrate  (LOPRESSOR) 25 MG tablet Take 25 mg by mouth 2 (two) times daily.       . Multiple Vitamin (MULTIVITAMIN WITH MINERALS) TABS Take 1 tablet by mouth daily. Centrum      . oxybutynin (DITROPAN) 5 MG tablet Take 5 mg by mouth Daily.      . Potassium 99 MG TABS Take 1 tablet by mouth daily.       . predniSONE (DELTASONE) 5 MG tablet Take 5 mg  by mouth 2 (two) times daily with a meal.          Results for orders placed during the hospital encounter of 02/22/14 (from the past 48 hour(s))  CBC WITH DIFFERENTIAL     Status: Abnormal (Preliminary result)   Collection Time    02/22/14  2:02 PM      Result Value Ref Range   WBC 13.5 (*) 4.0 - 10.5 K/uL   RBC 3.02 (*) 3.87 - 5.11 MIL/uL   Hemoglobin 8.9 (*) 12.0 - 15.0 g/dL   HCT 27.4 (*) 36.0 - 46.0 %   MCV 90.7  78.0 - 100.0 fL   MCH 29.5  26.0 - 34.0 pg   MCHC 32.5  30.0 - 36.0 g/dL   RDW 20.3 (*) 11.5 - 15.5 %   Platelets 36 (*) 150 - 400 K/uL   Neutrophils Relative % PENDING  43 - 77 %   Neutro Abs PENDING  1.7 - 7.7 K/uL   Band Neutrophils PENDING  0 - 10 %   Lymphocytes Relative PENDING  12 - 46 %   Lymphs Abs PENDING  0.7 - 4.0 K/uL   Monocytes Relative PENDING  3 - 12 %   Monocytes Absolute PENDING  0.1 - 1.0 K/uL   Eosinophils Relative PENDING  0 - 5 %   Eosinophils Absolute PENDING  0.0 - 0.7 K/uL   Basophils Relative PENDING  0 - 1 %   Basophils Absolute PENDING  0.0 - 0.1 K/uL   WBC Morphology PENDING     RBC Morphology PENDING     Smear Review PENDING     nRBC PENDING  0 /100 WBC   Metamyelocytes Relative PENDING     Myelocytes PENDING     Promyelocytes Absolute PENDING     Blasts PENDING    LIPASE, BLOOD     Status: None   Collection Time    02/22/14  2:02 PM      Result Value Ref Range   Lipase 21  11 - 59 U/L  COMPREHENSIVE METABOLIC PANEL     Status: Abnormal   Collection Time    02/22/14  2:02 PM      Result Value Ref Range   Sodium 139  137 - 147 mEq/L   Potassium 3.6 (*) 3.7 - 5.3 mEq/L   Chloride 98  96 -  112 mEq/L   CO2 27  19 - 32 mEq/L   Glucose, Bld 112 (*) 70 - 99 mg/dL   BUN 13  6 - 23 mg/dL   Creatinine, Ser 0.98  0.50 - 1.10 mg/dL   Calcium 9.3  8.4 - 10.5 mg/dL   Total Protein 5.7 (*) 6.0 - 8.3 g/dL   Albumin 3.4 (*) 3.5 - 5.2 g/dL   AST 66 (*) 0 - 37 U/L   ALT 37 (*) 0 - 35 U/L   Alkaline Phosphatase 156 (*) 39 - 117 U/L   Total Bilirubin 3.4 (*) 0.3 - 1.2 mg/dL   GFR calc non Af Amer 57 (*) >90 mL/min   GFR calc Af Amer 66 (*) >90 mL/min   Comment: (NOTE)     The eGFR has been calculated using the CKD EPI equation.     This calculation has not been validated in all clinical situations.     eGFR's persistently <90 mL/min signify possible Chronic Kidney     Disease.   Anion gap 14  5 - 15  I-STAT CG4 LACTIC ACID, ED  Status: None   Collection Time    02/22/14  2:12 PM      Result Value Ref Range   Lactic Acid, Venous 1.47  0.5 - 2.2 mmol/L    US Abdomen Limited  02/21/2014   CLINICAL DATA:  Transaminitis. History of laparoscopic band surgery.  EXAM: US ABDOMEN LIMITED - RIGHT UPPER QUADRANT  COMPARISON:  Abdominal pelvic CT 03/12/2013.  FINDINGS: Gallbladder:  Gallstones are noted within the gallbladder neck. There is moderate gallbladder wall thickening to 7 mm. There is a small amount of pericholecystic fluid, and fluid is also present within Morison's pouch. Sonographic Percell Miller sign is positive according to the sonographer.  Common bile duct:  Diameter: 5 mm.  No evidence of choledocholithiasis.  Liver:  No focal lesion identified. Within normal limits in parenchymal echogenicity.  IMPRESSION: 1. Cholelithiasis, gallbladder wall thickening and positive sonographic Murphy sign worrisome for acute cholecystitis. 2. No biliary dilatation. 3. These results will be called to the ordering clinician or representative by the Radiologist Assistant, and communication documented in the PACS or zVision Dashboard.   Electronically Signed   By: Camie Patience M.D.   On: 02/21/2014 09:28     Review of Systems  All other systems reviewed and are negative.  Blood pressure 110/54, pulse 85, temperature 98.5 F (36.9 C), temperature source Oral, resp. rate 20, height 5' 2"  (1.575 m), weight 205 lb (92.987 kg), SpO2 98.00%. Physical Exam  Constitutional: She is oriented to person, place, and time. No distress.  Ill appearing  HENT:  Head: Normocephalic and atraumatic.  Neck: Normal range of motion. Neck supple.  Posterior neck scar  Cardiovascular: Normal rate, regular rhythm, normal heart sounds and intact distal pulses.  Exam reveals no gallop and no friction rub.   No murmur heard. Respiratory: Effort normal and breath sounds normal. No respiratory distress. She has no wheezes. She has no rales. She exhibits no tenderness.  GI: Soft. Bowel sounds are normal.  Incisional hernia, minimally tender to palpation, unable to reduce--CT 2014 shows omentum, no bowel.  TTP RUQ and epigastric region.  TTP pelvic region.  2 midline scars.   Musculoskeletal:  +2-3 pitting BLE edema  Neurological: She is alert and oriented to person, place, and time.  Skin: Skin is warm and dry. No rash noted. She is not diaphoretic. There is pallor.  Minimal BLE erythema  Psychiatric: She has a normal mood and affect. Her behavior is normal. Judgment and thought content normal.  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  Place NPO for now, start zosyn, IV hydration and pain control.  Given her low platelet count and duration of symptoms, I'm  not certain a cholecystectomy is appropriate at this time, will consider a cholecystostomy tube.  Will discuss with Dr. Redmond Pulling.  Further recommendations to follow.   Fremont Skalicky ANP-BC 02/22/2014, 3:12 PM

## 2014-02-22 NOTE — Progress Notes (Signed)
ANTIBIOTIC CONSULT NOTE - INITIAL  Pharmacy Consult for Zosyn Indication: intra-abdominal infection  Allergies  Allergen Reactions  . Latex Itching and Rash  . Penicillins Rash  . Sulfonamide Derivatives Rash    Patient Measurements: Height: 5\' 2"  (157.5 cm) Weight: 205 lb (92.987 kg) IBW/kg (Calculated) : 50.1  Vital Signs: Temp: 98.5 F (36.9 C) (10/02 1335) Temp Source: Oral (10/02 1335) BP: 110/54 mmHg (10/02 1335) Pulse Rate: 85 (10/02 1335) Intake/Output from previous day:   Intake/Output from this shift:    Labs:  Recent Labs  02/22/14 1015 02/22/14 1015 02/22/14 1402  WBC 11.4*  --  13.5*  HGB 8.8*  --  8.9*  PLT 51*  --  36*  CREATININE  --  1.0 0.98   Estimated Creatinine Clearance: 55.9 ml/min (by C-G formula based on Cr of 0.98). No results found for this basename: Letta Median, VANCORANDOM, [place], GENTPEAK, [place], [place], TOBRAPEAK, TOBRARND, AMIKACINPEAK, AMIKACINTROU, AMIKACIN,  in the last 72 hours   Microbiology: Recent Results (from the past 720 hour(s))  TECHNOLOGIST REVIEW     Status: None   Collection Time    02/12/14  8:04 AM      Result Value Ref Range Status   Technologist Review Variant lymphs present   Final  TECHNOLOGIST REVIEW     Status: None   Collection Time    02/19/14  8:44 AM      Result Value Ref Range Status   Technologist Review Variant lymphs present   Final  TECHNOLOGIST REVIEW     Status: None   Collection Time    02/22/14 10:15 AM      Result Value Ref Range Status   Technologist Review Variant lymphs present, rare myelocyte and nRBC    Final    Medical History: Past Medical History  Diagnosis Date  . HTN (hypertension)   . Morbid obesity   . Rheumatic fever     child  . Dyslipidemia   . Swelling of joint of lower leg     recurrent lower extremity leg swelling, noted by pt  . Anemia   . Hypothyroid   . Varicose vein of leg   . MRSA (methicillin resistant staph aureus) culture  positive   . TACHYCARDIA 10/17/2009    Qualifier: Diagnosis of  By: Rockey Situ MD, Tim    . Anxiety   . Cough   . Heart murmur   . Renal insufficiency 09/06/2011    DR. HA  [facility]   . Uterine cancer 1996    s/p hysterectomy; patient reported low stage; did not need adjuvant chemo  or radiation.   . DDD (degenerative disc disease), cervical   . 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 Lymphocytosis 03/12/2013  . Other pancytopenia 03/12/2013  . Weight loss 03/12/2013  . 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 Bilateral leg edema 04/09/2013  . Nausea alone 04/09/2013  . Creatinine elevation 04/16/2013  . Renal failure, acute 05/11/2013  . Sinus congestion 07/09/2013  . Headache 07/17/2013  . Abnormal liver function tests 02/19/2014  . Cholecystitis 02/21/2014     Assessment: 84 yoF with history of large granular lymphocytic leukemia on weekly methotrexate and BID prednisone, admitted with cholecystitis.  She has mild leukocytosis and ultrasound confirmed gallstone disease.  Pharmacy consulted to dose Zosyn.  Noted allergy of rash to  penicillins.  Will continue with order for Zosyn since patient appears to have tolerated other beta lactams during previous encounters.  Spoke with RN to monitor for rash.  Tmax: afebrile WBC: elevated Renal: SCr 0.98, CrCl~76 ml/min  Goal of Therapy:  Doses adjusted per renal function Eradication of infection  Plan:  Zosyn 3.375g IV q8h (4 hour infusion time).  Hershal Coria 02/22/2014,3:50 PM

## 2014-02-22 NOTE — Telephone Encounter (Signed)
Called [facility] to see if pt can be seen sooner than next week per Dr. Alvy Bimler request.  Pt's bilirubin continues to increase.  Spoke w/ nurse Anderson Malta and she says that, after review,  Dr. Donne Hazel instructs that pt needs to go to ED and will likely be admitted for increased bilirubin.   Informed Dr. Alvy Bimler of Dr. Cristal Generous instructions and she instructs to notify pt/husband of his instructions.   Called pt/husband and left message on cell and home numbers to return nurse's call.

## 2014-02-22 NOTE — ED Notes (Signed)
MD at bedside. Hospitalist at bedside. 

## 2014-02-22 NOTE — ED Provider Notes (Addendum)
CSN: [id number]     Arrival date & time 02/22/14  1315 History   First MD Initiated Contact with Patient 02/22/14 1341     Chief Complaint  Patient presents with  . 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 Patient has had intermittent worsening pain at times with palpation at times with eating. Patient's had intermittent pain epigastric and right upper quadrant and also left lower corner. No specific radiation. Patient has noticed changing color of her stools recently.  The history is provided by the patient.    Past Medical History  Diagnosis Date  . HTN (hypertension)   . Morbid obesity   . Rheumatic fever     child  . Dyslipidemia   . Swelling of joint of lower leg     recurrent lower extremity leg swelling, noted by pt  . Anemia   . Hypothyroid   . Varicose vein of leg   . MRSA (methicillin resistant staph aureus) culture positive   . TACHYCARDIA 10/17/2009    Qualifier: Diagnosis of  By: Rockey Situ MD, Tim    . Anxiety   . Cough   . Heart murmur   . Renal insufficiency 09/06/2011    DR. HA  [facility]   . Uterine cancer 1996    s/p hysterectomy; patient reported low stage; did not need adjuvant chemo  or radiation.   . DDD (degenerative disc disease), cervical   . 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 Lymphocytosis 03/12/2013  . Other pancytopenia 03/12/2013  . Weight loss 03/12/2013  . 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 Bilateral leg edema 04/09/2013  . Nausea alone 04/09/2013  . Creatinine  elevation 04/16/2013  . Renal failure, acute 05/11/2013  . Sinus congestion 07/09/2013  . Headache 07/17/2013  . Abnormal liver function tests 02/19/2014  . Cholecystitis 02/21/2014   Past Surgical History  Procedure Laterality Date  . Anterior cervical decompression and fusion  09/2009    involving C4-7 levels as well as a posterior cervical fusion prcedure from C3 to T  . Tonsillectomy  1966  . Gastric stapling  12/1984 and 2001  . Abdominal hysterectomy  1996  . Right hip replacement  1997  . Right ulnar nerve decompression  2005  . Right carpal tunnel release  2005  . Total left knee replacement  2006  . Halo application  58/01/9832    Procedure: HALO TRACTION APPLICATION;  Surgeon: Sinclair Ship, MD;  Location: [facility];  Service: Orthopedics;  Laterality: N/A;  . Joint replacement      RIGHT KNEE   . No past surgeries      RIGHT THUMB   . Ulnar nerve repair      RIGHT ARM 1.5 YRS AGO   . Tooth extraction      OSTEO NECROSIS    IN JAW    . Posterior cervical fusion/foraminotomy N/A 08/03/2012    Procedure: POSTERIOR CERVICAL FUSION/FORAMINOTOMY LEVEL 2;  Surgeon: Sinclair Ship, MD;  Location: [facility];  Service: Orthopedics;  Laterality: N/A;  Posterior cervical fusion, cerival 1-2, cervical 2-3 with instrumentation, iliac crest autograft  . Right hip replacement  Family History  Problem Relation Age of Onset  . Coronary artery disease Neg Hx   . Cancer Mother     lung cancer  . Liver disease Father   . Anemia Sister    History  Substance Use Topics  . Smoking status: Former Smoker -- 0.25 packs/day for 10 years    Quit date: 05/25/1991  . Smokeless tobacco: Never Used     Comment: no smoking  . Alcohol Use: No   OB History   Grav Para Term Preterm Abortions TAB SAB Ect Mult Living                 Review of Systems  Constitutional: Positive for appetite change. Negative for fever and chills.  HENT: Negative for congestion.   Eyes: Negative for visual  disturbance.  Respiratory: Positive for cough. Negative for shortness of breath.   Cardiovascular: Negative for chest pain.  Gastrointestinal: Positive for nausea and abdominal pain. Negative for vomiting.  Genitourinary: Negative for dysuria and flank pain.  Musculoskeletal: Negative for back pain, neck pain and neck stiffness.  Skin: Negative for rash.  Neurological: Negative for light-headedness and headaches.      Allergies  Latex; Penicillins; and Sulfonamide derivatives  Home Medications   Prior to Admission medications   Medication Sig Start Date End Date Taking? Authorizing Provider  acetaminophen-codeine (TYLENOL #3) 300-30 MG per tablet Take 1-2 tablets by mouth 4 (four) times daily as needed (headache).  10/23/12  Yes Historical Provider, MD  allopurinol (ZYLOPRIM) 300 MG tablet Take 300 mg by mouth every morning.    Yes Historical Provider, MD  aspirin 81 MG tablet Take 81 mg by mouth daily.   Yes Historical Provider, MD  butalbital-acetaminophen-caffeine (FIORICET, ESGIC) 50-325-40 MG per tablet Take 1-2 tablets by mouth every 6 (six) hours as needed for headache (headache). 01/21/14  Yes Heath Lark, MD  Calcium Carbonate-Vitamin D (CALCIUM 600+D) 600-400 MG-UNIT per tablet Take 1 tablet by mouth daily.   Yes Historical Provider, MD  cholecalciferol (VITAMIN D) 1000 UNITS tablet Take 1,000 Units by mouth 2 (two) times daily.   Yes Historical Provider, MD  citalopram (CELEXA) 20 MG tablet Take 20 mg by mouth every morning.    Yes Historical Provider, MD  Cyanocobalamin (VITAMIN B-12 SL) Place 1 tablet under the tongue daily.   Yes Historical Provider, MD  fluticasone (FLONASE) 50 MCG/ACT nasal spray Place 3 sprays into the nose daily.   Yes Historical Provider, MD  folic acid (FOLVITE) 1 MG tablet Take 1 tablet (1 mg total) by mouth daily. 10/10/13  Yes Heath Lark, MD  furosemide (LASIX) 40 MG tablet Take 20 mg by mouth daily. 10/10/13  Yes Heath Lark, MD  levothyroxine  (SYNTHROID, LEVOTHROID) 75 MCG tablet Take 75 mcg by mouth daily before breakfast.    Yes Historical Provider, MD  methotrexate (RHEUMATREX) 2.5 MG tablet Take 4 tablets (10 mg total) by mouth once a week. Caution:Chemotherapy. Protect from light. 01/21/14  Yes Heath Lark, MD  metoprolol tartrate (LOPRESSOR) 25 MG tablet Take 25 mg by mouth 2 (two) times daily.    Yes Historical Provider, MD  Multiple Vitamin (MULTIVITAMIN WITH MINERALS) TABS Take 1 tablet by mouth daily. Centrum   Yes Historical Provider, MD  ondansetron (ZOFRAN) 8 MG tablet Take 8 mg by mouth every 8 (eight) hours as needed for nausea or vomiting (nausea).   Yes Historical Provider, MD  oxybutynin (DITROPAN) 5 MG tablet Take 5 mg by mouth Daily. 07/28/11  Yes  Historical Provider, MD  Potassium 99 MG TABS Take 1 tablet by mouth daily.    Yes Historical Provider, MD  predniSONE (DELTASONE) 5 MG tablet Take 5 mg by mouth 2 (two) times daily with a meal.  09/10/13  Yes Ni Gorsuch, MD   BP 110/54  Pulse 85  Temp(Src) 98.5 F (36.9 C) (Oral)  Resp 20  Ht 5\' 2"  (1.575 m)  Wt 205 lb (92.987 kg)  BMI 37.49 kg/m2  SpO2 98% Physical Exam  Nursing note and vitals reviewed. Constitutional: She is oriented to person, place, and time. She appears well-developed and well-nourished.  HENT:  Head: Normocephalic and atraumatic.  Eyes: Conjunctivae are normal. Right eye exhibits no discharge. Left eye exhibits no discharge.  Neck: Normal range of motion. Neck supple. No tracheal deviation present.  Cardiovascular: Normal rate and regular rhythm.   Pulmonary/Chest: Effort normal and breath sounds normal.  Abdominal: Soft. She exhibits no distension. There is tenderness (epigastric and right upper quadrant moderate, mild left lower quadrant, large ventral hernia no focal tenderness and appears to be reducible however difficult with body habitus.). There is no guarding.  Musculoskeletal: She exhibits edema (mild bilateral lower trimly use).   Neurological: She is alert and oriented to person, place, and time.  Skin: Skin is warm. No rash noted.  Psychiatric: She has a normal mood and affect.    ED Course  Procedures (including critical care time) Labs Review Labs Reviewed  CBC WITH DIFFERENTIAL - Abnormal; Notable for the following:    WBC 13.5 (*)    RBC 3.02 (*)    Hemoglobin 8.9 (*)    HCT 27.4 (*)    RDW 20.3 (*)    Platelets 36 (*)    All other components within normal limits  COMPREHENSIVE METABOLIC PANEL - Abnormal; Notable for the following:    Potassium 3.6 (*)    Glucose, Bld 112 (*)    Total Protein 5.7 (*)    Albumin 3.4 (*)    AST 66 (*)    ALT 37 (*)    Alkaline Phosphatase 156 (*)    Total Bilirubin 3.4 (*)    GFR calc non Af Amer 57 (*)    GFR calc Af Amer 66 (*)    All other components within normal limits  LIPASE, BLOOD  URINALYSIS, ROUTINE W REFLEX MICROSCOPIC  I-STAT CG4 LACTIC ACID, ED    Imaging Review US Abdomen Limited  02/21/2014   CLINICAL DATA:  Transaminitis. History of laparoscopic band surgery.  EXAM: US ABDOMEN LIMITED - RIGHT UPPER QUADRANT  COMPARISON:  Abdominal pelvic CT 03/12/2013.  FINDINGS: Gallbladder:  Gallstones are noted within the gallbladder neck. There is moderate gallbladder wall thickening to 7 mm. There is a small amount of pericholecystic fluid, and fluid is also present within Morison's pouch. Sonographic Percell Miller sign is positive according to the sonographer.  Common bile duct:  Diameter: 5 mm.  No evidence of choledocholithiasis.  Liver:  No focal lesion identified. Within normal limits in parenchymal echogenicity.  IMPRESSION: 1. Cholelithiasis, gallbladder wall thickening and positive sonographic Murphy sign worrisome for acute cholecystitis. 2. No biliary dilatation. 3. These results will be called to the ordering clinician or representative by the Radiologist Assistant, and communication documented in the PACS or zVision Dashboard.   Electronically Signed   By:  Camie Patience M.D.   On: 02/21/2014 09:28     EKG Interpretation None      MDM   Final diagnoses:  Calculus of gallbladder  with acute cholecystitis and obstruction  Abdominal pain, right upper quadrant  Recurrent ventral hernia  Anemia, unspecified anemia type  Hyperbilirubinemia  Thrombocytopenia  Bilirubinemia  Patient sent in with ultrasound showing cholecystitis and cholelithiasis, pain medicines ordered, no peritonitis on exam. Blood work pending. Discussed with general surgery who will evaluate the patient in the ER.  Surgery assessed the patient and recommended admission however recommended medicine admission as patient high risk for surgery and that decision is pending. Paged hospitalist. The patients results and plan were reviewed and discussed.   Any x-rays performed were personally reviewed by myself.   Differential diagnosis were considered with the presenting HPI.  Medications  0.9 %  sodium chloride infusion (not administered)  morphine 4 MG/ML injection 4 mg (not administered)    Filed Vitals:   02/22/14 1335  BP: 110/54  Pulse: 85  Temp: 98.5 F (36.9 C)  TempSrc: Oral  Resp: 20  Height: 5\' 2"  (1.575 m)  Weight: 205 lb (92.987 kg)  SpO2: 98%    Final diagnoses:  None    Patient signed out to follow until admitted.      Mariea Clonts, MD 02/22/14 1506  Mariea Clonts, MD 02/22/14 [phone]

## 2014-02-22 NOTE — ED Notes (Signed)
IV team at bedside 

## 2014-02-23 DIAGNOSIS — K811 Chronic cholecystitis: Secondary | ICD-10-CM

## 2014-02-23 DIAGNOSIS — I359 Nonrheumatic aortic valve disorder, unspecified: Secondary | ICD-10-CM

## 2014-02-23 DIAGNOSIS — R740 Nonspecific elevation of levels of transaminase and lactic acid dehydrogenase [LDH]: Secondary | ICD-10-CM

## 2014-02-23 DIAGNOSIS — D696 Thrombocytopenia, unspecified: Secondary | ICD-10-CM

## 2014-02-23 LAB — BASIC METABOLIC PANEL WITH GFR
Anion gap: 12 (ref 5–15)
BUN: 13 mg/dL (ref 6–23)
CO2: 29 meq/L (ref 19–32)
Calcium: 9 mg/dL (ref 8.4–10.5)
Chloride: 103 meq/L (ref 96–112)
Creatinine, Ser: 1 mg/dL (ref 0.50–1.10)
GFR calc Af Amer: 64 mL/min — ABNORMAL LOW
GFR calc non Af Amer: 55 mL/min — ABNORMAL LOW
Glucose, Bld: 115 mg/dL — ABNORMAL HIGH (ref 70–99)
Potassium: 3.7 meq/L (ref 3.7–5.3)
Sodium: 144 meq/L (ref 137–147)

## 2014-02-23 LAB — CBC
HCT: 25.6 % — ABNORMAL LOW (ref 36.0–46.0)
Hemoglobin: 8.2 g/dL — ABNORMAL LOW (ref 12.0–15.0)
MCH: 29.3 pg (ref 26.0–34.0)
MCHC: 32 g/dL (ref 30.0–36.0)
MCV: 91.4 fL (ref 78.0–100.0)
PLATELETS: 36 10*3/uL — AB (ref 150–400)
RBC: 2.8 MIL/uL — AB (ref 3.87–5.11)
RDW: 20.4 % — AB (ref 11.5–15.5)
WBC: 7.7 10*3/uL (ref 4.0–10.5)

## 2014-02-23 LAB — URINALYSIS, ROUTINE W REFLEX MICROSCOPIC
Bilirubin Urine: NEGATIVE
Glucose, UA: NEGATIVE mg/dL
HGB URINE DIPSTICK: NEGATIVE
Ketones, ur: NEGATIVE mg/dL
Leukocytes, UA: NEGATIVE
Nitrite: NEGATIVE
PH: 6 (ref 5.0–8.0)
Protein, ur: NEGATIVE mg/dL
SPECIFIC GRAVITY, URINE: 1.007 (ref 1.005–1.030)
Urobilinogen, UA: 1 mg/dL (ref 0.0–1.0)

## 2014-02-23 MED ORDER — ACETAMINOPHEN 325 MG PO TABS
650.0000 mg | ORAL_TABLET | Freq: Four times a day (QID) | ORAL | Status: DC | PRN
  Administered 2014-02-23 – 2014-02-25 (×3): 650 mg via ORAL
  Filled 2014-02-23 (×3): qty 2

## 2014-02-23 MED ORDER — TECHNETIUM TC 99M MEBROFENIN IV KIT: 5.5000 | PACK | Freq: Once | INTRAVENOUS | Status: AC | PRN

## 2014-02-23 NOTE — Progress Notes (Signed)
CARE MANAGEMENT NOTE 02/23/2014  Patient:  Stacey Cooley, Stacey Cooley   Account Number:  [id number]  Date Initiated:  02/23/2014  Documentation initiated by:  [facility]  Subjective/Objective Assessment:   Abdominal pain/chronic cholecystitis     Action/Plan:   lives at home with husband   Anticipated DC Date:  02/25/2014   Anticipated DC Plan:  [place]  CM consult      Choice offered to / List presented to:             Status of service:  Completed, signed off Medicare Important Message given?   (If response is "NO", the following Medicare IM given date fields will be blank) Date Medicare IM given:   Medicare IM given by:   Date Additional Medicare IM given:   Additional Medicare IM given by:    Discharge Disposition:  HOME/SELF CARE  Per UR Regulation:    If discussed at Long Length of Stay Meetings, dates discussed:    Comments:  02/23/2014 1545 NCM spoke to pt and gave permission to speak to husband. Husband states he will possible be out of town for two weeks and wanted info on private duty agencies to hire a personal care assistance. Provided a list of private duty agencies. Pt has RW and cane at home. Guss Bunde RN CCM Case Mgmt phone [phone]

## 2014-02-23 NOTE — Progress Notes (Signed)
PROGRESS NOTE  Stacey Cooley EZM:[id number] DOB: Oct 13, 1940 DOA: 02/22/2014 PCP: Lottie Mussel, MD Brief history 73 year old female with a history of lymphocytic leukemia, hypertension, hypothyroidism, chronic lower extremity edema presents with 2-3 weeks of worsening epigastric and right upper quadrant pain associated with intermittent dry heaving that occurs with meals. She also stated that the pain somewhat worsened with eating. In addition, the patient was noted to have increased LFTs and increasing bilirubin over the last few weeks. The patient's oncologist, Dr. Alvy Bimler, ordered an abdominal ultrasound which was suggestive of possible cholecystitis. As a result the patient was admitted, and general surgery was consulted. HIDA scan has been performed, and it was negative for acute cholecystitis, but suggested chronic cholecystitis. At this time, general surgery feels that the patient should be treated conservatively with antibiotics given her multiple comorbidities. Also recommended CT of the abdomen and pelvis if the patient's abdominal pain does not improve or worsens. Assessment/Plan: Abdominal pain/chronic cholecystitis -Appreciate general surgery followup -Continue antibiotics -WBC has improved -CT abdomen pelvis as recommended by surgery if no clinical improvement -Trend WBC and LFTs -HIDA negative for acute cholecystitis but suggests chronic cholecystitis Large granular lymphocytic leukemia -pt follows Dr. Alvy Bimler -continue IV stress steroids for today -transfuse platelets and packed RBCs as desired. Will defer platelet transfusion to Gen. surgery based on their comfort level, particularly if surgery is needed -presently no signs of active bleed -hold Methotrexate Hypertension -Continue metoprolol tartrate -Controlled Chronic lower extremity edema -Restart oral furosemide -presently denies any dyspnea, lungs are clear -check am BNP Hypothyroidism -Continue  Synthroid Gouty Arthritis -Continue allopurinol  Family Communication:   Pt at beside Disposition Plan:   Home when medically stable   Antibiotics:  Zosyn 02/22/14>>>    Procedures/Studies: Dg Chest 2 View  02/22/2014   CLINICAL DATA:  Preoperative exam prior to cholecystectomy; cough  EXAM: CHEST  2 VIEW  COMPARISON:  PA and lateral chest x-ray of August 02, 2012  FINDINGS: The lungs are adequately inflated. The interstitial markings are mildly increased bilaterally. The cardiopericardial silhouette is enlarged and the central pulmonary vascularity is engorged. There is tortuosity of the descending thoracic aorta. The bony thorax exhibits no acute abnormalities. The patient has undergone previous cervical and upper thoracic fusion.  IMPRESSION: There is low grade CHF with mild interstitial edema. There is no pleural effusion nor evidence of pneumonia.   Electronically Signed   By: Chaze Hruska  [place]   On: 02/22/2014 15:21   Nm Hepatobiliary  02/23/2014   CLINICAL DATA:  Abnormal liver function studies. Abdominal pain in the right upper quadrant with nausea for 3 weeks.  EXAM: NUCLEAR MEDICINE HEPATOBILIARY IMAGING  TECHNIQUE: Sequential images of the abdomen were obtained out to 60 minutes following intravenous administration of radiopharmaceutical.  RADIOPHARMACEUTICALS:  5.5 [employer] TK-35W Choletec  COMPARISON:  Ultrasound abdomen 02/21/2014.  FINDINGS: There is normal homogeneous uptake and distribution of activity throughout the liver with normal washout and prompt appearance of activity in the intrahepatic and extrahepatic bile ducts and small bowel. There is delayed appearance of activity in the gallbladder, seen only at 3-4 hr after injection. Findings are compatible with chronic cholecystitis. Acute cholecystitis can sometimes demonstrate delayed uptake as well. No evidence of biliary obstruction.  IMPRESSION: Delayed appearance of tracer activity in the gallbladder most consistent with  chronic cholecystitis. No evidence of biliary obstruction.   Electronically Signed   By: Lucienne Capers M.D.   On: 02/23/2014 04:30  US Abdomen Limited  02/21/2014   CLINICAL DATA:  Transaminitis. History of laparoscopic band surgery.  EXAM: US ABDOMEN LIMITED - RIGHT UPPER QUADRANT  COMPARISON:  Abdominal pelvic CT 03/12/2013.  FINDINGS: Gallbladder:  Gallstones are noted within the gallbladder neck. There is moderate gallbladder wall thickening to 7 mm. There is a small amount of pericholecystic fluid, and fluid is also present within Morison's pouch. Sonographic Percell Miller sign is positive according to the sonographer.  Common bile duct:  Diameter: 5 mm.  No evidence of choledocholithiasis.  Liver:  No focal lesion identified. Within normal limits in parenchymal echogenicity.  IMPRESSION: 1. Cholelithiasis, gallbladder wall thickening and positive sonographic Murphy sign worrisome for acute cholecystitis. 2. No biliary dilatation. 3. These results will be called to the ordering clinician or representative by the Radiologist Assistant, and communication documented in the PACS or zVision Dashboard.   Electronically Signed   By: Camie Patience M.D.   On: 02/21/2014 09:28         Subjective: Patient continues to complain of epigastric and right upper quadrant pain. Feels hungry. Denies any nausea, vomiting, diarrhea. Denies any fevers, chills, chest pain, shortness breath,  hemoptysis.  Objective: Filed Vitals:   02/22/14 1704 02/22/14 1740 02/22/14 2043 02/23/14 0540  BP: 108/47  93/49 112/54  Pulse: 62  66 65  Temp: 98 F (36.7 C)  98.1 F (36.7 C) 97.6 F (36.4 C)  TempSrc: Oral  Oral Axillary  Resp: 15  18 18   Height:  5\' 2"  (1.575 m)    Weight:  92.987 kg (205 lb)  95.4 kg (210 lb 5.1 oz)  SpO2: 95%  95% 94%    Intake/Output Summary (Last 24 hours) at 02/23/14 0916 Last data filed at 02/23/14 9629  Gross per 24 hour  Intake 738.33 ml  Output    300 ml  Net 438.33 ml   Weight  change:  Exam:   General:  Pt is alert, follows commands appropriately, not in acute distress  HEENT: No icterus, No thrush,  [place]/AT  Cardiovascular: RRR, S1/S2, no rubs, no gallops  Respiratory: CTA bilaterally, no wheezing, no crackles, no rhonchi  Abdomen: Soft/+BS, RUQ and epigastric tender, non distended, no guarding  Extremities: 2+LE edema, No lymphangitis, No petechiae, No rashes, no synovitis  Data Reviewed: Basic Metabolic Panel:  Recent Labs Lab 02/19/14 0844 02/22/14 1015 02/22/14 1402 02/23/14 0435  NA 139 141 139 144  K 4.1 3.5 3.6* 3.7  CL  --   --  98 103  CO2 30* 29 27 29   GLUCOSE 99 110 112* 115*  BUN 18.7 12.7 13 13   CREATININE 1.1 1.0 0.98 1.00  CALCIUM 9.4 9.4 9.3 9.0   Liver Function Tests:  Recent Labs Lab 02/19/14 0844 02/22/14 1015 02/22/14 1402  AST 71* 65* 66*  ALT 37 39 37*  ALKPHOS 155* 162* 156*  BILITOT 3.34* 3.43* 3.4*  PROT 5.3* 5.4* 5.7*  ALBUMIN 3.2* 3.2* 3.4*    Recent Labs Lab 02/22/14 1402  LIPASE 21   No results found for this basename: AMMONIA,  in the last 168 hours CBC:  Recent Labs Lab 02/19/14 0844 02/22/14 1015 02/22/14 1402 02/23/14 0435  WBC 7.6 11.4* 13.5* 7.7  NEUTROABS 1.1* 1.6 1.8  --   HGB 8.5* 8.8* 8.9* 8.2*  HCT 27.0* 28.2* 27.4* 25.6*  MCV 91.8 92.7 90.7 91.4  PLT 27* 51* 36* 36*   Cardiac Enzymes: No results found for this basename: CKTOTAL, CKMB, CKMBINDEX, TROPONINI,  in the last  168 hours BNP: No components found with this basename: POCBNP,  CBG: No results found for this basename: GLUCAP,  in the last 168 hours  Recent Results (from the past 240 hour(s))  TECHNOLOGIST REVIEW     Status: None   Collection Time    02/19/14  8:44 AM      Result Value Ref Range Status   Technologist Review Variant lymphs present   Final  TECHNOLOGIST REVIEW     Status: None   Collection Time    02/22/14 10:15 AM      Result Value Ref Range Status   Technologist Review Variant lymphs present,  rare myelocyte and nRBC    Final     Scheduled Meds: . allopurinol  300 mg Oral q morning - 10a  . citalopram  20 mg Oral q morning - 26V  . folic acid  1 mg Oral Daily  . hydrocortisone sod succinate (SOLU-CORTEF) inj  50 mg Intravenous Q12H  . levothyroxine  75 mcg Oral QAC breakfast  . metoprolol tartrate  25 mg Oral BID  .  morphine injection  4 mg Intravenous Once  . oxybutynin  5 mg Oral BID  . piperacillin-tazobactam (ZOSYN)  IV  3.375 g Intravenous 3 times per day  . sodium chloride  3 mL Intravenous Q12H   Continuous Infusions: . dextrose 5 % and 0.45 % NaCl with KCl 10 mEq/L 50 mL/hr at 02/22/14 1756     Madilynne Mullan, DO  [facility] Pager [phone]  If 7PM-7AM, please contact night-coverage [email or web address] Password [email or web address] 02/23/2014, 9:16 AM   LOS: 1 day

## 2014-02-23 NOTE — Progress Notes (Signed)
  Echocardiogram 2D Echocardiogram has been performed.  Stacey Cooley 02/23/2014, 10:29 AM

## 2014-02-23 NOTE — Progress Notes (Signed)
Subjective: She reports most of her pain is on the left side of her abdomen She denies RUQ pain today  Objective: Vital signs in last 24 hours: Temp:  [97.6 F (36.4 C)-98.5 F (36.9 C)] 97.6 F (36.4 C) (10/03 0540) Pulse Rate:  [60-85] 65 (10/03 0540) Resp:  [15-20] 18 (10/03 0540) BP: (93-112)/(47-54) 112/54 mmHg (10/03 0540) SpO2:  [94 %-100 %] 94 % (10/03 0540) Weight:  [205 lb (92.987 kg)-211 lb 4.8 oz (95.845 kg)] 210 lb 5.1 oz (95.4 kg) (10/03 0540) Last BM Date: 02/21/14  Intake/Output from previous day: 10/02 0701 - 10/03 0700 In: 738.3 [I.V.:638.3; IV Piggyback:100] Out: 300 [Urine:300] Intake/Output this shift:    Abdomen is soft with no real RUQ tenderness.  Most of the tenderness is left side and around her hernia which I can not reduce.  Lab Results:   Recent Labs  02/22/14 1402 02/23/14 0435  WBC 13.5* 7.7  HGB 8.9* 8.2*  HCT 27.4* 25.6*  PLT 36* 36*   BMET  Recent Labs  02/22/14 1402 02/23/14 0435  NA 139 144  K 3.6* 3.7  CL 98 103  CO2 27 29  GLUCOSE 112* 115*  BUN 13 13  CREATININE 0.98 1.00  CALCIUM 9.3 9.0   PT/INR No results found for this basename: LABPROT, INR,  in the last 72 hours ABG No results found for this basename: PHART, PCO2, PO2, HCO3,  in the last 72 hours  Studies/Results: Dg Chest 2 View  02/22/2014   CLINICAL DATA:  Preoperative exam prior to cholecystectomy; cough  EXAM: CHEST  2 VIEW  COMPARISON:  PA and lateral chest x-ray of August 02, 2012  FINDINGS: The lungs are adequately inflated. The interstitial markings are mildly increased bilaterally. The cardiopericardial silhouette is enlarged and the central pulmonary vascularity is engorged. There is tortuosity of the descending thoracic aorta. The bony thorax exhibits no acute abnormalities. The patient has undergone previous cervical and upper thoracic fusion.  IMPRESSION: There is low grade CHF with mild interstitial edema. There is no pleural effusion nor  evidence of pneumonia.   Electronically Signed   By: David  Martinique   On: 02/22/2014 15:21   Nm Hepatobiliary  02/23/2014   CLINICAL DATA:  Abnormal liver function studies. Abdominal pain in the right upper quadrant with nausea for 3 weeks.  EXAM: NUCLEAR MEDICINE HEPATOBILIARY IMAGING  TECHNIQUE: Sequential images of the abdomen were obtained out to 60 minutes following intravenous administration of radiopharmaceutical.  RADIOPHARMACEUTICALS:  5.5 Millicurie [id number] Choletec  COMPARISON:  Ultrasound abdomen 02/21/2014.  FINDINGS: There is normal homogeneous uptake and distribution of activity throughout the liver with normal washout and prompt appearance of activity in the intrahepatic and extrahepatic bile ducts and small bowel. There is delayed appearance of activity in the gallbladder, seen only at 3-4 hr after injection. Findings are compatible with chronic cholecystitis. Acute cholecystitis can sometimes demonstrate delayed uptake as well. No evidence of biliary obstruction.  IMPRESSION: Delayed appearance of tracer activity in the gallbladder most consistent with chronic cholecystitis. No evidence of biliary obstruction.   Electronically Signed   By: Lucienne Capers M.D.   On: 02/23/2014 04:30   US Abdomen Limited  02/21/2014   CLINICAL DATA:  Transaminitis. History of laparoscopic band surgery.  EXAM: US ABDOMEN LIMITED - RIGHT UPPER QUADRANT  COMPARISON:  Abdominal pelvic CT 03/12/2013.  FINDINGS: Gallbladder:  Gallstones are noted within the gallbladder neck. There is moderate gallbladder wall thickening to 7 mm. There is Cooley small amount of  pericholecystic fluid, and fluid is also present within Morison's pouch. Sonographic Percell Miller sign is positive according to the sonographer.  Common bile duct:  Diameter: 5 mm.  No evidence of choledocholithiasis.  Liver:  No focal lesion identified. Within normal limits in parenchymal echogenicity.  IMPRESSION: 1. Cholelithiasis, gallbladder wall thickening and  positive sonographic Murphy sign worrisome for acute cholecystitis. 2. No biliary dilatation. 3. These results will be called to the ordering clinician or representative by the Radiologist Assistant, and communication documented in the PACS or zVision Dashboard.   Electronically Signed   By: Camie Patience M.D.   On: 02/21/2014 09:28    Anti-infectives: Anti-infectives   Start     Dose/Rate Route Frequency Ordered Stop   02/22/14 2200  piperacillin-tazobactam (ZOSYN) IVPB 3.375 g     3.375 g 12.5 mL/hr over 240 Minutes Intravenous 3 times per day 02/22/14 1549     02/22/14 1600  piperacillin-tazobactam (ZOSYN) IVPB 3.375 g     3.375 g 100 mL/hr over 30 Minutes Intravenous STAT 02/22/14 1547 02/22/14 1736      Assessment/Plan:  Leukemia with low platelets, abdominal pain  The HIDA is really negative for acute cholecystitis but she probably does have chronic cholecystitis.  Given her physical exam, I am inclined to treat her conservatively with antibiotics alone and hold on even Cooley percutaneous cholecystostomy given her low platelets. I will start clear liquids today.  If her pain worsens, I think she will need Cooley CT to re-evaluate the hernia.  LOS: 1 day    Stacey Cooley 02/23/2014

## 2014-02-24 DIAGNOSIS — R600 Localized edema: Secondary | ICD-10-CM

## 2014-02-24 LAB — COMPREHENSIVE METABOLIC PANEL
ALK PHOS: 128 U/L — AB (ref 39–117)
ALT: 30 U/L (ref 0–35)
ANION GAP: 14 (ref 5–15)
AST: 43 U/L — ABNORMAL HIGH (ref 0–37)
Albumin: 3.2 g/dL — ABNORMAL LOW (ref 3.5–5.2)
BUN: 16 mg/dL (ref 6–23)
CO2: 25 mEq/L (ref 19–32)
Calcium: 8.9 mg/dL (ref 8.4–10.5)
Chloride: 99 mEq/L (ref 96–112)
Creatinine, Ser: 1.05 mg/dL (ref 0.50–1.10)
GFR, EST AFRICAN AMERICAN: 60 mL/min — AB (ref >90)
GFR, EST NON AFRICAN AMERICAN: 52 mL/min — AB (ref >90)
Glucose, Bld: 134 mg/dL — ABNORMAL HIGH (ref 70–99)
Potassium: 3.6 mEq/L — ABNORMAL LOW (ref 3.7–5.3)
Sodium: 138 mEq/L (ref 137–147)
TOTAL PROTEIN: 5.5 g/dL — AB (ref 6.0–8.3)
Total Bilirubin: 2.9 mg/dL — ABNORMAL HIGH (ref 0.3–1.2)

## 2014-02-24 LAB — CBC
HCT: 25.6 % — ABNORMAL LOW (ref 36.0–46.0)
HEMOGLOBIN: 8.4 g/dL — AB (ref 12.0–15.0)
MCH: 29.6 pg (ref 26.0–34.0)
MCHC: 32.8 g/dL (ref 30.0–36.0)
MCV: 90.1 fL (ref 78.0–100.0)
PLATELETS: 39 10*3/uL — AB (ref 150–400)
RBC: 2.84 MIL/uL — ABNORMAL LOW (ref 3.87–5.11)
RDW: 20.2 % — ABNORMAL HIGH (ref 11.5–15.5)
WBC: 7 10*3/uL (ref 4.0–10.5)

## 2014-02-24 MED ORDER — BOOST / RESOURCE BREEZE PO LIQD
1.0000 | Freq: Three times a day (TID) | ORAL | Status: DC
  Administered 2014-02-24 – 2014-02-25 (×3): 1 via ORAL

## 2014-02-24 MED ORDER — HYDROCORTISONE NA SUCCINATE PF 100 MG IJ SOLR
50.0000 mg | Freq: Every day | INTRAMUSCULAR | Status: DC
  Administered 2014-02-25 – 2014-02-28 (×4): 50 mg via INTRAVENOUS
  Filled 2014-02-24 (×4): qty 1

## 2014-02-24 NOTE — Progress Notes (Signed)
INITIAL NUTRITION ASSESSMENT  DOCUMENTATION CODES Per approved criteria  -Obesity Unspecified   INTERVENTION: -Resource Breeze TID with meals, each provides 250 kcal, 9 g protein  NUTRITION DIAGNOSIS: Inadequate oral intake related to diarrhea as evidenced by clear liquid diet.   Goal: Patient will meet >/=90% of estimated nutrition needs  Monitor:  Diet advancement, bowel function, PO intake, weight, labs  Reason for Assessment: Malnutrition screening tool  73 y.o. female  Admitting Dx: Cholecystitis  ASSESSMENT: 73 y.o. 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 She was experiencing some epigastric/right upper quadrant pain worse by eating food better with bowel rest and defecation, Right upper quadrant ultrasound showed evidence of cholecystitis.   Patient reports that she has had a decrease in appetite for the last few months. She has lost about 23 pounds in the last year likely secondary to leukemia and history of Roux-en-Y bypass. She is currently eating 100% of clear liquid diet, but complains of diarrhea. No fat or muscle depletion noted.   Height: Ht Readings from Last 1 Encounters:  02/22/14 5\' 2"  (1.575 m)    Weight: Wt Readings from Last 1 Encounters:  02/24/14 207 lb 12.8 oz (94.257 kg)    Ideal Body Weight: 110 pounds  % Ideal Body Weight: 188%  Wt Readings from Last 10 Encounters:  02/24/14 207 lb 12.8 oz (94.257 kg)  02/22/14 211 lb 4.8 oz (95.845 kg)  02/12/14 204 lb 11.2 oz (92.851 kg)  01/21/14 215 lb (97.523 kg)  12/18/13 210 lb 6.4 oz (95.437 kg)  11/13/13 216 lb 3.2 oz (98.068 kg)  10/10/13 221 lb 12.8 oz (100.608 kg)  09/10/13 224 lb 12.8 oz (101.969 kg)  08/27/13 217 lb 1.6 oz  (98.476 kg)  08/14/13 223 lb 1.6 oz (101.197 kg)    Usual Body Weight: 230 pounds  % Usual Body Weight: 90%  BMI:  Body mass index is 38 kg/(m^2). Patient is obese, class I  Estimated Nutritional Needs: Kcal: 1950-2100 kcal Protein: 110-120 g Fluid: >2.1 L/day  Skin: Intact  Diet Order: Clear Liquid  EDUCATION NEEDS: -No education needs identified at this time   Intake/Output Summary (Last 24 hours) at 02/24/14 1349 Last data filed at 02/24/14 1348  Gross per 24 hour  Intake 1990.83 ml  Output   2600 ml  Net -609.17 ml    Last BM: 10/4   Labs:   Recent Labs Lab 02/22/14 1402 02/23/14 0435 02/24/14 0500  NA 139 144 138  K 3.6* 3.7 3.6*  CL 98 103 99  CO2 27 29 25   BUN 13 13 16   CREATININE 0.98 1.00 1.05  CALCIUM 9.3 9.0 8.9  GLUCOSE 112* 115* 134*    CBG (last 3)  No results found for this basename: GLUCAP,  in the last 72 hours  Scheduled Meds: . allopurinol  300 mg Oral q morning - 10a  . citalopram  20 mg Oral q morning - 67Y  . folic acid  1 mg Oral Daily  . hydrocortisone sod succinate (SOLU-CORTEF) inj  50 mg Intravenous Q12H  . levothyroxine  75 mcg Oral QAC breakfast  . metoprolol tartrate  25 mg Oral BID  .  morphine injection  4 mg Intravenous Once  . oxybutynin  5 mg Oral BID  . piperacillin-tazobactam (ZOSYN)  IV  3.375 g Intravenous  3 times per day  . sodium chloride  3 mL Intravenous Q12H    Continuous Infusions:   Past Medical History  Diagnosis Date  . HTN (hypertension)   . Morbid obesity   . Rheumatic fever     child  . Dyslipidemia   . Swelling of joint of lower leg     recurrent lower extremity leg swelling, noted by pt  . Anemia   . Hypothyroid   . Varicose vein of leg   . MRSA (methicillin resistant staph aureus) culture positive   . TACHYCARDIA 10/17/2009    Qualifier: Diagnosis of  By: Rockey Situ MD, Tim    . Anxiety   . Cough   . Heart murmur   . Renal insufficiency 09/06/2011    DR. HA  [facility]    . Uterine cancer 1996    s/p hysterectomy; patient reported low stage; did not need adjuvant chemo  or radiation.   . DDD (degenerative disc disease), cervical   . 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 Lymphocytosis 03/12/2013  . Other pancytopenia 03/12/2013  . Weight loss 03/12/2013  . 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 Bilateral leg edema 04/09/2013  . Nausea alone 04/09/2013  . Creatinine elevation 04/16/2013  . Renal failure, acute 05/11/2013  . Sinus congestion 07/09/2013  . Headache 07/17/2013  . Abnormal liver function tests 02/19/2014  . Cholecystitis 02/21/2014    Past Surgical History  Procedure Laterality Date  . Anterior cervical decompression and fusion  09/2009    involving C4-7 levels as well as a posterior cervical fusion prcedure from C3 to T  . Tonsillectomy  1966  . Gastric stapling  12/1984 and 2001  . Abdominal hysterectomy  1996  . Right hip replacement  1997  . Right ulnar nerve decompression  2005  . Right carpal tunnel release  2005  . Total left knee replacement  2006  . Halo application  66/29/4765    Procedure: HALO TRACTION APPLICATION;  Surgeon: Sinclair Ship, MD;  Location: [facility];  Service: Orthopedics;  Laterality: N/A;  . Joint replacement      RIGHT KNEE   . No past surgeries      RIGHT THUMB   . Ulnar nerve repair      RIGHT ARM 1.5 YRS AGO   . Tooth extraction      OSTEO NECROSIS    IN JAW    . Posterior cervical fusion/foraminotomy N/A 08/03/2012    Procedure: POSTERIOR CERVICAL FUSION/FORAMINOTOMY LEVEL 2;  Surgeon: Sinclair Ship, MD;  Location: [facility];  Service: Orthopedics;  Laterality: N/A;  Posterior cervical fusion, cerival 1-2, cervical 2-3 with instrumentation, iliac crest autograft  . Right hip replacement    . Gastric roux-en-y      Larey Seat, RD, LDN Pager #:  [phone] After-Hours Pager #: [phone]

## 2014-02-24 NOTE — Progress Notes (Signed)
Subjective: Comfortable this morning but still having epigastric and bilateral upper quadrant pain. Had nausea with liquids  Loose BM's  Objective: Vital signs in last 24 hours: Temp:  [97.8 F (36.6 C)-98.2 F (36.8 C)] 97.8 F (36.6 C) (10/04 0434) Pulse Rate:  [54-82] 54 (10/04 0434) Resp:  [18-20] 20 (10/04 0434) BP: (95-110)/(47-82) 108/67 mmHg (10/04 0434) SpO2:  [96 %-99 %] 99 % (10/04 0434) Weight:  [207 lb 12.8 oz (94.257 kg)] 207 lb 12.8 oz (94.257 kg) (10/04 0446) Last BM Date: 02/23/14  Intake/Output from previous day: 10/03 0701 - 10/04 0700 In: 1150.8 [P.O.:600; I.V.:400.8; IV Piggyback:150] Out: 1900 [Urine:1900] Intake/Output this shift:    Soft, tender with guarding in the epigastrium, less so in the RUQ and at the hernia which is chronically incarcerated  Lab Results:   Recent Labs  02/23/14 0435 02/24/14 0500  WBC 7.7 7.0  HGB 8.2* 8.4*  HCT 25.6* 25.6*  PLT 36* 39*   BMET  Recent Labs  02/23/14 0435 02/24/14 0500  NA 144 138  K 3.7 3.6*  CL 103 99  CO2 29 25  GLUCOSE 115* 134*  BUN 13 16  CREATININE 1.00 1.05  CALCIUM 9.0 8.9   PT/INR No results found for this basename: LABPROT, INR,  in the last 72 hours ABG No results found for this basename: PHART, PCO2, PO2, HCO3,  in the last 72 hours  Studies/Results: Dg Chest 2 View  02/22/2014   CLINICAL DATA:  Preoperative exam prior to cholecystectomy; cough  EXAM: CHEST  2 VIEW  COMPARISON:  PA and lateral chest x-ray of August 02, 2012  FINDINGS: The lungs are adequately inflated. The interstitial markings are mildly increased bilaterally. The cardiopericardial silhouette is enlarged and the central pulmonary vascularity is engorged. There is tortuosity of the descending thoracic aorta. The bony thorax exhibits no acute abnormalities. The patient has undergone previous cervical and upper thoracic fusion.  IMPRESSION: There is low grade CHF with mild interstitial edema. There is no pleural  effusion nor evidence of pneumonia.   Electronically Signed   By: David  Martinique   On: 02/22/2014 15:21   Nm Hepatobiliary  02/23/2014   CLINICAL DATA:  Abnormal liver function studies. Abdominal pain in the right upper quadrant with nausea for 3 weeks.  EXAM: NUCLEAR MEDICINE HEPATOBILIARY IMAGING  TECHNIQUE: Sequential images of the abdomen were obtained out to 60 minutes following intravenous administration of radiopharmaceutical.  RADIOPHARMACEUTICALS:  5.5 Millic[employer]ie XT-02I Choletec  COMPARISON:  Ultrasound abdomen 02/21/2014.  FINDINGS: There is normal homogeneous uptake and distribution of activity throughout the liver with normal washout and prompt appearance of activity in the intrahepatic and extrahepatic bile ducts and small bowel. There is delayed appearance of activity in the gallbladder, seen only at 3-4 hr after injection. Findings are compatible with chronic cholecystitis. Acute cholecystitis can sometimes demonstrate delayed uptake as well. No evidence of biliary obstruction.  IMPRESSION: Delayed appearance of tracer activity in the gallbladder most consistent with chronic cholecystitis. No evidence of biliary obstruction.   Electronically Signed   By: Lucienne Capers M.D.   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

## 2014-02-24 NOTE — Progress Notes (Signed)
PROGRESS NOTE  Stacey Cooley WHQ:[id number] DOB: [birth year] DOA: 02/22/2014 PCP: Lottie Mussel, MD  Brief history  73 year old female with a history of lymphocytic leukemia, hypertension, hypothyroidism, chronic lower extremity edema presents with 2-3 weeks of worsening epigastric and right upper quadrant pain associated with intermittent dry heaving that occurs with meals. She also stated that the pain somewhat worsened with eating. In addition, the patient was noted to have increased LFTs and increasing bilirubin over the last few weeks. The patient's oncologist, Dr. Alvy Bimler, ordered an abdominal ultrasound which was suggestive of possible cholecystitis. As a result the patient was admitted, and general surgery was consulted. HIDA scan has been performed, and it was negative for acute cholecystitis, but suggested chronic cholecystitis. At this time, general surgery feels that the patient should be treated conservatively with antibiotics given her multiple comorbidities. 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 Alvy Bimler  -continue IV stress steroids for today  -transfuse platelets and packed RBCs as desired. Will defer platelet transfusion to Gen. surgery based on their comfort level, particularly if surgery is needed  -presently no signs of active bleed  -hold Methotrexate  -continue stress steroids Diarrhea -C.diff PCR Hypertension  -d/c metoprolol tartrate as BP is soft Chronic lower extremity edema/diastolic dysfunction    -Restart oral furosemide  -presently denies any dyspnea, lungs are clear  -02/23/2001 echo EF 65-99%, grade 1 diastolic dysfunction, no WMA Hypothyroidism  -Continue Synthroid  Gouty Arthritis  -Continue allopurinol  Family Communication: Husband updated at beside  Disposition Plan: Home when medically stable  Antibiotics:  Zosyn 02/22/14>>>     Procedures/Studies: Dg Chest 2 View  02/22/2014   CLINICAL DATA:  Preoperative exam prior to cholecystectomy; cough  EXAM: CHEST  2 VIEW  COMPARISON:  PA and lateral chest x-ray of August 02, 2012  FINDINGS: The lungs are adequately inflated. The interstitial markings are mildly increased bilaterally. The cardiopericardial silhouette is enlarged and the central pulmonary vascularity is engorged. There is tortuosity of the descending thoracic aorta. The bony thorax exhibits no acute abnormalities. The patient has undergone previous cervical and upper thoracic fusion.  IMPRESSION: There is low grade CHF with mild interstitial edema. There is no pleural effusion nor evidence of pneumonia.   Electronically Signed   By: Maya Arcand  [place]   On: 02/22/2014 15:21   Nm Hepatobiliary  02/23/2014   CLINICAL DATA:  Abnormal liver function studies. Abdominal pain in the right upper quadrant with nausea for 3 weeks.  EXAM: NUCLEAR MEDICINE HEPATOBILIARY IMAGING  TECHNIQUE: Sequential images of the abdomen were obtained out to 60 minutes following intravenous administration of radiopharmaceutical.  RADIOPHARMACEUTICALS:  5.5 Millic[employer]ie [id number] Choletec  COMPARISON:  Ultrasound abdomen 02/21/2014.  FINDINGS: There is normal homogeneous uptake and distribution of activity throughout the liver with normal washout and prompt appearance of activity in the intrahepatic and extrahepatic bile ducts and small bowel. There is delayed appearance of activity in the gallbladder, seen only at 3-4 hr after injection. Findings are compatible with chronic cholecystitis. Acute cholecystitis  can sometimes demonstrate  delayed uptake as well. No evidence of biliary obstruction.  IMPRESSION: Delayed appearance of tracer activity in the gallbladder most consistent with chronic cholecystitis. No evidence of biliary obstruction.   Electronically Signed   By: Lucienne Capers M.D.   On: 02/23/2014 04:30   US Abdomen Limited  02/21/2014   CLINICAL DATA:  Transaminitis. History of laparoscopic band surgery.  EXAM: US ABDOMEN LIMITED - RIGHT UPPER QUADRANT  COMPARISON:  Abdominal pelvic CT 03/12/2013.  FINDINGS: Gallbladder:  Gallstones are noted within the gallbladder neck. There is moderate gallbladder wall thickening to 7 mm. There is a small amount of pericholecystic fluid, and fluid is also present within Morison's pouch. Sonographic Percell Miller sign is positive according to the sonographer.  Common bile duct:  Diameter: 5 mm.  No evidence of choledocholithiasis.  Liver:  No focal lesion identified. Within normal limits in parenchymal echogenicity.  IMPRESSION: 1. Cholelithiasis, gallbladder wall thickening and positive sonographic Murphy sign worrisome for acute cholecystitis. 2. No biliary dilatation. 3. These results will be called to the ordering clinician or representative by the Radiologist Assistant, and communication documented in the PACS or zVision Dashboard.   Electronically Signed   By: Camie Patience M.D.   On: 02/21/2014 09:28         Subjective: Patient continues to complain of abdominal pain ventricular with any type of oral intake. She had loose stools today. Denies any fevers, chills, chest pain, shortness breath, dysuria, hematuria. Patient has some nausea without vomiting.  Objective: Filed Vitals:   02/24/14 0434 02/24/14 0446 02/24/14 1355 02/24/14 1414  BP: 108/67  85/54 90/60  Pulse: 54  56   Temp: 97.8 F (36.6 C)  98 F (36.7 C)   TempSrc: Oral  Oral   Resp: 20  18   Height:      Weight:  94.257 kg (207 lb 12.8 oz)    SpO2: 99%  100%     Intake/Output Summary  (Last 24 hours) at 02/24/14 1719 Last data filed at 02/24/14 1415  Gross per 24 hour  Intake   1350 ml  Output   2200 ml  Net   -850 ml   Weight change: 1.27 kg (2 lb 12.8 oz) Exam:   General:  Pt is alert, follows commands appropriately, not in acute distress  HEENT: No icterus, No thrush,  [place]/AT  Cardiovascular: RRR, S1/S2, no rubs, no gallops  Respiratory: CTA bilaterally, no wheezing, no crackles, no rhonchi  Abdomen: Soft/+BS, epigastric and periumbilical tenderness without any potential sign., non distended, no guarding  Extremities: 2+LE edema, No lymphangitis, No petechiae, No rashes, no synovitis  Data Reviewed: Basic Metabolic Panel:  Recent Labs Lab 02/19/14 0844 02/22/14 1015 02/22/14 1402 02/23/14 0435 02/24/14 0500  NA 139 141 139 144 138  K 4.1 3.5 3.6* 3.7 3.6*  CL  --   --  98 103 99  CO2 30* 29 27 29 25   GLUCOSE 99 110 112* 115* 134*  BUN 18.7 12.7 13 13 16   CREATININE 1.1 1.0 0.98 1.00 1.05  CALCIUM 9.4 9.4 9.3 9.0 8.9   Liver Function Tests:  Recent Labs Lab 02/19/14 0844 02/22/14 1015 02/22/14 1402 02/24/14 0500  AST 71* 65* 66* 43*  ALT 37 39 37* 30  ALKPHOS 155* 162* 156* 128*  BILITOT 3.34* 3.43* 3.4* 2.9*  PROT 5.3* 5.4* 5.7* 5.5*  ALBUMIN 3.2* 3.2* 3.4* 3.2*    Recent Labs Lab 02/22/14 1402  LIPASE 21   No results found for this basename: AMMONIA,  in the last 168 hours CBC:  Recent Labs Lab 02/19/14 0844 02/22/14 1015 02/22/14 1402 02/23/14 0435 02/24/14 0500  WBC 7.6 11.4* 13.5* 7.7 7.0  NEUTROABS 1.1* 1.6 1.8  --   --   HGB 8.5* 8.8* 8.9* 8.2* 8.4*  HCT 27.0* 28.2* 27.4* 25.6* 25.6*  MCV 91.8 92.7 90.7 91.4 90.1  PLT 27* 51* 36* 36* 39*   Cardiac Enzymes: No results found for this basename: CKTOTAL, CKMB, CKMBINDEX, TROPONINI,  in the last 168 hours BNP: No components found with this basename: POCBNP,  CBG: No results found for this basename: GLUCAP,  in the last 168 hours  Recent Results (from the  past 240 hour(s))  TECHNOLOGIST REVIEW     Status: None   Collection Time    02/19/14  8:44 AM      Result Value Ref Range Status   Technologist Review Variant lymphs present   Final  TECHNOLOGIST REVIEW     Status: None   Collection Time    02/22/14 10:15 AM      Result Value Ref Range Status   Technologist Review Variant lymphs present, rare myelocyte and nRBC    Final  CULTURE, BLOOD (ROUTINE X 2)     Status: None   Collection Time    02/22/14  4:03 PM      Result Value Ref Range Status   Specimen Description BLOOD RIGHT ANTECUBITAL   Final   Special Requests BOTTLES DRAWN AEROBIC AND ANAEROBIC 10 CC   Final   Culture  Setup Time     Final   Value: 02/22/2014 22:16     Performed at [facility]   Culture     Final   Value:        BLOOD CULTURE RECEIVED NO GROWTH TO DATE CULTURE WILL BE HELD FOR 5 DAYS BEFORE ISSUING A FINAL NEGATIVE REPORT     Performed at [facility]   Report Status PENDING   Incomplete  CULTURE, BLOOD (ROUTINE X 2)     Status: None   Collection Time    02/22/14  4:08 PM      Result Value Ref Range Status   Specimen Description BLOOD RIGHT ANTECUBITAL   Final   Special Requests BOTTLES DRAWN AEROBIC AND ANAEROBIC 10 CC   Final   Culture  Setup Time     Final   Value: 02/22/2014 22:15     Performed at [facility]   Culture     Final   Value:        BLOOD CULTURE RECEIVED NO GROWTH TO DATE CULTURE WILL BE HELD FOR 5 DAYS BEFORE ISSUING A FINAL NEGATIVE REPORT     Performed at [facility]   Report Status PENDING   Incomplete     Scheduled Meds: . allopurinol  300 mg Oral q morning - 10a  . citalopram  20 mg Oral q morning - 10a  . feeding supplement (RESOURCE BREEZE)  1 Container Oral TID WC  . folic acid  1 mg Oral Daily  . hydrocortisone sod succinate (SOLU-CORTEF) inj  50 mg Intravenous Q12H  . levothyroxine  75 mcg Oral QAC breakfast  . metoprolol tartrate  25 mg Oral BID  .  morphine injection  4 mg Intravenous  Once  . oxybutynin  5 mg Oral BID  . piperacillin-tazobactam (ZOSYN)  IV  3.375 g Intravenous 3 times per day  . sodium chloride  3 mL Intravenous Q12H   Continuous Infusions:  Leigh Kaeding, DO  [facility] Pager [phone]  If 7PM-7AM, please contact night-coverage [email or web address] Password [email or web address] 02/24/2014, 5:19 PM   LOS: 2 days

## 2014-02-25 ENCOUNTER — Encounter (HOSPITAL_COMMUNITY): Payer: Self-pay | Admitting: Radiology

## 2014-02-25 ENCOUNTER — Inpatient Hospital Stay (HOSPITAL_COMMUNITY): Payer: Medicare Other

## 2014-02-25 DIAGNOSIS — I5189 Other ill-defined heart diseases: Secondary | ICD-10-CM

## 2014-02-25 DIAGNOSIS — D696 Thrombocytopenia, unspecified: Secondary | ICD-10-CM

## 2014-02-25 DIAGNOSIS — K819 Cholecystitis, unspecified: Secondary | ICD-10-CM

## 2014-02-25 DIAGNOSIS — D6959 Other secondary thrombocytopenia: Secondary | ICD-10-CM

## 2014-02-25 DIAGNOSIS — R1011 Right upper quadrant pain: Secondary | ICD-10-CM

## 2014-02-25 DIAGNOSIS — R933 Abnormal findings on diagnostic imaging of other parts of digestive tract: Secondary | ICD-10-CM

## 2014-02-25 DIAGNOSIS — D630 Anemia in neoplastic disease: Secondary | ICD-10-CM

## 2014-02-25 DIAGNOSIS — K3189 Other diseases of stomach and duodenum: Secondary | ICD-10-CM

## 2014-02-25 DIAGNOSIS — C91Z0 Other lymphoid leukemia not having achieved remission: Secondary | ICD-10-CM

## 2014-02-25 DIAGNOSIS — R197 Diarrhea, unspecified: Secondary | ICD-10-CM

## 2014-02-25 DIAGNOSIS — K8020 Calculus of gallbladder without cholecystitis without obstruction: Secondary | ICD-10-CM

## 2014-02-25 DIAGNOSIS — R109 Unspecified abdominal pain: Secondary | ICD-10-CM

## 2014-02-25 LAB — COMPREHENSIVE METABOLIC PANEL
ALBUMIN: 3.1 g/dL — AB (ref 3.5–5.2)
ALT: 26 U/L (ref 0–35)
AST: 33 U/L (ref 0–37)
Alkaline Phosphatase: 111 U/L (ref 39–117)
Anion gap: 13 (ref 5–15)
BUN: 22 mg/dL (ref 6–23)
CALCIUM: 8.6 mg/dL (ref 8.4–10.5)
CO2: 26 mEq/L (ref 19–32)
Chloride: 102 mEq/L (ref 96–112)
Creatinine, Ser: 1.08 mg/dL (ref 0.50–1.10)
GFR calc Af Amer: 58 mL/min — ABNORMAL LOW (ref >90)
GFR, EST NON AFRICAN AMERICAN: 50 mL/min — AB (ref >90)
Glucose, Bld: 93 mg/dL (ref 70–99)
Potassium: 3.8 mEq/L (ref 3.7–5.3)
SODIUM: 141 meq/L (ref 137–147)
TOTAL PROTEIN: 5.3 g/dL — AB (ref 6.0–8.3)
Total Bilirubin: 2.8 mg/dL — ABNORMAL HIGH (ref 0.3–1.2)

## 2014-02-25 LAB — CBC
HEMATOCRIT: 23.3 % — AB (ref 36.0–46.0)
HEMOGLOBIN: 7.7 g/dL — AB (ref 12.0–15.0)
MCH: 29.8 pg (ref 26.0–34.0)
MCHC: 33 g/dL (ref 30.0–36.0)
MCV: 90.3 fL (ref 78.0–100.0)
Platelets: 39 10*3/uL — ABNORMAL LOW (ref 150–400)
RBC: 2.58 MIL/uL — AB (ref 3.87–5.11)
RDW: 20.3 % — ABNORMAL HIGH (ref 11.5–15.5)
WBC: 5.6 10*3/uL (ref 4.0–10.5)

## 2014-02-25 LAB — CLOSTRIDIUM DIFFICILE BY PCR: CDIFFPCR: NEGATIVE

## 2014-02-25 MED ORDER — IOHEXOL 300 MG/ML  SOLN
25.0000 mL | INTRAMUSCULAR | Status: AC
  Administered 2014-02-25: 25 mL via ORAL

## 2014-02-25 MED ORDER — SODIUM CHLORIDE 0.9 % IV SOLN: Freq: Once | INTRAVENOUS | Status: AC

## 2014-02-25 MED ORDER — IOHEXOL 300 MG/ML  SOLN
100.0000 mL | Freq: Once | INTRAMUSCULAR | Status: AC | PRN
  Administered 2014-02-25: 100 mL via INTRAVENOUS

## 2014-02-25 NOTE — Consult Note (Signed)
Referring Provider: No ref. provider found Primary Care Physician:  Lottie Mussel, MD Primary Gastroenterologist:  Dr. Gustavo Lah in [place]  Reason for Consultation:  Abnormal CT scan with gastric and colonic wall thickening  HPI: Stacey Cooley is a 73 y.o. 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  She was being seen by Dr.Gorsuch in undergoing treatment for leukemia, her lab work was showing elevated total bilirubin and slightly elevated liver enzymes for a few weeks, she was also experiencing some epigastric/right upper quadrant pain worse by eating food, better with bowel rest and defecation, her oncologist ordered a right upper quadrant ultrasound which showed evidence of cholecystitis, she then consulted general surgeon, Dr. Donne Hazel, over the phone who recommended that patient come to the ER.   In the ER her diagnosis was again cholecystitis, general surgery was consulted and hospitalist team was requested to admit the patient.  She underwent HIDA scan, which showed delayed appearance of the tracer activity in the gallbladder most consistent with chronic cholecystitis.  She is a high risk surgical candidate and surgery still was not convinced that her gallbladder was the issue so CT scan of the abdomen and pelvis with contrast was ordered.  This revealed the following:  "IMPRESSION:    1. Mild colonic wall thickening involving the transverse and  descending colon most concerning for colitis secondary to infectious or inflammatory.  2. Severe gastric wall thickening with a moderate amount of fluid in the excluded portion of the stomach. This may reflect severe  gastritis versus less likely gastric malignancy. Prior gastric  bypass is noted without obstruction.  3. Cholelithiasis with mild gallbladder wall thickening. These  findings can be seen with cholecystitis, but can also be reactive  secondary to adjacent inflammatory process given the abnormality involving the stomach and colon. If there is concern regarding cholecystitis, further evaluation with HIDA scan is recommended."  GI is being consulted regarding these abnormal findings.  Her last colonoscopy was in 08/2010 by Dr. Gustavo Lah in [place] at which time her prep was poor and she had a tortuous colon; there was also one non-bleeding AVM.  Repeat was recommended in 5 years from that time.  Cdiff is negative.  Her LFT's were elevated with total bili reaching 3.4 just 3 days ago but down to 2.8 today and remaining LFT's have normalized.  Platelets are 39, Hgb 7.7 grams.  She admits that she has not felt well in about 3 weeks.  Dry heaves and pain in her entire upper abdomen.  Vomited only small amounts of clear liquid a couple of times at home but no vomiting since admission.  Tolerating clear liquids here.  Also has been having diarrhea, mostly in the past couple of days even on a clear liquid diet.  Cdiff negative as stated above.  Denies NSAID use.  Is on prednisone 10 mg daily and was on a baby ASA up until a couple of weeks ago when that was discontinued.  Past Medical History  Diagnosis Date  . HTN (  hypertension)   . Morbid obesity   . Rheumatic fever     child  . Dyslipidemia   . Swelling of joint of lower leg     recurrent lower extremity leg swelling, noted by pt  . Anemia   . Hypothyroid   . Varicose  vein of leg   . MRSA (methicillin resistant staph aureus) culture positive   . TACHYCARDIA 10/17/2009    Qualifier: Diagnosis of  By: Rockey Situ MD, Tim    . Anxiety   . Cough   . Heart murmur   . Renal insufficiency 09/06/2011    DR. HA  [facility]   . Uterine cancer 1996    s/p hysterectomy; patient reported low stage; did not need adjuvant chemo  or radiation.   . DDD (degenerative disc disease), cervical   . 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 Lymphocytosis 03/12/2013  . Other pancytopenia 03/12/2013  . Weight loss 03/12/2013  . 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 Bilateral leg edema 04/09/2013  . Nausea alone 04/09/2013  . Creatinine elevation 04/16/2013  . Renal failure, acute 05/11/2013  . Sinus congestion 07/09/2013  . Headache 07/17/2013  . Abnormal liver function tests 02/19/2014  . Cholecystitis 02/21/2014    Past Surgical History  Procedure Laterality Date  . Anterior cervical decompression and fusion  09/2009    involving C4-7 levels as well as a posterior cervical fusion prcedure from C3 to T  . Tonsillectomy  1966  . Gastric stapling  12/1984 and 2001  . Abdominal hysterectomy  1996  . Right hip replacement  1997  . Right ulnar nerve decompression  2005  . Right carpal tunnel release  2005  . Total left knee replacement  2006  . Halo application  98/92/1194    Procedure: HALO TRACTION APPLICATION;  Surgeon: Sinclair Ship, MD;  Location: [facility];  Service: Orthopedics;  Laterality: N/A;  . Joint replacement      RIGHT KNEE   . No past surgeries      RIGHT THUMB   . Ulnar nerve repair      RIGHT ARM 1.5 YRS AGO   . Tooth extraction      OSTEO NECROSIS    IN JAW    . Posterior cervical fusion/foraminotomy N/A 08/03/2012    Procedure: POSTERIOR CERVICAL FUSION/FORAMINOTOMY LEVEL 2;  Surgeon: Sinclair Ship, MD;  Location: [facility];  Service: Orthopedics;  Laterality: N/A;  Posterior cervical fusion, cerival 1-2, cervical 2-3 with instrumentation, iliac crest autograft  . Right hip replacement    . Gastric roux-en-y      Prior to Admission medications   Medication Sig Start Date End Date Taking? Authorizing Provider  acetaminophen-codeine (TYLENOL #3) 300-30 MG per tablet Take 1-2 tablets by mouth 4 (four) times daily as needed (headache).  10/23/12  Yes Historical Provider, MD  allopurinol (ZYLOPRIM) 300 MG tablet Take 300 mg by mouth every morning.    Yes Historical Provider, MD  aspirin 81 MG tablet Take 81 mg by mouth daily.   Yes Historical Provider, MD  butalbital-acetaminophen-caffeine (FIORICET, ESGIC) 50-325-40 MG per tablet Take 1-2 tablets by mouth every 6 (six) hours as needed for headache (headache). 01/21/14  Yes Heath Lark, MD  Calcium Carbonate-Vitamin D (CALCIUM 600+D) 600-400 MG-UNIT per tablet Take 1 tablet by mouth daily.  Yes Historical Provider, MD  cholecalciferol (VITAMIN D) 1000 UNITS tablet Take 1,000 Units by mouth 2 (two) times daily.   Yes Historical Provider, MD  citalopram (CELEXA) 20 MG tablet Take 20 mg by mouth every morning.    Yes Historical Provider, MD  Cyanocobalamin (VITAMIN B-12 SL) Place 1 tablet under the tongue daily.   Yes Historical Provider, MD  fluticasone (FLONASE) 50 MCG/ACT nasal spray Place 3 sprays into the nose daily.   Yes Historical Provider, MD  folic acid (FOLVITE) 1 MG tablet Take 1 tablet (1 mg total) by mouth daily. 10/10/13  Yes Heath Lark, MD  furosemide (LASIX) 40 MG tablet Take 20 mg by mouth daily. 10/10/13  Yes Heath Lark, MD  levothyroxine (SYNTHROID, LEVOTHROID) 75 MCG tablet Take 75 mcg by mouth daily before breakfast.    Yes Historical Provider, MD  methotrexate (RHEUMATREX) 2.5 MG tablet Take 4 tablets (10 mg total) by mouth once a week. Caution:Chemotherapy. Protect from light. 01/21/14  Yes Heath Lark, MD  metoprolol tartrate  (LOPRESSOR) 25 MG tablet Take 25 mg by mouth 2 (two) times daily.    Yes Historical Provider, MD  Multiple Vitamin (MULTIVITAMIN WITH MINERALS) TABS Take 1 tablet by mouth daily. Centrum   Yes Historical Provider, MD  ondansetron (ZOFRAN) 8 MG tablet Take 8 mg by mouth every 8 (eight) hours as needed for nausea or vomiting (nausea).   Yes Historical Provider, MD  oxybutynin (DITROPAN) 5 MG tablet Take 5 mg by mouth Daily. 07/28/11  Yes Historical Provider, MD  Potassium 99 MG TABS Take 1 tablet by mouth daily.    Yes Historical Provider, MD  predniSONE (DELTASONE) 5 MG tablet Take 5 mg by mouth 2 (two) times daily with a meal.  09/10/13  Yes Heath Lark, MD    Current Facility-Administered Medications  Medication Dose Route Frequency Provider Last Rate Last Dose  . acetaminophen (TYLENOL) tablet 650 mg  650 mg Oral Q6H PRN Orson Eva, MD   650 mg at 02/23/14 2113  . allopurinol (ZYLOPRIM) tablet 300 mg  300 mg Oral q morning - 10a Thurnell Lose, MD   300 mg at 02/25/14 1051  . citalopram (CELEXA) tablet 20 mg  20 mg Oral q morning - 10a Thurnell Lose, MD   20 mg at 02/25/14 1051  . feeding supplement (RESOURCE BREEZE) (RESOURCE BREEZE) liquid 1 Container  1 Container Oral TID WC Elyse A Shearer, RD   1 Container at 02/25/14 1338  . folic acid (FOLVITE) tablet 1 mg  1 mg Oral Daily Thurnell Lose, MD   1 mg at 02/25/14 1051  . guaiFENesin-dextromethorphan (ROBITUSSIN DM) 100-10 MG/5ML syrup 5 mL  5 mL Oral Q4H PRN Thurnell Lose, MD   5 mL at 02/24/14 1415  . HYDROcodone-acetaminophen (NORCO/VICODIN) 5-325 MG per tablet 1-2 tablet  1-2 tablet Oral Q4H PRN Thurnell Lose, MD   1 tablet at 02/24/14 2036  . hydrocortisone sodium succinate (SOLU-CORTEF) 100 MG injection 50 mg  50 mg Intravenous Daily Orson Eva, MD   50 mg at 02/25/14 1051  . levothyroxine (SYNTHROID, LEVOTHROID) tablet 75 mcg  75 mcg Oral QAC breakfast Thurnell Lose, MD   75 mcg at 02/25/14 1051  . morphine 2 MG/ML injection  2 mg  2 mg Intravenous Q4H PRN Thurnell Lose, MD      . morphine 4 MG/ML injection 4 mg  4 mg Intravenous Once Mariea Clonts, MD      .  ondansetron (ZOFRAN) injection 4 mg  4 mg Intravenous Q6H PRN Thurnell Lose, MD   4 mg at 02/23/14 1232  . oxybutynin (DITROPAN) tablet 5 mg  5 mg Oral BID Thurnell Lose, MD   5 mg at 02/25/14 1051  . piperacillin-tazobactam (ZOSYN) IVPB 3.375 g  3.375 g Intravenous 3 times per day Dara Hoyer, RPH   3.375 g at 02/25/14 1338  . polyethylene glycol (MIRALAX / GLYCOLAX) packet 17 g  17 g Oral Daily PRN Thurnell Lose, MD      . sodium chloride 0.9 % injection 3 mL  3 mL Intravenous Q12H Thurnell Lose, MD   3 mL at 02/24/14 2036    Allergies as of 02/22/2014 - Review Complete 02/22/2014  Allergen Reaction Noted  . Latex Itching and Rash 09/06/2011  . Penicillins Rash   . Sulfonamide derivatives Rash     Family History  Problem Relation Age of Onset  . Coronary artery disease Neg Hx   . Cancer Mother     lung cancer  . Liver disease Father   . Anemia Sister     History   Social History  . Marital Status: Married    Spouse Name: N/A    Number of Children: N/A  . Years of Education: N/A   Occupational History  . Not on file.   Social History Main Topics  . Smoking status: Former Smoker -- 0.25 packs/day for 10 years    Quit date: 05/24/1968  . Smokeless tobacco: Never Used     Comment: no smoking  . Alcohol Use: No  . Drug Use: No  . Sexual Activity: No   Other Topics Concern  . Not on file   Social History Narrative   Lives with husband.     Review of Systems: Ten point ROS is O/W negative except as mentioned in HPI.  Physical Exam: Vital signs in last 24 hours: Temp:  [97.6 F (36.4 C)-98.1 F (36.7 C)] 98.1 F (36.7 C) (10/05 1506) Pulse Rate:  [60-76] 63 (10/05 1506) Resp:  [18-20] 18 (10/05 1506) BP: (88-102)/(47-54) 88/47 mmHg (10/05 1506) SpO2:  [97 %-100 %] 100 % (10/05 1506) Weight:  [208 lb 4.8  oz (94.484 kg)] 208 lb 4.8 oz (94.484 kg) (10/05 0500) Last BM Date: 02/24/14 General:  Alert, Well-developed, well-nourished, pleasant and cooperative in NAD Head:  Normocephalic and atraumatic. Eyes:  Sclera clear, no icterus.  Conjunctiva pink. Ears:  Normal auditory acuity. Mouth:  No deformity or lesions.   Lungs:  Clear throughout to auscultation.  No wheezes, crackles, or rhonchi.  Heart:  Regular rate and rhythm; murmur noted. Abdomen:  Soft, non-distended.  BS present.  Periumbilical incision hernia noted.  Upper abdominal TTP in epigastrium, RUQ, and LUQ.     Rectal:  Deferred  Msk:  Symmetrical without gross deformities. Pulses:  Normal pulses noted. Extremities:  Significant edema noted in B/L LE's. Neurologic:  Alert and  oriented x4;  grossly normal neurologically.  Neck with limited mobility due to previous surgery and hardware. Skin:  Intact without significant lesions or rashes. Psych:  Alert and cooperative. Normal mood and affect.  Intake/Output from previous day: 10/04 0701 - 10/05 0700 In: 1110 [P.O.:960; IV Piggyback:150] Out: 1517 [Urine:1650]  Lab Results:  Recent Labs  02/23/14 0435 02/24/14 0500 02/25/14 0455  WBC 7.7 7.0 5.6  HGB 8.2* 8.4* 7.7*  HCT 25.6* 25.6* 23.3*  PLT 36* 39* 39*   BMET  Recent Labs  02/23/14 0435 02/24/14 0500 02/25/14 0455  NA 144 138 141  K 3.7 3.6* 3.8  CL 103 99 102  CO2 29 25 26   GLUCOSE 115* 134* 93  BUN 13 16 22   CREATININE 1.00 1.05 1.08  CALCIUM 9.0 8.9 8.6   LFT  Recent Labs  02/25/14 0455  PROT 5.3*  ALBUMIN 3.1*  AST 33  ALT 26  ALKPHOS 111  BILITOT 2.8*   Studies/Results: Ct Abdomen Pelvis W Contrast  02/25/2014   CLINICAL DATA:  Epigastric pain, right upper quadrant pain, diarrhea  EXAM: CT ABDOMEN AND PELVIS WITH CONTRAST  TECHNIQUE: Multidetector CT imaging of the abdomen and pelvis was performed using the standard protocol following bolus administration of intravenous contrast.   CONTRAST:  147mL OMNIPAQUE IOHEXOL 300 MG/ML  SOLN  COMPARISON:  None.  FINDINGS: The lung bases are clear.  The liver demonstrates no focal abnormality. There is no intrahepatic or extrahepatic biliary ductal dilatation. There are cholelithiasis with mild gallbladder wall thickening. The spleen demonstrates no focal abnormality. The kidneys, adrenal glands and pancreas are normal. The bladder is unremarkable.  There is bowel wall thickening involving the transverse and descending colon. 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 There is severe gastric wall thickening with a moderate amount of fluid in the excluded portion of the stomach. There is a small left paraumbilical hernia containing small bowel and a small amount of fluid. There is anasarca. There is no bowel obstruction. There is no pneumoperitoneum, pneumatosis, or portal venous gas. There is no abdominal or pelvic free fluid. There is no lymphadenopathy.  The abdominal aorta is normal in caliber with atherosclerosis.  There are no lytic or sclerotic osseous lesions. There is diffuse lumbar spine spondylosis. 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  IMPRESSION: 1. Mild colonic wall thickening involving the transverse and descending colon most concerning for colitis secondary to infectious or inflammatory. 2. Severe gastric wall thickening with a moderate amount of fluid in the excluded portion of the stomach. This may reflect severe gastritis versus less likely gastric malignancy. Prior gastric bypass is noted without obstruction. 3. Cholelithiasis with mild gallbladder wall thickening. These findings can be seen with cholecystitis, but can also be reactive secondary to adjacent inflammatory process given the abnormality involving the stomach and colon. If there is concern regarding cholecystitis, further evaluation with HIDA scan is recommended.   Electronically Signed   By: Kathreen Devoid   On: 02/25/2014  11:15    IMPRESSION:  -73 year old female with upper abdominal pain, nausea, dry heaves, elevated LFT's, and imaging studies suggesting chronic cholecystitis.  CT scan also showing gastric wall thickening. -Diarrhea:  Cdiff negative.  CT scan also showing some colonic wall thickening.  Last colonoscopy 08/2010. -Leukemia:  With anemia (Hgb 7.7 grams) and thrombocytopenia (platelets 39). -Multiple other medical problems -S/p Roux-en-Y gastric bypass surgery  PLAN: -Will attempt EGD, 10/6, to further evaluate the gastric abnormalities on CT scan.  Rule out malignancy, ulcer disease, gastritis, etc.  Will give one unit of platelets tomorrow AM. -EGD findings still will not explain elevated LFT's and abnormal gallbladder imaging so the chronic cholecystitis issue will still need to be addressed per surgery.   ZEHR, JESSICA D.  02/25/2014, 3:11 PM  Pager number [phone]      Attending physician's note   I have taken a history, examined the patient and reviewed the chart. I agree with the Advanced Practitioner's note, impression and  recommendations. Upper abd pain, N/V. Prior Roux-en-Y gastric bypass 10 years ago in [place]. CT showed gastric wall thickening, fluid in excluded gastric segment, mild colonic wall thickening and cholelithiasis. HIDA showed delayed GB filling c/w chronic cholecystitis. Mildly elevated LFTs however only t bili is mildly elevated today. I am concerned that she has chronic cholecystitis-mgmt per General Surgery. Attempt EGD tomorrow after platelet transfusion. Intubation at EGD could be impeded by cervical spine fusion. Colonic wall thickening is mild and likely nonspecific. Had normal colonoscopy at [facility] in 2012. No plans to repeat her colonoscopy at this time.   Ladene Artist, MD [facility]

## 2014-02-25 NOTE — Progress Notes (Signed)
ANTIBIOTIC CONSULT NOTE - FOLLOW-UP  Pharmacy Consult for Zosyn Indication: intra-abdominal infection  Assessment: 89 yoF with history of large granular lymphocytic leukemia on weekly methotrexate and BID prednisone, admitted with cholecystitis.  She has mild leukocytosis and ultrasound confirmed gallstone disease.  Pharmacy consulted to dose Zosyn.  Noted allergy of rash to penicillins.  Continuing with order for Zosyn since patient appears to have tolerated other beta lactams during previous encounters.  Spoke with RN to monitor for rash.  Per surgery, plans are conservative mgmt with abx for now.  10/2 >> Zosyn >>   Tmax: afebrile since admission WBC: now returned to [facility] Renal: SCr stable at 1.1; CrCl 54 ml/min normalized  10/2 blood: NGTD 2/2 10/5 Cdiff PCR: ordered with diarrhea  Goal of Therapy:  Doses adjusted per renal function Eradication of infection  Plan:  Continue Zosyn 3.375g IV q8h (4 hour infusion time) Follow cultures, clinical course, and SCr    Allergies  Allergen Reactions  . Latex Itching and Rash  . Penicillins Rash  . Sulfonamide Derivatives Rash    Patient Measurements: Height: 5\' 2"  (157.5 cm) Weight: 208 lb 4.8 oz (94.484 kg) IBW/kg (Calculated) : 50.1  Vital Signs: Temp: 97.8 F (36.6 C) (10/05 0622) Temp Source: Oral (10/05 0622) BP: 102/51 mmHg (10/05 0622) Pulse Rate: 76 (10/05 0622) Intake/Output from previous day: 10/04 0701 - 10/05 0700 In: 1110 [P.O.:960; IV Piggyback:150] Out: 1650 [Urine:1650] Intake/Output from this shift:    Labs:  Recent Labs  02/23/14 0435 02/24/14 0500 02/25/14 0455  WBC 7.7 7.0 5.6  HGB 8.2* 8.4* 7.7*  PLT 36* 39* 39*  CREATININE 1.00 1.05 1.08   Estimated Creatinine Clearance: 51.2 ml/min (by C-G formula based on Cr of 1.08). No results found for this basename: VANCOTROUGH, Corlis Leak, VANCORANDOM, [place], [place], [place], [place], [place], TOBRARND, AMIKACINPEAK, AMIKACINTROU,  AMIKACIN,  in the last 72 hours   Microbiology: Recent Results (from the past 720 hour(s))  TECHNOLOGIST REVIEW     Status: None   Collection Time    02/12/14  8:04 AM      Result Value Ref Range Status   Technologist Review Variant lymphs present   Final  TECHNOLOGIST REVIEW     Status: None   Collection Time    02/19/14  8:44 AM      Result Value Ref Range Status   Technologist Review Variant lymphs present   Final  TECHNOLOGIST REVIEW     Status: None   Collection Time    02/22/14 10:15 AM      Result Value Ref Range Status   Technologist Review Variant lymphs present, rare myelocyte and nRBC    Final  CULTURE, BLOOD (ROUTINE X 2)     Status: None   Collection Time    02/22/14  4:03 PM      Result Value Ref Range Status   Specimen Description BLOOD RIGHT ANTECUBITAL   Final   Special Requests BOTTLES DRAWN AEROBIC AND ANAEROBIC 10 CC   Final   Culture  Setup Time     Final   Value: 02/22/2014 22:16     Performed at [facility]   Culture     Final   Value:        BLOOD CULTURE RECEIVED NO GROWTH TO DATE CULTURE WILL BE HELD FOR 5 DAYS BEFORE ISSUING A FINAL NEGATIVE REPORT     Performed at [facility]   Report Status PENDING   Incomplete  CULTURE, BLOOD (ROUTINE X 2)  Status: None   Collection Time    02/22/14  4:08 PM      Result Value Ref Range Status   Specimen Description BLOOD RIGHT ANTECUBITAL   Final   Special Requests BOTTLES DRAWN AEROBIC AND ANAEROBIC 10 CC   Final   Culture  Setup Time     Final   Value: 02/22/2014 22:15     Performed at [facility]   Culture     Final   Value:        BLOOD CULTURE RECEIVED NO GROWTH TO DATE CULTURE WILL BE HELD FOR 5 DAYS BEFORE ISSUING A FINAL NEGATIVE REPORT     Performed at [facility]   Report Status PENDING   Incomplete    Medical History: Past Medical History  Diagnosis Date  . HTN (hypertension)   . Morbid obesity   . Rheumatic fever     child  . Dyslipidemia   .  Swelling of joint of lower leg     recurrent lower extremity leg swelling, noted by pt  . Anemia   . Hypothyroid   . Varicose vein of leg   . MRSA (methicillin resistant staph aureus) culture positive   . TACHYCARDIA 10/17/2009    Qualifier: Diagnosis of  By: Rockey Situ MD, Tim    . Anxiety   . Cough   . Heart murmur   . Renal insufficiency 09/06/2011    DR. HA  [facility]   . Uterine cancer 1996    s/p hysterectomy; patient reported low stage; did not need adjuvant chemo  or radiation.   . DDD (degenerative disc disease), cervical   . 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 Lymphocytosis 03/12/2013  . Other pancytopenia 03/12/2013  . Weight loss 03/12/2013  . 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 Bilateral leg edema 04/09/2013  . Nausea alone 04/09/2013  . Creatinine elevation 04/16/2013  . Renal failure, acute 05/11/2013  . Sinus congestion 07/09/2013  . Headache 07/17/2013  . Abnormal liver function tests 02/19/2014  . Cholecystitis 02/21/2014    Reuel Boom, PharmD Pager: [phone] 02/25/2014, 9:58 AM

## 2014-02-25 NOTE — Progress Notes (Signed)
General Surgery Note  LOS: 3 days  POD -     Assessment/Plan: 1.  Gall bladder disease/cholelithiasis  Gall stones known since 03/16/2013 on CT scan  Though HIDA scan on 02/23/2014 visualized GB  On Zosyn  Note:  Her T Bili has been elevated some since 12/04/2013, her Alk Phos elevated since 90/22/2015.  This is a very high risk surgical patient and I am not sure the gall bladder is the source of her problems.  She is for a CT scan later today, which may clarify things.  An option would be a perc drainage of her gall bladder, if it was thought she needed her gall bladder drained.  2.  Abdominal wall hernia  Present on CT scan on 03/16/2014  This is unchanged on PE.  Not obstructed.  3.  Large granular lymphocytic leukemia  Ni Gorsuch oncologist  She as diagnosed in January - but has struggle with her treatment. 4.  Anemia - Hgb - 7.7 - 02/25/2014  Thought to be secondary to bone marrow infiltration and component of anemia of chronic disease 5.  Thrombocytopenia - 39,000 - 02/25/2014 6.  On chronic steroids  7.  DVT prophylaxis - on hold with thrombocytopenia 8.  Morbid obesity  She has had at least two operations for weight loss  The last was in 2001 - vertical banded gastroplasty 9.  On isolation for diarrhea  C.Diff pending   Principal Problem:   Cholecystitis Active Problems:   Transaminitis   Obesity   Large granular lymphocytic leukemia   Anemia in neoplastic disease   Abnormal liver function tests   Acute cholecystitis   Chronic cholecystitis   Subjective:  Sore upper abdomen, but she does not feel that bad.  Her husband is in the room.  I spent aboutt 20 minutes going over the options.  The husband had laparoscopic gall bladder surgery about 15 years ago. Objective:   Filed Vitals:   02/25/14 0622  BP: 102/51  Pulse: 76  Temp: 97.8 F (36.6 C)  Resp: 20     Intake/Output from previous day:  10/04 0701 - 10/05 0700 In: 96 [P.O.:960; IV  Piggyback:150] Out: 1610 [Urine:1650]  Intake/Output this shift:      Physical Exam:   General: obese WF who is alert and oriented.    HEENT: Normal. Pupils equal. .   Lungs: Clear   Abdomen: Obese.  She is sore in her epigastrium.  She has some lateralized pain to the right upper quadrant.  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   Lab Results:    Recent Labs  02/24/14 0500 02/25/14 0455  WBC 7.0 5.6  HGB 8.4* 7.7*  HCT 25.6* 23.3*  PLT 39* 39*    BMET   Recent Labs  02/24/14 0500 02/25/14 0455  NA 138 141  K 3.6* 3.8  CL 99 102  CO2 25 26  GLUCOSE 134* 93  BUN 16 22  CREATININE 1.05 1.08  CALCIUM 8.9 8.6    PT/INR  No results found for this basename: LABPROT, INR,  in the last 72 hours  ABG  No results found for this basename: PHART, PCO2, PO2, HCO3,  in the last 72 hours   Studies/Results:  No results found.   Anti-infectives:   Anti-infectives   Start     Dose/Rate Route Frequency Ordered Stop   02/22/14 2200  piperacillin-tazobactam (ZOSYN) IVPB 3.375 g     3.375 g 12.5 mL/hr over 240 Minutes Intravenous  3 times per day 02/22/14 1549     02/22/14 1600  piperacillin-tazobactam (ZOSYN) IVPB 3.375 g     3.375 g 100 mL/hr over 30 Minutes Intravenous STAT 02/22/14 1547 02/22/14 1736      Alphonsa Overall, MD, FACS Pager: [facility] Office: [phone] 02/25/2014

## 2014-02-25 NOTE — Progress Notes (Signed)
Stacey Cooley   DOB:10-22-1940   JJ#:[id number]   HBZ#:[id number]  Patient Care Team: Madelyn Brunner, MD as PCP - General (Unknown Physician Specialty) Heath Lark, MD as Consulting Physician (Hematology and Oncology) Rollene Rotunda, MD as Consulting Physician (Hematology and Oncology)  I have seen the patient, examined her and edited the notes as follows  HPI: Stacey Cooley presented to the Emergency Department  with intermittent abdominal pain and outpatient ultrasound showing cholelithiasis and cholecystitis. She also had some nausea but no vomiting. She had no diarrhea on admission, but change in color, with darker stools. Appetite was decreased. No recent fevers or chills. She had very poor energy. In addition, she noted increased lower extremity edema. She received a unit of PRBCs for a H/H of 8/25.4. Her platelet count was 36K and her white count was 13.5K. An abdominal ultrasound on 10/1 showed cholelithiasis, gallbladder wall thickening and exam was consistent with positive Murphy's sign. LFT's were elevated and her total bilirubin was 3.4. Due to her history of large granular lymphocytic leukemia, case was discussed on admission. She was recommended to continue Prednisone. Methotrexate was placed on hold.  Surgery was consulted for possible cholecystectomy. HIDA performed on 10/3 was negative for acute cholecystitis but suggested chronic cholecystitis. At this time, general surgery recommends conservative treatment with antibiotics given her multiple comorbidities. CT of the abdomen and pelvis to be performed today as patient continues to have abdominal pain, now complicated with diarrhea.   Subjective: Patient has been seen and examined this morning. Abdominal pain continues to be present, complicated with loose stools, sent for C diff culture, results pending. Denies worsening shortness of breath or chest pain. Alert ad conversant. No other issues are reported. Denies acute bleeding problems.  She is for CT of the abdomen and pelvis today due to symptoms.   BRIEF SUMMARY OF ONCOLOGIC HISTORY:  She was found to have anemia and was placed on observation. The cause of the anemia was thought to be related to chronic kidney disease. The patient also has history of Roux-en-Y gastric bypass and it was thought to be the contributing factor. 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   On 04/09/2013, she was started on prednisone 60 mg daily and methotrexate 7.5 mg once a week by mouth  On 01/21/14: Prednisone was discontinued. She was tapered off methotrexate.  On 02/10/2014, methotrexate was reinitiated at 10 mg per week along with 10 mg prednisone.  On 02/19/2014, methotrexate was placed on hold due to elevated liver function tests.  On 02/21/2014, ultrasound abdomen showed cholecystitis.  Scheduled Meds: . allopurinol  300 mg Oral q morning - 10a  . citalopram  20 mg Oral q morning - 10a  . feeding supplement (RESOURCE BREEZE)  1 Container Oral TID WC  . folic acid  1 mg Oral Daily  . hydrocortisone sod succinate (SOLU-CORTEF) inj  50 mg Intravenous Daily  . iohexol  25 mL Oral Q1 Hr x 2  . levothyroxine  75 mcg Oral QAC breakfast  .  morphine injection  4 mg Intravenous Once  . oxybutynin  5 mg Oral BID  . piperacillin-tazobactam (ZOSYN)  IV  3.375 g Intravenous 3 times per day  . sodium chloride  3 mL Intravenous Q12H   Continuous Infusions:  PRN Meds:acetaminophen, guaiFENesin-dextromethorphan, HYDROcodone-acetaminophen, morphine injection, ondansetron (ZOFRAN) IV, polyethylene glycol   Objective:  Filed Vitals:   02/25/14 0622  BP: 102/51  Pulse: 76  Temp: 97.8 F (36.6 C)  Resp: 20      Intake/Output Summary (Last 24 hours) at 02/25/14 0815 Last data filed at 02/25/14 0630  Gross per 24 hour  Intake   1110 ml  Output   1650 ml  Net   -540 ml    ECOG PERFORMANCE STATUS: 2-3    GENERAL: alert, no distress and comfortable. She is obese and less jaundiced  SKIN: skin color is pale and slightly jaundiced, texture, turgor are normal, no rashes or significant lesions  EYES: normal, Conjunctiva are pale and slightly jaundiced and non-injected  OROPHARYNX:no exudate, no erythema and lips, buccal mucosa, and tongue normal  NECK: supple, thyroid normal size, non-tender, without nodularity  LYMPH: no palpable lymphadenopathy in the cervical, axillary or inguinal  LUNGS: clear to auscultation and percussion with normal breathing effort  HEART: regular rate & rhythm and no murmurs and 1+ bilateral lower extremity edema  ABDOMEN:abdomen soft, tender at the right upper quadrant and periumbilical area.Normal bowel sounds.   Musculoskeletal:no cyanosis of digits and no clubbing  NEURO: alert & oriented x 3 with fluent speech, no focal motor/sensory deficits    CBG (last 3)  No results found for this basename: GLUCAP,  in the last 72 hours   Labs:   Recent Labs Lab 02/19/14 0844 02/22/14 1015 02/22/14 1402 02/23/14 0435 02/24/14 0500 02/25/14 0455  WBC 7.6 11.4* 13.5* 7.7 7.0 5.6  HGB 8.5* 8.8* 8.9* 8.2* 8.4* 7.7*  HCT 27.0* 28.2* 27.4* 25.6* 25.6* 23.3*  PLT 27* 51* 36* 36* 39* 39*  MCV 91.8 92.7 90.7 91.4 90.1 90.3  MCH 28.9 29.0 29.5 29.3 29.6 29.8  MCHC 31.5 31.2* 32.5 32.0 32.8 33.0  RDW 20.4* 22.0* 20.3* 20.4* 20.2* 20.3*  LYMPHSABS 5.4* 9.4* 10.8*  --   --   --   MONOABS 1.0* 0.3 0.9  --   --   --   EOSABS 0.0 0.0 0.0  --   --   --   BASOSABS 0.0 0.1 0.0  --   --   --      Chemistries:    Recent Labs Lab 02/19/14 0844 02/22/14 1015 02/22/14 1402 02/23/14 0435 02/24/14 0500 02/25/14 0455  NA 139 141 139 144 138 141  K 4.1 3.5 3.6* 3.7 3.6* 3.8  CL  --   --  98 103 99 102  CO2 30* 29 27 29 25 26   GLUCOSE 99 110 112* 115* 134* 93  BUN 18.7 12.7 13 13 16 22   CREATININE 1.1 1.0 0.98 1.00 1.05 1.08  CALCIUM 9.4 9.4 9.3 9.0 8.9 8.6  AST 71*  65* 66*  --  43* 33  ALT 37 39 37*  --  30 26  ALKPHOS 155* 162* 156*  --  128* 111  BILITOT 3.34* 3.43* 3.4*  --  2.9* 2.8*    GFR Estimated Creatinine Clearance: 51.2 ml/min (by C-G formula based on Cr of 1.08).  Liver Function Tests:  Recent Labs Lab 02/19/14 0844 02/22/14 1015 02/22/14 1402 02/24/14 0500 02/25/14 0455  AST 71* 65* 66* 43* 33  ALT 37 39 37* 30 26  ALKPHOS 155* 162* 156* 128* 111  BILITOT 3.34* 3.43* 3.4* 2.9* 2.8*  PROT 5.3* 5.4* 5.7* 5.5* 5.3*  ALBUMIN 3.2* 3.2* 3.4* 3.2* 3.1*    Recent Labs Lab 02/22/14 1402  LIPASE 21       Imaging Studies: I reviewed the CT scan myself and agreed with the interpretation. Assessment/Plan: 73 y.o.  Large  granular lymphocytic leukemia  On admission, she was on 10 mg of prednisone daily..  Methotrexate was placed on hold this week due to elevated liver function tests.  Prednisone to continue, but switched to IV on 10/4 due to low blood pressure  Anemia in neoplastic disease  The cause of the anemia is due to bone marrow infiltration and component of anemia of chronic disease. Infection is being ruled out, cultures pending. She received 1 unit of blood on admission as Hb was 8 and she was symptomatic.  No bleeding issues reported, but Hb dropped again today to 7.7 Would transfuse her with blood if hemoglobin dropped to less than 7 g or if she is symptomatic. She needs irradiated blood products.   Thrombocytopenia due to drugs  Suspect this is due to bone marrow infiltration from LGL and recent methotrexate.  She does not need transfusions of platelets now, as no bleeding issues are reported.  Will defer platelet transfusion to General surgery based on their comfort level, particularly if surgery is needed   Cholecystitis  She had mild leukocytosis and ultrasound confirmed gallstone disease.  The total bilirubin level was getting worse. She was admitted via Emergency Department to the Hospitalist team for  further evaluation She was placed on antibiotics HIDA was negative for acute cholecystitis but suggests chronic cholecystitis  CT abdomen pelvis to be performed today for further evaluation. Continue Zosyn IV.  Diarrhea This is a new symptom, Cdiff colitis is negative She is on enteric precautions as sample being studied.   Chronic diastolic dysfunction  On Lasix per primary team 02/23/2014 echo shows EF 24-58%, grade 1 diastolic dysfunction and no wall motion abnormalities.  Full Code Other medical issues as per admitting team   Gastric wall thickening with abnormalities in her colon on CT scan I recommend GI consult.  Discharge planning She has a very complicated case. I will review her case at the hematology tumor board tomorrow and will discuss further with the multidisciplinary clinic team for further management.  **Disclaimer: This note was dictated with voice recognition software. Similar sounding words can inadvertently be transcribed and this note may contain transcription errors which may not have been corrected upon publication of note.Sharene Butters E, PA-C 02/25/2014  8:15 AM Shenicka Sunderlin, MD 02/25/2014

## 2014-02-25 NOTE — Progress Notes (Signed)
PROGRESS NOTE  Stacey Cooley GQB:[id number] DOB: [birth year] DOA: 02/22/2014 PCP: Lottie Mussel, MD  Brief history  73 year old female with a history of lymphocytic leukemia, hypertension, hypothyroidism, chronic lower extremity edema presents with 2-3 weeks of worsening epigastric and right upper quadrant pain associated with intermittent dry heaving that occurs with meals. She also stated that the pain somewhat worsened with eating. In addition, the patient was noted to have increased LFTs and increasing bilirubin over the last few weeks. The patient's oncologist, Dr. Alvy Bimler, ordered an abdominal ultrasound which was suggestive of possible cholecystitis. As a result the patient was admitted, and general surgery was consulted. HIDA scan has been performed, and it was negative for acute cholecystitis, but suggested chronic cholecystitis. At this time, general surgery feels that the patient should be treated conservatively with antibiotics given her multiple comorbidities. CT of the abdomen and pelvis has been ordered as pt continues to have abdominal pain. CT abdomen and pelvis revealed bowel wall thickening of the transverse and ascending colon with severe gastric wall thickening. 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 Alvy Bimler  -continue IV stress steroids for today but decrease dose -transfuse platelets  and packed RBCs as desired. Will defer platelet transfusion to Gen. surgery based on their comfort level, particularly if surgery is needed  -presently no signs of active bleed  -hold Methotrexate  -continue stress steroids  -Transfuse PRBC for hemoglobin less than 7 Diarrhea  -C.diff PCR--neg  Hypertension  -d/c metoprolol tartrate as BP is soft  Chronic lower extremity edema/diastolic dysfunction  -presently denies any dyspnea, lungs are clear  -02/23/2001 echo EF 82-80%, grade 1 diastolic dysfunction, no WMA  Hypothyroidism  -Continue Synthroid  Gouty Arthritis  -Continue allopurinol  Family Communication: Husband updated at beside  Disposition Plan: Home when cleared by surgery and GI Antibiotics:  Zosyn 02/22/14>>>         Procedures/Studies: Dg Chest 2 View  02/22/2014   CLINICAL DATA:  Preoperative exam prior to cholecystectomy; cough  EXAM: CHEST  2 VIEW  COMPARISON:  PA and lateral chest x-ray of August 02, 2012  FINDINGS: The lungs are adequately inflated. The interstitial markings are mildly increased bilaterally. The cardiopericardial silhouette is enlarged and the central pulmonary vascularity is engorged. There is tortuosity of the descending thoracic aorta. The bony thorax exhibits no acute abnormalities. The patient has undergone previous cervical and upper thoracic fusion.  IMPRESSION: There is low grade CHF with mild interstitial edema. There is no pleural effusion nor evidence of pneumonia.   Electronically Signed   By: Shelanda Duvall  [place]   On: 02/22/2014 15:21   Nm Hepatobiliary  02/23/2014   CLINICAL DATA:  Abnormal liver function studies. Abdominal pain in the right upper quadrant with nausea for 3 weeks.  EXAM: NUCLEAR MEDICINE HEPATOBILIARY IMAGING  TECHNIQUE: Sequential images of the abdomen were obtained out to 60 minutes following intravenous administration of radiopharmaceutical.  RADIOPHARMACEUTICALS:  5.5 Millicurie [id number]  Choletec  COMPARISON:  Ultrasound  abdomen 02/21/2014.  FINDINGS: There is normal homogeneous uptake and distribution of activity throughout the liver with normal washout and prompt appearance of activity in the intrahepatic and extrahepatic bile ducts and small bowel. There is delayed appearance of activity in the gallbladder, seen only at 3-4 hr after injection. Findings are compatible with chronic cholecystitis. Acute cholecystitis can sometimes demonstrate delayed uptake as well. No evidence of biliary obstruction.  IMPRESSION: Delayed appearance of tracer activity in the gallbladder most consistent with chronic cholecystitis. No evidence of biliary obstruction.   Electronically Signed   By: Lucienne Capers M.D.   On: 02/23/2014 04:30   Ct Abdomen Pelvis W Contrast  02/25/2014   CLINICAL DATA:  Epigastric pain, right upper quadrant pain, diarrhea  EXAM: CT ABDOMEN AND PELVIS WITH CONTRAST  TECHNIQUE: Multidetector CT imaging of the abdomen and pelvis was performed using the standard protocol following bolus administration of intravenous contrast.  CONTRAST:  167mL OMNIPAQUE IOHEXOL 300 MG/ML  SOLN  COMPARISON:  None.  FINDINGS: The lung bases are clear.  The liver demonstrates no focal abnormality. There is no intrahepatic or extrahepatic biliary ductal dilatation. There are cholelithiasis with mild gallbladder wall thickening. The spleen demonstrates no focal abnormality. The kidneys, adrenal glands and pancreas are normal. The bladder is unremarkable.  There is bowel wall thickening involving the transverse and descending colon. 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 There is severe gastric wall thickening with a moderate amount of fluid in the excluded portion of the stomach. There is a small left paraumbilical hernia containing small bowel and a small amount of fluid. There is anasarca. There is no bowel obstruction. There is no pneumoperitoneum, pneumatosis, or portal venous gas. There is no abdominal or pelvic free fluid. There is  no lymphadenopathy.  The abdominal aorta is normal in caliber with atherosclerosis.  There are no lytic or sclerotic osseous lesions. There is diffuse lumbar spine spondylosis. 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  IMPRESSION: 1. Mild colonic wall thickening involving the transverse and descending colon most concerning for colitis secondary to infectious or inflammatory. 2. Severe gastric wall thickening with a moderate amount of fluid in the excluded portion of the stomach. This may reflect severe gastritis versus less likely gastric malignancy. Prior gastric bypass is noted without obstruction. 3. Cholelithiasis with mild gallbladder wall thickening. These findings can be seen with cholecystitis, but can also be reactive secondary to adjacent inflammatory process given the abnormality involving the stomach and colon. If there is concern regarding cholecystitis, further evaluation with HIDA scan is recommended.   Electronically Signed   By: Kathreen Devoid   On: 02/25/2014 11:15   US Abdomen Limited  02/21/2014   CLINICAL DATA:  Transaminitis. History of laparoscopic band surgery.  EXAM: US ABDOMEN LIMITED - RIGHT UPPER QUADRANT  COMPARISON:  Abdominal pelvic CT 03/12/2013.  FINDINGS: Gallbladder:  Gallstones are noted within the gallbladder neck. There is moderate gallbladder wall thickening to 7 mm. There is a small amount of pericholecystic fluid, and fluid is also present within Morison's pouch. Sonographic Percell Miller sign is positive according to the sonographer.  Common bile duct:  Diameter: 5 mm.  No evidence of choledocholithiasis.  Liver:  No focal lesion identified. Within normal limits in parenchymal echogenicity.  IMPRESSION: 1. Cholelithiasis, gallbladder wall thickening and positive sonographic Murphy sign worrisome for acute cholecystitis. 2. No biliary dilatation. 3. These results will be called to the  ordering clinician or representative by the  Radiologist Assistant, and communication documented in the PACS or zVision Dashboard.   Electronically Signed   By: Camie Patience M.D.   On: 02/21/2014 09:28         Subjective:  patient continues to the plan of epigastric abdominal pain although is somewhat more improved than the past 2 days. She denies any vomiting, chest pain, shortness breath, fevers, chills. She has some loose stools without hematochezia.  Objective: Filed Vitals:   02/24/14 2102 02/25/14 0500 02/25/14 0622 02/25/14 1506  BP: 94/54  102/51 88/47  Pulse: 60  76 63  Temp: 97.6 F (36.4 C)  97.8 F (36.6 C) 98.1 F (36.7 C)  TempSrc: Oral  Oral Oral  Resp: 20  20 18   Height:      Weight:  94.484 kg (208 lb 4.8 oz)    SpO2: 97%  98% 100%    Intake/Output Summary (Last 24 hours) at 02/25/14 1740 Last data filed at 02/25/14 1724  Gross per 24 hour  Intake    760 ml  Output    750 ml  Net     10 ml   Weight change: 0.227 kg (8 oz) Exam:   General:  Pt is alert, follows commands appropriately, not in acute distress  HEENT: No icterus, No thrush, [place]/AT  Cardiovascular: RRR, S1/S2, no rubs, no gallops  Respiratory: CTA bilaterally, no wheezing, no crackles, no rhonchi  Abdomen: Soft/+BS, epigastric and periumbilical tenderness without any peritoneal signs, non distended, no guarding  Extremities: 2+ LE edema, No lymphangitis, No petechiae, No rashes, no synovitis  Data Reviewed: Basic Metabolic Panel:  Recent Labs Lab 02/22/14 1015 02/22/14 1402 02/23/14 0435 02/24/14 0500 02/25/14 0455  NA 141 139 144 138 141  K 3.5 3.6* 3.7 3.6* 3.8  CL  --  98 103 99 102  CO2 29 27 29 25 26   GLUCOSE 110 112* 115* 134* 93  BUN 12.7 13 13 16 22   CREATININE 1.0 0.98 1.00 1.05 1.08  CALCIUM 9.4 9.3 9.0 8.9 8.6   Liver Function Tests:  Recent Labs Lab 02/19/14 0844 02/22/14 1015 02/22/14 1402 02/24/14 0500 02/25/14 0455  AST 71* 65* 66* 43* 33  ALT 37 39 37* 30 26  ALKPHOS 155* 162* 156* 128*  111  BILITOT 3.34* 3.43* 3.4* 2.9* 2.8*  PROT 5.3* 5.4* 5.7* 5.5* 5.3*  ALBUMIN 3.2* 3.2* 3.4* 3.2* 3.1*    Recent Labs Lab 02/22/14 1402  LIPASE 21   No results found for this basename: AMMONIA,  in the last 168 hours CBC:  Recent Labs Lab 02/19/14 0844 02/22/14 1015 02/22/14 1402 02/23/14 0435 02/24/14 0500 02/25/14 0455  WBC 7.6 11.4* 13.5* 7.7 7.0 5.6  NEUTROABS 1.1* 1.6 1.8  --   --   --   HGB 8.5* 8.8* 8.9* 8.2* 8.4* 7.7*  HCT 27.0* 28.2* 27.4* 25.6* 25.6* 23.3*  MCV 91.8 92.7 90.7 91.4 90.1 90.3  PLT 27* 51* 36* 36* 39* 39*   Cardiac Enzymes: No results found for this basename: CKTOTAL, CKMB, CKMBINDEX, TROPONINI,  in the last 168 hours BNP: No components found with this basename: POCBNP,  CBG: No results found for this basename: GLUCAP,  in the last 168 hours  Recent Results (from the past 240 hour(s))  TECHNOLOGIST REVIEW     Status: None   Collection Time    02/19/14  8:44 AM      Result Value Ref Range Status   Technologist Review Variant lymphs  present   Final  TECHNOLOGIST REVIEW     Status: None   Collection Time    02/22/14 10:15 AM      Result Value Ref Range Status   Technologist Review Variant lymphs present, rare myelocyte and nRBC    Final  CULTURE, BLOOD (ROUTINE X 2)     Status: None   Collection Time    02/22/14  4:03 PM      Result Value Ref Range Status   Specimen Description BLOOD RIGHT ANTECUBITAL   Final   Special Requests BOTTLES DRAWN AEROBIC AND ANAEROBIC 10 CC   Final   Culture  Setup Time     Final   Value: 02/22/2014 22:16     Performed at [facility]   Culture     Final   Value:        BLOOD CULTURE RECEIVED NO GROWTH TO DATE CULTURE WILL BE HELD FOR 5 DAYS BEFORE ISSUING A FINAL NEGATIVE REPORT     Performed at [facility]   Report Status PENDING   Incomplete  CULTURE, BLOOD (ROUTINE X 2)     Status: None   Collection Time    02/22/14  4:08 PM      Result Value Ref Range Status   Specimen Description  BLOOD RIGHT ANTECUBITAL   Final   Special Requests BOTTLES DRAWN AEROBIC AND ANAEROBIC 10 CC   Final   Culture  Setup Time     Final   Value: 02/22/2014 22:15     Performed at [facility]   Culture     Final   Value:        BLOOD CULTURE RECEIVED NO GROWTH TO DATE CULTURE WILL BE HELD FOR 5 DAYS BEFORE ISSUING A FINAL NEGATIVE REPORT     Performed at [facility]   Report Status PENDING   Incomplete  CLOSTRIDIUM DIFFICILE BY PCR     Status: None   Collection Time    02/25/14 10:15 AM      Result Value Ref Range Status   C difficile by pcr NEGATIVE  NEGATIVE Final   Comment: Performed at [facility]     Scheduled Meds: . sodium chloride   Intravenous Once  . allopurinol  300 mg Oral q morning - 10a  . citalopram  20 mg Oral q morning - 10a  . feeding supplement (RESOURCE BREEZE)  1 Container Oral TID WC  . folic acid  1 mg Oral Daily  . hydrocortisone sod succinate (SOLU-CORTEF) inj  50 mg Intravenous Daily  . levothyroxine  75 mcg Oral QAC breakfast  .  morphine injection  4 mg Intravenous Once  . oxybutynin  5 mg Oral BID  . piperacillin-tazobactam (ZOSYN)  IV  3.375 g Intravenous 3 times per day  . sodium chloride  3 mL Intravenous Q12H   Continuous Infusions:    Valeska Haislip, DO  [facility] Pager [phone]  If 7PM-7AM, please contact night-coverage [email or web address] Password [email or web address] 02/25/2014, 5:40 PM   LOS: 3 days

## 2014-02-26 ENCOUNTER — Other Ambulatory Visit: Payer: Medicare Other

## 2014-02-26 ENCOUNTER — Encounter (HOSPITAL_COMMUNITY)
Admission: EM | Disposition: A | Discharge status: Home / Self Care | Payer: Self-pay | Source: Home / Self Care | Attending: Internal Medicine

## 2014-02-26 ENCOUNTER — Encounter (HOSPITAL_COMMUNITY): Payer: Self-pay | Admitting: *Deleted

## 2014-02-26 DIAGNOSIS — D61818 Other pancytopenia: Secondary | ICD-10-CM

## 2014-02-26 DIAGNOSIS — R161 Splenomegaly, not elsewhere classified: Secondary | ICD-10-CM

## 2014-02-26 HISTORY — PX: ESOPHAGOGASTRODUODENOSCOPY: SHX5428

## 2014-02-26 LAB — TYPE AND SCREEN
ABO/RH(D): O NEG
ANTIBODY SCREEN: NEGATIVE
UNIT DIVISION: 0
Unit division: 0

## 2014-02-26 SURGERY — EGD (ESOPHAGOGASTRODUODENOSCOPY)
Anesthesia: Moderate Sedation

## 2014-02-26 MED ORDER — PEG-KCL-NACL-NASULF-NA ASC-C 100 G PO SOLR
0.5000 | Freq: Once | ORAL | Status: DC
  Filled 2014-02-26: qty 1

## 2014-02-26 MED ORDER — MIDAZOLAM HCL 10 MG/2ML IJ SOLN
INTRAMUSCULAR | Status: AC
  Filled 2014-02-26: qty 4

## 2014-02-26 MED ORDER — PEG-KCL-NACL-NASULF-NA ASC-C 100 G PO SOLR: 1.0000 | Freq: Once | ORAL | Status: DC

## 2014-02-26 MED ORDER — MIDAZOLAM HCL 10 MG/2ML IJ SOLN
INTRAMUSCULAR | Status: DC | PRN
  Administered 2014-02-26: 1 mg via INTRAVENOUS
  Administered 2014-02-26 (×2): 2 mg via INTRAVENOUS

## 2014-02-26 MED ORDER — FENTANYL CITRATE 0.05 MG/ML IJ SOLN
INTRAMUSCULAR | Status: AC
  Filled 2014-02-26: qty 4

## 2014-02-26 MED ORDER — FUROSEMIDE 20 MG PO TABS
20.0000 mg | ORAL_TABLET | Freq: Every day | ORAL | Status: DC
  Administered 2014-02-27: 20 mg via ORAL
  Filled 2014-02-26: qty 1

## 2014-02-26 MED ORDER — VITAMINS A & D EX OINT
TOPICAL_OINTMENT | CUTANEOUS | Status: AC
  Administered 2014-02-26: 5
  Filled 2014-02-26: qty 5

## 2014-02-26 MED ORDER — PEG-KCL-NACL-NASULF-NA ASC-C 100 G PO SOLR
0.5000 | Freq: Once | ORAL | Status: DC
Start: 2014-02-27 — End: 2014-02-26
  Filled 2014-02-26: qty 1

## 2014-02-26 MED ORDER — FENTANYL CITRATE 0.05 MG/ML IJ SOLN
INTRAMUSCULAR | Status: DC | PRN
  Administered 2014-02-26 (×2): 25 ug via INTRAVENOUS

## 2014-02-26 MED ORDER — BUTAMBEN-TETRACAINE-BENZOCAINE 2-2-14 % EX AERO
INHALATION_SPRAY | CUTANEOUS | Status: DC | PRN
  Administered 2014-02-26: 2 via TOPICAL

## 2014-02-26 MED ORDER — PANTOPRAZOLE SODIUM 40 MG PO TBEC
40.0000 mg | DELAYED_RELEASE_TABLET | Freq: Two times a day (BID) | ORAL | Status: DC
  Administered 2014-02-26 – 2014-02-28 (×5): 40 mg via ORAL
  Filled 2014-02-26 (×6): qty 1

## 2014-02-26 NOTE — Progress Notes (Signed)
Stacey Cooley   DOB:09/03/1940   EQ#:[id number]   [id number]  I have seen the patient, examined her and edited the notes as follows  Subjective: Patient has been seen and examined this morning. Abdominal pain improved. No further loose stools.  Denies worsening shortness of breath or chest pain. Alert ad conversant. No other issues are reported. Denies acute bleeding problems.  EGD was performed which showed gastric ulcer.   Scheduled Meds: . allopurinol  300 mg Oral q morning - 10a  . citalopram  20 mg Oral q morning - 10a  . feeding supplement (RESOURCE BREEZE)  1 Container Oral TID WC  . folic acid  1 mg Oral Daily  . hydrocortisone sod succinate (SOLU-CORTEF) inj  50 mg Intravenous Daily  . levothyroxine  75 mcg Oral QAC breakfast  .  morphine injection  4 mg Intravenous Once  . oxybutynin  5 mg Oral BID  . piperacillin-tazobactam (ZOSYN)  IV  3.375 g Intravenous 3 times per day  . sodium chloride  3 mL Intravenous Q12H   Continuous Infusions:  PRN Meds:acetaminophen, guaiFENesin-dextromethorphan, HYDROcodone-acetaminophen, morphine injection, ondansetron (ZOFRAN) IV, polyethylene glycol   Objective:  Filed Vitals:   02/26/14 0418  BP: 109/89  Pulse: 66  Temp: 97.5 F (36.4 C)  Resp: 16      Intake/Output Summary (Last 24 hours) at 02/26/14 0740 Last data filed at 02/26/14 0540  Gross per 24 hour  Intake    810 ml  Output    750 ml  Net     60 ml    ECOG PERFORMANCE STATUS: 2  GENERAL: alert, no distress and comfortable. She is obese and less jaundiced  SKIN: skin color is pale and slightly jaundiced, texture, turgor are normal, no rashes or significant lesions  EYES: normal, Conjunctiva are pale and slightly jaundiced and non-injected  OROPHARYNX:no exudate, no erythema and lips, buccal mucosa, and tongue normal  NECK: supple, thyroid normal size, non-tender, without nodularity  LYMPH: no palpable lymphadenopathy in the cervical, axillary or inguinal  LUNGS:  clear to auscultation and percussion with normal breathing effort  HEART: regular rate & rhythm and no murmurs and 1+ bilateral lower extremity edema  ABDOMEN:abdomen soft, tender at the right upper quadrant and periumbilical area. Normal bowel sounds.   Musculoskeletal:no cyanosis of digits and no clubbing  NEURO: alert & oriented x 3 with fluent speech, no focal motor/sensory deficits    CBG (last 3)  No results found for this basename: GLUCAP,  in the last 72 hours   Labs:   Recent Labs Lab 02/19/14 0844 02/22/14 1015 02/22/14 1402 02/23/14 0435 02/24/14 0500 02/25/14 0455  WBC 7.6 11.4* 13.5* 7.7 7.0 5.6  HGB 8.5* 8.8* 8.9* 8.2* 8.4* 7.7*  HCT 27.0* 28.2* 27.4* 25.6* 25.6* 23.3*  PLT 27* 51* 36* 36* 39* 39*  MCV 91.8 92.7 90.7 91.4 90.1 90.3  MCH 28.9 29.0 29.5 29.3 29.6 29.8  MCHC 31.5 31.2* 32.5 32.0 32.8 33.0  RDW 20.4* 22.0* 20.3* 20.4* 20.2* 20.3*  LYMPHSABS 5.4* 9.4* 10.8*  --   --   --   MONOABS 1.0* 0.3 0.9  --   --   --   EOSABS 0.0 0.0 0.0  --   --   --   BASOSABS 0.0 0.1 0.0  --   --   --      Chemistries:    Recent Labs Lab 02/19/14 0844 02/22/14 1015 02/22/14 1402 02/23/14 0435 02/24/14 0500 02/25/14 0455  NA 139 141 139  144 138 141  K 4.1 3.5 3.6* 3.7 3.6* 3.8  CL  --   --  98 103 99 102  CO2 30* 29 27 29 25 26   GLUCOSE 99 110 112* 115* 134* 93  BUN 18.7 12.7 13 13 16 22   CREATININE 1.1 1.0 0.98 1.00 1.05 1.08  CALCIUM 9.4 9.4 9.3 9.0 8.9 8.6  AST 71* 65* 66*  --  43* 33  ALT 37 39 37*  --  30 26  ALKPHOS 155* 162* 156*  --  128* 111  BILITOT 3.34* 3.43* 3.4*  --  2.9* 2.8*    GFR Estimated Creatinine Clearance: 51.5 ml/min (by C-G formula based on Cr of 1.08).  Liver Function Tests:  Recent Labs Lab 02/19/14 0844 02/22/14 1015 02/22/14 1402 02/24/14 0500 02/25/14 0455  AST 71* 65* 66* 43* 33  ALT 37 39 37* 30 26  ALKPHOS 155* 162* 156* 128* 111  BILITOT 3.34* 3.43* 3.4* 2.9* 2.8*  PROT 5.3* 5.4* 5.7* 5.5* 5.3*  ALBUMIN  3.2* 3.2* 3.4* 3.2* 3.1*    Recent Labs Lab 02/22/14 1402  LIPASE 21      Imaging Studies:   Ct Abdomen Pelvis W Contrast  02/25/2014 CLINICAL DATA: Epigastric pain, right upper quadrant pain, diarrhea EXAM: CT ABDOMEN AND PELVIS WITH CONTRAST TECHNIQUE: Multidetector CT imaging of the abdomen and pelvis was performed using the standard protocol following bolus administration of intravenous contrast. CONTRAST: 136mL OMNIPAQUE IOHEXOL 300 MG/ML SOLN COMPARISON: None. FINDINGS: The lung bases are clear. The liver demonstrates no focal abnormality. There is no intrahepatic or extrahepatic biliary ductal dilatation. There are cholelithiasis with mild gallbladder wall thickening. The spleen demonstrates no focal abnormality. The kidneys, adrenal glands and pancreas are normal. The bladder is unremarkable. There is bowel wall thickening involving the transverse and descending colon. 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 There is severe gastric wall thickening with a moderate amount of fluid in the excluded portion of the stomach. There is a small left paraumbilical hernia containing small bowel and a small amount of fluid. There is anasarca. There is no bowel obstruction. There is no pneumoperitoneum, pneumatosis, or portal venous gas. There is no abdominal or pelvic free fluid. There is no lymphadenopathy. The abdominal aorta is normal in caliber with atherosclerosis. There are no lytic or sclerotic osseous lesions. There is diffuse lumbar spine spondylosis. 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 IMPRESSION: 1. Mild colonic wall thickening involving the transverse and descending colon most concerning for colitis secondary to infectious or inflammatory. 2. Severe gastric wall thickening with a moderate amount of fluid in the excluded portion of the stomach. This may reflect severe gastritis versus less likely gastric malignancy. Prior gastric bypass is noted  without obstruction. 3. Cholelithiasis with mild gallbladder wall thickening. These findings can be seen with cholecystitis, but can also be reactive secondary to adjacent inflammatory process given the abnormality involving the stomach and colon. If there is concern regarding cholecystitis, further evaluation with HIDA scan is recommended. Electronically Signed By: Kathreen Devoid On: 02/25/2014 11:15   Assessment/Plan: 73 y.o.  Large granular lymphocytic leukemia  I reviewed his case extensively at the hematology tumor board. On admission, she was on 10 mg of prednisone daily..  Methotrexate was placed on hold this week due to elevated liver function tests.  In the scope of things, treatment is placed on hold. We might have to discontinue prednisone depending on results of the gastric biopsy  as outlined below  Chronic cholecystitis   I had long discussion with Gen. surgery team. Overall, the symptoms are not consistent with severe acute cholecystitis warranting surgery.  Gastric wall thickening with abnormalities in her colon on CT scan CT abdomen pelvis on 10/5 showed CT showed gastric wall thickening, fluid in excluded gastric segment, mild colonic wall thickening and cholelithiasis. The portion that was abnormal on CT scan was not visualized on EGD. Differential diagnosis included severe gastritis due to chronic prednisone therapy versus undiagnosed malignancy I have extensive discussion with the GI team, Gen. surgery team, the patient and her husband. I discussed the pros and cons of attempting a CT-guided biopsy. The patient understood risk of bleeding and possibility of a nondiagnostic biopsy but she wants to proceed with this. Had normal colonoscopy at [facility] in 2012. No plans to repeat her colonoscopy from now.  Anemia in neoplastic disease  The cause of the anemia is due to bone marrow infiltration and component of anemia of chronic disease. Infection is being ruled out, cultures negative  to date She received 1 unit of blood on admission as Hb was 8 and she was symptomatic.  No bleeding issues reported, but Hb dropped again today to 7.7 on 10/5, no labs this morning Would transfuse her with blood if hemoglobin dropped to less than 7 g or if she is symptomatic. She needs irradiated blood products.   Thrombocytopenia due to drugs  Suspect this is due to bone marrow infiltration from LGL and recent methotrexate.  She does not need transfusions of platelets now, as no bleeding issues are reported unless indicated by GI or Surgery. As such, she is to receive 1 unit of platelets prior to EGD.  She may need a platelet transfusion prior to gastric mass  Diarrhea, resolved This is a new symptom, Cdiff colitis is negative  Chronic diastolic dysfunction  On Lasix per primary team 02/23/2014 echo shows EF 86-75%, grade 1 diastolic dysfunction and no wall motion abnormalities.  Elevated total bilirubin, pancytopenia and splenomegaly Her case was reviewed at hematology tumor board and that is a possibility that patient may have chronic liver disease causing splenomegaly.  The splenomegaly could be related to liver disease or LGL leukemia She is not a candidate to pursue for further methotrexate treatment due to elevated total bilirubin  Full Code Other medical issues as per admitting team  Discharge planning She has a very complicated case. I have spent an extensive amount of time with the patient and other team members. Plan would be to proceed with CT-guided biopsy tomorrow.  **Disclaimer: This note was dictated with voice recognition software. Similar sounding words can inadvertently be transcribed and this note may contain transcription errors which may not have been corrected upon publication of note.Sharene Butters E, PA-C 02/26/2014  7:40 AM Lavetta Geier, MD 02/26/2014

## 2014-02-26 NOTE — Op Note (Signed)
[facility] [place], [address]   ENDOSCOPY PROCEDURE REPORT  PATIENT: Stacey Cooley, Stacey Cooley  MR#: [id number] BIRTHDATE: 04-21-1941 , 73  yrs. old GENDER: female ENDOSCOPIST: Ladene Artist, MD, [facility] REFERRED BY:  [facility] PROCEDURE DATE:  02/26/2014 PROCEDURE:  EGD, diagnostic ASA CLASS:     Class III INDICATIONS:  epigastric abdominal pain and abnormal CT of the GI tract. MEDICATIONS: Fentanyl 50 mcg IV and Versed 5 mg IV TOPICAL ANESTHETIC: Cetacaine Spray DESCRIPTION OF PROCEDURE: After the risks benefits and alternatives of the procedure were thoroughly explained, informed consent was obtained.  The    endoscope was introduced through the mouth and advanced to the second portion of the duodenum , Without limitations.  The instrument was slowly withdrawn as the mucosa was fully examined.    ESOPHAGUS: The mucosa of the esophagus appeared normal. STOMACH: A Roux -en-Y anastomosis was found and the visualized portion of the stomach was otherwise normal.   The portion of the stomach excluded by the Roux-en-Y was not visualized which corresponds to the abnormaly thickened gastric walls on CT. JEJUNUM: A medium sized non-bleeding  and shallow ulcer with a pigmented spot was found at the gastro-jejunal anastamosis.   The exam showed no abnormalities in the jejunum.  Retroflexion was not performed due to anatomy.  The scope was then withdrawn from the patient and the procedure completed.  COMPLICATIONS: There were no immediate complications.  ENDOSCOPIC IMPRESSION: 1.   Roux-en-Y anastomosis 2.   Medium sized non-bleeding ulcer at the gastro-jejunal anastamosis 3.   The EGD was otherwise normal post Roux-en-Y  RECOMMENDATIONS: 1.  Avoid NSAIDS long term 2.  Continue PPI long term  eSigned:  Ladene Artist, MD, [facility] 02/26/2014 3:13 PM

## 2014-02-26 NOTE — Interval H&P Note (Signed)
History and Physical Interval Note:  02/26/2014 2:37 PM  Stacey Cooley  has presented today for surgery, with the diagnosis of Upper abdominal pain and abnormal stomach on CT scan  The various methods of treatment have been discussed with the patient and family. After consideration of risks, benefits and other options for treatment, the patient has consented to  Procedure(s): ESOPHAGOGASTRODUODENOSCOPY (EGD) (N/A) as a surgical intervention .  The patient's history has been reviewed, patient examined, no change in status, stable for surgery.  I have reviewed the patient's chart and labs.  Questions were answered to the patient's satisfaction.     Pricilla Riffle. Fuller Plan MD

## 2014-02-26 NOTE — H&P (View-Only) (Signed)
Referring Provider: No ref. provider found Primary Care Physician:  Lottie Mussel, MD Primary Gastroenterologist:  Dr. Gustavo Lah in [place]  Reason for Consultation:  Abnormal CT scan with gastric and colonic wall thickening  HPI: Romaine Maciolek is a 73 y.o. 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  She was being seen by Dr.Gorsuch in undergoing treatment for leukemia, her lab work was showing elevated total bilirubin and slightly elevated liver enzymes for a few weeks, she was also experiencing some epigastric/right upper quadrant pain worse by eating food, better with bowel rest and defecation, her oncologist ordered a right upper quadrant ultrasound which showed evidence of cholecystitis, she then consulted general surgeon, Dr. Donne Hazel, over the phone who recommended that patient come to the ER.   In the ER her diagnosis was again cholecystitis, general surgery was consulted and hospitalist team was requested to admit the patient.  She underwent HIDA scan, which showed delayed appearance of the tracer activity in the gallbladder most consistent with chronic cholecystitis.  She is a high risk surgical candidate and surgery still was not convinced that her gallbladder was the issue so CT scan of the abdomen and pelvis with contrast was ordered.  This revealed the following:  "IMPRESSION:    1. Mild colonic wall thickening involving the transverse and  descending colon most concerning for colitis secondary to infectious or inflammatory.  2. Severe gastric wall thickening with a moderate amount of fluid in the excluded portion of the stomach. This may reflect severe  gastritis versus less likely gastric malignancy. Prior gastric  bypass is noted without obstruction.  3. Cholelithiasis with mild gallbladder wall thickening. These  findings can be seen with cholecystitis, but can also be reactive  secondary to adjacent inflammatory process given the abnormality involving the stomach and colon. If there is concern regarding cholecystitis, further evaluation with HIDA scan is recommended."  GI is being consulted regarding these abnormal findings.  Her last colonoscopy was in 08/2010 by Dr. Gustavo Lah in [place] at which time her prep was poor and she had a tortuous colon; there was also one non-bleeding AVM.  Repeat was recommended in 5 years from that time.  Cdiff is negative.  Her LFT's were elevated with total bili reaching 3.4 just 3 days ago but down to 2.8 today and remaining LFT's have normalized.  Platelets are 39, Hgb 7.7 grams.  She admits that she has not felt well in about 3 weeks.  Dry heaves and pain in her entire upper abdomen.  Vomited only small amounts of clear liquid a couple of times at home but no vomiting since admission.  Tolerating clear liquids here.  Also has been having diarrhea, mostly in the past couple of days even on a clear liquid diet.  Cdiff negative as stated above.  Denies NSAID use.  Is on prednisone 10 mg daily and was on a baby ASA up until a couple of weeks ago when that was discontinued.  Past Medical History  Diagnosis Date  . HTN (  hypertension)   . Morbid obesity   . Rheumatic fever     child  . Dyslipidemia   . Swelling of joint of lower leg     recurrent lower extremity leg swelling, noted by pt  . Anemia   . Hypothyroid   . Varicose  vein of leg   . MRSA (methicillin resistant staph aureus) culture positive   . TACHYCARDIA 10/17/2009    Qualifier: Diagnosis of  By: Rockey Situ MD, Tim    . Anxiety   . Cough   . Heart murmur   . Renal insufficiency 09/06/2011    DR. HA  [facility]   . Uterine cancer 1996    s/p hysterectomy; patient reported low stage; did not need adjuvant chemo  or radiation.   . DDD (degenerative disc disease), cervical   . 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 Lymphocytosis 03/12/2013  . Other pancytopenia 03/12/2013  . Weight loss 03/12/2013  . 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 Bilateral leg edema 04/09/2013  . Nausea alone 04/09/2013  . Creatinine elevation 04/16/2013  . Renal failure, acute 05/11/2013  . Sinus congestion 07/09/2013  . Headache 07/17/2013  . Abnormal liver function tests 02/19/2014  . Cholecystitis 02/21/2014    Past Surgical History  Procedure Laterality Date  . Anterior cervical decompression and fusion  09/2009    involving C4-7 levels as well as a posterior cervical fusion prcedure from C3 to T  . Tonsillectomy  1966  . Gastric stapling  12/1984 and 2001  . Abdominal hysterectomy  1996  . Right hip replacement  1997  . Right ulnar nerve decompression  2005  . Right carpal tunnel release  2005  . Total left knee replacement  2006  . Halo application  22/97/9892    Procedure: HALO TRACTION APPLICATION;  Surgeon: Sinclair Ship, MD;  Location: [place];  Service: Orthopedics;  Laterality: N/A;  . Joint replacement      RIGHT KNEE   . No past surgeries      RIGHT THUMB   . Ulnar nerve repair      RIGHT ARM 1.5 YRS AGO   . Tooth extraction      OSTEO NECROSIS    IN JAW    . Posterior cervical fusion/foraminotomy N/A 08/03/2012    Procedure: POSTERIOR CERVICAL FUSION/FORAMINOTOMY LEVEL 2;  Surgeon: Sinclair Ship, MD;  Location: [place];  Service: Orthopedics;  Laterality: N/A;  Posterior cervical fusion, cerival 1-2, cervical 2-3 with instrumentation, iliac crest autograft  . Right hip replacement    . Gastric roux-en-y      Prior to Admission medications   Medication Sig Start Date End Date Taking? Authorizing Provider  acetaminophen-codeine (TYLENOL #3) 300-30 MG per tablet Take 1-2 tablets by mouth 4 (four) times daily as needed (headache).  10/23/12  Yes Historical Provider, MD  allopurinol (ZYLOPRIM) 300 MG tablet Take 300 mg by mouth every morning.    Yes Historical Provider, MD  aspirin 81 MG tablet Take 81 mg by mouth daily.   Yes Historical Provider, MD  butalbital-acetaminophen-caffeine (FIORICET, ESGIC) 50-325-40 MG per tablet Take 1-2 tablets by mouth every 6 (six) hours as needed for headache (headache). 01/21/14  Yes Heath Lark, MD  Calcium Carbonate-Vitamin D (CALCIUM 600+D) 600-400 MG-UNIT per tablet Take 1 tablet by mouth daily.  Yes Historical Provider, MD  cholecalciferol (VITAMIN D) 1000 UNITS tablet Take 1,000 Units by mouth 2 (two) times daily.   Yes Historical Provider, MD  citalopram (CELEXA) 20 MG tablet Take 20 mg by mouth every morning.    Yes Historical Provider, MD  Cyanocobalamin (VITAMIN B-12 SL) Place 1 tablet under the tongue daily.   Yes Historical Provider, MD  fluticasone (FLONASE) 50 MCG/ACT nasal spray Place 3 sprays into the nose daily.   Yes Historical Provider, MD  folic acid (FOLVITE) 1 MG tablet Take 1 tablet (1 mg total) by mouth daily. 10/10/13  Yes Heath Lark, MD  furosemide (LASIX) 40 MG tablet Take 20 mg by mouth daily. 10/10/13  Yes Heath Lark, MD  levothyroxine (SYNTHROID, LEVOTHROID) 75 MCG tablet Take 75 mcg by mouth daily before breakfast.    Yes Historical Provider, MD  methotrexate (RHEUMATREX) 2.5 MG tablet Take 4 tablets (10 mg total) by mouth once a week. Caution:Chemotherapy. Protect from light. 01/21/14  Yes Heath Lark, MD  metoprolol tartrate  (LOPRESSOR) 25 MG tablet Take 25 mg by mouth 2 (two) times daily.    Yes Historical Provider, MD  Multiple Vitamin (MULTIVITAMIN WITH MINERALS) TABS Take 1 tablet by mouth daily. Centrum   Yes Historical Provider, MD  ondansetron (ZOFRAN) 8 MG tablet Take 8 mg by mouth every 8 (eight) hours as needed for nausea or vomiting (nausea).   Yes Historical Provider, MD  oxybutynin (DITROPAN) 5 MG tablet Take 5 mg by mouth Daily. 07/28/11  Yes Historical Provider, MD  Potassium 99 MG TABS Take 1 tablet by mouth daily.    Yes Historical Provider, MD  predniSONE (DELTASONE) 5 MG tablet Take 5 mg by mouth 2 (two) times daily with a meal.  09/10/13  Yes Heath Lark, MD    Current Facility-Administered Medications  Medication Dose Route Frequency Provider Last Rate Last Dose  . acetaminophen (TYLENOL) tablet 650 mg  650 mg Oral Q6H PRN Orson Eva, MD   650 mg at 02/23/14 2113  . allopurinol (ZYLOPRIM) tablet 300 mg  300 mg Oral q morning - 10a Thurnell Lose, MD   300 mg at 02/25/14 1051  . citalopram (CELEXA) tablet 20 mg  20 mg Oral q morning - 10a Thurnell Lose, MD   20 mg at 02/25/14 1051  . feeding supplement (RESOURCE BREEZE) (RESOURCE BREEZE) liquid 1 Container  1 Container Oral TID WC Elyse A Shearer, RD   1 Container at 02/25/14 1338  . folic acid (FOLVITE) tablet 1 mg  1 mg Oral Daily Thurnell Lose, MD   1 mg at 02/25/14 1051  . guaiFENesin-dextromethorphan (ROBITUSSIN DM) 100-10 MG/5ML syrup 5 mL  5 mL Oral Q4H PRN Thurnell Lose, MD   5 mL at 02/24/14 1415  . HYDROcodone-acetaminophen (NORCO/VICODIN) 5-325 MG per tablet 1-2 tablet  1-2 tablet Oral Q4H PRN Thurnell Lose, MD   1 tablet at 02/24/14 2036  . hydrocortisone sodium succinate (SOLU-CORTEF) 100 MG injection 50 mg  50 mg Intravenous Daily Orson Eva, MD   50 mg at 02/25/14 1051  . levothyroxine (SYNTHROID, LEVOTHROID) tablet 75 mcg  75 mcg Oral QAC breakfast Thurnell Lose, MD   75 mcg at 02/25/14 1051  . morphine 2 MG/ML injection  2 mg  2 mg Intravenous Q4H PRN Thurnell Lose, MD      . morphine 4 MG/ML injection 4 mg  4 mg Intravenous Once Mariea Clonts, MD      .  ondansetron (ZOFRAN) injection 4 mg  4 mg Intravenous Q6H PRN Thurnell Lose, MD   4 mg at 02/23/14 1232  . oxybutynin (DITROPAN) tablet 5 mg  5 mg Oral BID Thurnell Lose, MD   5 mg at 02/25/14 1051  . piperacillin-tazobactam (ZOSYN) IVPB 3.375 g  3.375 g Intravenous 3 times per day Dara Hoyer, RPH   3.375 g at 02/25/14 1338  . polyethylene glycol (MIRALAX / GLYCOLAX) packet 17 g  17 g Oral Daily PRN Thurnell Lose, MD      . sodium chloride 0.9 % injection 3 mL  3 mL Intravenous Q12H Thurnell Lose, MD   3 mL at 02/24/14 2036    Allergies as of 02/22/2014 - Review Complete 02/22/2014  Allergen Reaction Noted  . Latex Itching and Rash 09/06/2011  . Penicillins Rash   . Sulfonamide derivatives Rash     Family History  Problem Relation Age of Onset  . Coronary artery disease Neg Hx   . Cancer Mother     lung cancer  . Liver disease Father   . Anemia Sister     History   Social History  . Marital Status: Married    Spouse Name: N/A    Number of Children: N/A  . Years of Education: N/A   Occupational History  . Not on file.   Social History Main Topics  . Smoking status: Former Smoker -- 0.25 packs/day for 10 years    Quit date: 05/24/1968  . Smokeless tobacco: Never Used     Comment: no smoking  . Alcohol Use: No  . Drug Use: No  . Sexual Activity: No   Other Topics Concern  . Not on file   Social History Narrative   Lives with husband.     Review of Systems: Ten point ROS is O/W negative except as mentioned in HPI.  Physical Exam: Vital signs in last 24 hours: Temp:  [97.6 F (36.4 C)-98.1 F (36.7 C)] 98.1 F (36.7 C) (10/05 1506) Pulse Rate:  [60-76] 63 (10/05 1506) Resp:  [18-20] 18 (10/05 1506) BP: (88-102)/(47-54) 88/47 mmHg (10/05 1506) SpO2:  [97 %-100 %] 100 % (10/05 1506) Weight:  [208 lb 4.8  oz (94.484 kg)] 208 lb 4.8 oz (94.484 kg) (10/05 0500) Last BM Date: 02/24/14 General:  Alert, Well-developed, well-nourished, pleasant and cooperative in NAD Head:  Normocephalic and atraumatic. Eyes:  Sclera clear, no icterus.  Conjunctiva pink. Ears:  Normal auditory acuity. Mouth:  No deformity or lesions.   Lungs:  Clear throughout to auscultation.  No wheezes, crackles, or rhonchi.  Heart:  Regular rate and rhythm; murmur noted. Abdomen:  Soft, non-distended.  BS present.  Periumbilical incision hernia noted.  Upper abdominal TTP in epigastrium, RUQ, and LUQ.     Rectal:  Deferred  Msk:  Symmetrical without gross deformities. Pulses:  Normal pulses noted. Extremities:  Significant edema noted in B/L LE's. Neurologic:  Alert and  oriented x4;  grossly normal neurologically.  Neck with limited mobility due to previous surgery and hardware. Skin:  Intact without significant lesions or rashes. Psych:  Alert and cooperative. Normal mood and affect.  Intake/Output from previous day: 10/04 0701 - 10/05 0700 In: 1110 [P.O.:960; IV Piggyback:150] Out: 6295 [Urine:1650]  Lab Results:  Recent Labs  02/23/14 0435 02/24/14 0500 02/25/14 0455  WBC 7.7 7.0 5.6  HGB 8.2* 8.4* 7.7*  HCT 25.6* 25.6* 23.3*  PLT 36* 39* 39*   BMET  Recent Labs  02/23/14 0435 02/24/14 0500 02/25/14 0455  NA 144 138 141  K 3.7 3.6* 3.8  CL 103 99 102  CO2 29 25 26   GLUCOSE 115* 134* 93  BUN 13 16 22   CREATININE 1.00 1.05 1.08  CALCIUM 9.0 8.9 8.6   LFT  Recent Labs  02/25/14 0455  PROT 5.3*  ALBUMIN 3.1*  AST 33  ALT 26  ALKPHOS 111  BILITOT 2.8*   Studies/Results: Ct Abdomen Pelvis W Contrast  02/25/2014   CLINICAL DATA:  Epigastric pain, right upper quadrant pain, diarrhea  EXAM: CT ABDOMEN AND PELVIS WITH CONTRAST  TECHNIQUE: Multidetector CT imaging of the abdomen and pelvis was performed using the standard protocol following bolus administration of intravenous contrast.   CONTRAST:  170mL OMNIPAQUE IOHEXOL 300 MG/ML  SOLN  COMPARISON:  None.  FINDINGS: The lung bases are clear.  The liver demonstrates no focal abnormality. There is no intrahepatic or extrahepatic biliary ductal dilatation. There are cholelithiasis with mild gallbladder wall thickening. The spleen demonstrates no focal abnormality. The kidneys, adrenal glands and pancreas are normal. The bladder is unremarkable.  There is bowel wall thickening involving the transverse and descending colon. 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 There is severe gastric wall thickening with a moderate amount of fluid in the excluded portion of the stomach. There is a small left paraumbilical hernia containing small bowel and a small amount of fluid. There is anasarca. There is no bowel obstruction. There is no pneumoperitoneum, pneumatosis, or portal venous gas. There is no abdominal or pelvic free fluid. There is no lymphadenopathy.  The abdominal aorta is normal in caliber with atherosclerosis.  There are no lytic or sclerotic osseous lesions. There is diffuse lumbar spine spondylosis. 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  IMPRESSION: 1. Mild colonic wall thickening involving the transverse and descending colon most concerning for colitis secondary to infectious or inflammatory. 2. Severe gastric wall thickening with a moderate amount of fluid in the excluded portion of the stomach. This may reflect severe gastritis versus less likely gastric malignancy. Prior gastric bypass is noted without obstruction. 3. Cholelithiasis with mild gallbladder wall thickening. These findings can be seen with cholecystitis, but can also be reactive secondary to adjacent inflammatory process given the abnormality involving the stomach and colon. If there is concern regarding cholecystitis, further evaluation with HIDA scan is recommended.   Electronically Signed   By: Kathreen Devoid   On: 02/25/2014  11:15    IMPRESSION:  -73 year old female with upper abdominal pain, nausea, dry heaves, elevated LFT's, and imaging studies suggesting chronic cholecystitis.  CT scan also showing gastric wall thickening. -Diarrhea:  Cdiff negative.  CT scan also showing some colonic wall thickening.  Last colonoscopy 08/2010. -Leukemia:  With anemia (Hgb 7.7 grams) and thrombocytopenia (platelets 39). -Multiple other medical problems -S/p Roux-en-Y gastric bypass surgery  PLAN: -Will attempt EGD, 10/6, to further evaluate the gastric abnormalities on CT scan.  Rule out malignancy, ulcer disease, gastritis, etc.  Will give one unit of platelets tomorrow AM. -EGD findings still will not explain elevated LFT's and abnormal gallbladder imaging so the chronic cholecystitis issue will still need to be addressed per surgery.   ZEHR, JESSICA D.  02/25/2014, 3:11 PM  Pager number [phone]      Attending physician's note   I have taken a history, examined the patient and reviewed the chart. I agree with the Advanced Practitioner's note, impression and  recommendations. Upper abd pain, N/V. Prior Roux-en-Y gastric bypass 10 years ago in [place]. CT showed gastric wall thickening, fluid in excluded gastric segment, mild colonic wall thickening and cholelithiasis. HIDA showed delayed GB filling c/w chronic cholecystitis. Mildly elevated LFTs however only t bili is mildly elevated today. I am concerned that she has chronic cholecystitis-mgmt per General Surgery. Attempt EGD tomorrow after platelet transfusion. Intubation at EGD could be impeded by cervical spine fusion. Colonic wall thickening is mild and likely nonspecific. Had normal colonoscopy at [facility] in 2012. No plans to repeat her colonoscopy at this time.   Ladene Artist, MD [facility]

## 2014-02-26 NOTE — Progress Notes (Signed)
PROGRESS NOTE  Stacey Cooley XBL:[id number] DOB: Apr 28, 1941 DOA: 02/22/2014 PCP: Lottie Mussel, MD  Brief history  73 year old female with a history of lymphocytic leukemia, hypertension, hypothyroidism, chronic lower extremity edema presents with 2-3 weeks of worsening epigastric and right upper quadrant pain associated with intermittent dry heaving that occurs with meals. She also stated that the pain somewhat worsened with eating. In addition, the patient was noted to have increased LFTs and increasing bilirubin over the last few weeks. The patient's oncologist, Dr. Alvy Bimler, ordered an abdominal ultrasound which was suggestive of possible cholecystitis. As a result the patient was admitted, and general surgery was consulted. HIDA scan has been performed, and it was negative for acute cholecystitis, but suggested chronic cholecystitis. At this time, general surgery feels that the patient should be treated conservatively with antibiotics given her multiple comorbidities. CT of the abdomen and pelvis has been ordered as pt continues to have abdominal pain. CT abdomen and pelvis revealed bowel wall thickening of the transverse and ascending colon with severe gastric wall thickening. As a result, gastroenterology was consulted.  Assessment/Plan:  Abdominal pain/chronic cholecystitis  -Appreciate general surgery followup  -Continue antibiotics  -WBC has improved  -CT abdomen pelvis as recommended by surgery if no clinical improvement  -Trend WBC and LFTs  -HIDA negative for acute cholecystitis but suggests chronic cholecystitis  -02/25/2014 CT abdomen and pelvis--severe gastric wall thickening as well as bowel thickening of the transverse and ascending colon  Gastric and Colon Wall thickening  -Appreciate GI 02/26/2014 EGD--nonbleeding ulcer GJ anastomosis -Cdiff PCR--neg  Large granular lymphocytic leukemia  -pt follows Dr. Ivery Quale plan for stomach biopsy is EGD  unrevealing -10/6--case discussed with Dr. Alvy Bimler  -continue IV stress steroids for today but decrease dose  -transfuse platelets and packed RBCs as desired. Will defer platelet transfusion to Gen. surgery based on their comfort level, particularly if surgery is needed  -presently no signs of active bleed  -hold Methotrexate  -continue stress steroids  -Transfuse PRBC for hemoglobin less than 7  Diarrhea  -C.diff PCR--neg  Hypertension  -d/c metoprolol tartrate as BP is soft  Chronic lower extremity edema/diastolic dysfunction  -presently denies any dyspnea, lungs are clear  -02/23/2001 echo EF 30-07%, grade 1 diastolic dysfunction, no WMA  -Restart furosemide home dose Hypothyroidism  -Continue Synthroid  Gouty Arthritis  -Continue allopurinol  Family Communication: Husband updated at beside  Disposition Plan: Home when cleared by surgery and GI  Antibiotics:  Zosyn 02/22/14>>>     Procedures/Studies: Dg Chest 2 View  02/22/2014   CLINICAL DATA:  Preoperative exam prior to cholecystectomy; cough  EXAM: CHEST  2 VIEW  COMPARISON:  PA and lateral chest x-ray of August 02, 2012  FINDINGS: The lungs are adequately inflated. The interstitial markings are mildly increased bilaterally. The cardiopericardial silhouette is enlarged and the central pulmonary vascularity is engorged. There is tortuosity of the descending thoracic aorta. The bony thorax exhibits no acute abnormalities. The patient has undergone previous cervical and upper thoracic fusion.  IMPRESSION: There is low grade CHF with mild interstitial edema. There is no pleural effusion nor evidence of pneumonia.   Electronically Signed   By: Awesome Jared  [place]   On: 02/22/2014 15:21   Nm Hepatobiliary  02/23/2014   CLINICAL DATA:  Abnormal liver function studies. Abdominal pain in the right upper quadrant with nausea for 3 weeks.  EXAM: NUCLEAR MEDICINE HEPATOBILIARY IMAGING  TECHNIQUE: Sequential images of the abdomen were obtained  out  to 60 minutes following intravenous administration of radiopharmaceutical.  RADIOPHARMACEUTICALS:  5.5 Millicurie CZ-66A Choletec  COMPARISON:  Ultrasound abdomen 02/21/2014.  FINDINGS: There is normal homogeneous uptake and distribution of activity throughout the liver with normal washout and prompt appearance of activity in the intrahepatic and extrahepatic bile ducts and small bowel. There is delayed appearance of activity in the gallbladder, seen only at 3-4 hr after injection. Findings are compatible with chronic cholecystitis. Acute cholecystitis can sometimes demonstrate delayed uptake as well. No evidence of biliary obstruction.  IMPRESSION: Delayed appearance of tracer activity in the gallbladder most consistent with chronic cholecystitis. No evidence of biliary obstruction.   Electronically Signed   By: Lucienne Capers M.D.   On: 02/23/2014 04:30   Ct Abdomen Pelvis W Contrast  02/25/2014   CLINICAL DATA:  Epigastric pain, right upper quadrant pain, diarrhea  EXAM: CT ABDOMEN AND PELVIS WITH CONTRAST  TECHNIQUE: Multidetector CT imaging of the abdomen and pelvis was performed using the standard protocol following bolus administration of intravenous contrast.  CONTRAST:  139mL OMNIPAQUE IOHEXOL 300 MG/ML  SOLN  COMPARISON:  None.  FINDINGS: The lung bases are clear.  The liver demonstrates no focal abnormality. There is no intrahepatic or extrahepatic biliary ductal dilatation. There are cholelithiasis with mild gallbladder wall thickening. The spleen demonstrates no focal abnormality. The kidneys, adrenal glands and pancreas are normal. The bladder is unremarkable.  There is bowel wall thickening involving the transverse and descending colon. 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 There is severe gastric wall thickening with a moderate amount of fluid in the excluded portion of the stomach. There is a small left paraumbilical hernia containing small bowel and a small amount of fluid. There is  anasarca. There is no bowel obstruction. There is no pneumoperitoneum, pneumatosis, or portal venous gas. There is no abdominal or pelvic free fluid. There is no lymphadenopathy.  The abdominal aorta is normal in caliber with atherosclerosis.  There are no lytic or sclerotic osseous lesions. There is diffuse lumbar spine spondylosis. 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  IMPRESSION: 1. Mild colonic wall thickening involving the transverse and descending colon most concerning for colitis secondary to infectious or inflammatory. 2. Severe gastric wall thickening with a moderate amount of fluid in the excluded portion of the stomach. This may reflect severe gastritis versus less likely gastric malignancy. Prior gastric bypass is noted without obstruction. 3. Cholelithiasis with mild gallbladder wall thickening. These findings can be seen with cholecystitis, but can also be reactive secondary to adjacent inflammatory process given the abnormality involving the stomach and colon. If there is concern regarding cholecystitis, further evaluation with HIDA scan is recommended.   Electronically Signed   By: Kathreen Devoid   On: 02/25/2014 11:15   US Abdomen Limited  02/21/2014   CLINICAL DATA:  Transaminitis. History of laparoscopic band surgery.  EXAM: US ABDOMEN LIMITED - RIGHT UPPER QUADRANT  COMPARISON:  Abdominal pelvic CT 03/12/2013.  FINDINGS: Gallbladder:  Gallstones are noted within the gallbladder neck. There is moderate gallbladder wall thickening to 7 mm. There is a small amount of pericholecystic fluid, and fluid is also present within Morison's pouch. Sonographic Percell Miller sign is positive according to the sonographer.  Common bile duct:  Diameter: 5 mm.  No evidence of choledocholithiasis.  Liver:  No focal lesion identified. Within normal limits in parenchymal echogenicity.  IMPRESSION: 1. Cholelithiasis, gallbladder wall thickening and positive sonographic  Murphy sign  worrisome for acute cholecystitis. 2. No biliary dilatation. 3. These results will be called to the ordering clinician or representative by the Radiologist Assistant, and communication documented in the PACS or zVision Dashboard.   Electronically Signed   By: Camie Patience M.D.   On: 02/21/2014 09:28         Subjective: Overall abdominal pain has improved. However, she continues to have some abdominal pain with oral intake.  Denies fevers, chills, chest pain shortness breath.  Objective: Filed Vitals:   02/26/14 1510 02/26/14 1520 02/26/14 1530 02/26/14 1540  BP: 91/47 100/42 93/41 90/44   Pulse: 75 76 62 65  Temp:      TempSrc:      Resp: 15 8 12 13   Height:      Weight:      SpO2: 98% 96% 95% 94%    Intake/Output Summary (Last 24 hours) at 02/26/14 1904 Last data filed at 02/26/14 1600  Gross per 24 hour  Intake    720 ml  Output   1350 ml  Net   -630 ml   Weight change: 1.134 kg (2 lb 8 oz) Exam:   General:  Pt is alert, follows commands appropriately, not in acute distress  HEENT: No icterus, No thrush, No neck mass, [place]/AT  Cardiovascular: RRR, S1/S2, no rubs, no gallops  Respiratory: CTA bilaterally, no wheezing, no crackles, no rhonchi  Abdomen: Soft/+BS, epigastric tenderness without guarding, non distended, no guarding  Extremities: 2+LE edema, No lymphangitis, No petechiae, No rashes, no synovitis  Data Reviewed: Basic Metabolic Panel:  Recent Labs Lab 02/22/14 1015 02/22/14 1402 02/23/14 0435 02/24/14 0500 02/25/14 0455  NA 141 139 144 138 141  K 3.5 3.6* 3.7 3.6* 3.8  CL  --  98 103 99 102  CO2 29 27 29 25 26   GLUCOSE 110 112* 115* 134* 93  BUN 12.7 13 13 16 22   CREATININE 1.0 0.98 1.00 1.05 1.08  CALCIUM 9.4 9.3 9.0 8.9 8.6   Liver Function Tests:  Recent Labs Lab 02/22/14 1015 02/22/14 1402 02/24/14 0500 02/25/14 0455  AST 65* 66* 43* 33  ALT 39 37* 30 26  ALKPHOS 162* 156* 128* 111  BILITOT 3.43* 3.4* 2.9* 2.8*   PROT 5.4* 5.7* 5.5* 5.3*  ALBUMIN 3.2* 3.4* 3.2* 3.1*    Recent Labs Lab 02/22/14 1402  LIPASE 21   No results found for this basename: AMMONIA,  in the last 168 hours CBC:  Recent Labs Lab 02/22/14 1015 02/22/14 1402 02/23/14 0435 02/24/14 0500 02/25/14 0455  WBC 11.4* 13.5* 7.7 7.0 5.6  NEUTROABS 1.6 1.8  --   --   --   HGB 8.8* 8.9* 8.2* 8.4* 7.7*  HCT 28.2* 27.4* 25.6* 25.6* 23.3*  MCV 92.7 90.7 91.4 90.1 90.3  PLT 51* 36* 36* 39* 39*   Cardiac Enzymes: No results found for this basename: CKTOTAL, CKMB, CKMBINDEX, TROPONINI,  in the last 168 hours BNP: No components found with this basename: POCBNP,  CBG: No results found for this basename: GLUCAP,  in the last 168 hours  Recent Results (from the past 240 hour(s))  TECHNOLOGIST REVIEW     Status: None   Collection Time    02/19/14  8:44 AM      Result Value Ref Range Status   Technologist Review Variant lymphs present   Final  TECHNOLOGIST REVIEW     Status: None   Collection Time    02/22/14 10:15 AM      Result Value Ref  Range Status   Technologist Review Variant lymphs present, rare myelocyte and nRBC    Final  CULTURE, BLOOD (ROUTINE X 2)     Status: None   Collection Time    02/22/14  4:03 PM      Result Value Ref Range Status   Specimen Description BLOOD RIGHT ANTECUBITAL   Final   Special Requests BOTTLES DRAWN AEROBIC AND ANAEROBIC 10 CC   Final   Culture  Setup Time     Final   Value: 02/22/2014 22:16     Performed at [facility]   Culture     Final   Value:        BLOOD CULTURE RECEIVED NO GROWTH TO DATE CULTURE WILL BE HELD FOR 5 DAYS BEFORE ISSUING A FINAL NEGATIVE REPORT     Performed at [facility]   Report Status PENDING   Incomplete  CULTURE, BLOOD (ROUTINE X 2)     Status: None   Collection Time    02/22/14  4:08 PM      Result Value Ref Range Status   Specimen Description BLOOD RIGHT ANTECUBITAL   Final   Special Requests BOTTLES DRAWN AEROBIC AND ANAEROBIC 10 CC    Final   Culture  Setup Time     Final   Value: 02/22/2014 22:15     Performed at [facility]   Culture     Final   Value:        BLOOD CULTURE RECEIVED NO GROWTH TO DATE CULTURE WILL BE HELD FOR 5 DAYS BEFORE ISSUING A FINAL NEGATIVE REPORT     Performed at [facility]   Report Status PENDING   Incomplete  CLOSTRIDIUM DIFFICILE BY PCR     Status: None   Collection Time    02/25/14 10:15 AM      Result Value Ref Range Status   C difficile by pcr NEGATIVE  NEGATIVE Final   Comment: Performed at [facility]     Scheduled Meds: . allopurinol  300 mg Oral q morning - 10a  . citalopram  20 mg Oral q morning - 10a  . feeding supplement (RESOURCE BREEZE)  1 Container Oral TID WC  . folic acid  1 mg Oral Daily  . hydrocortisone sod succinate (SOLU-CORTEF) inj  50 mg Intravenous Daily  . levothyroxine  75 mcg Oral QAC breakfast  .  morphine injection  4 mg Intravenous Once  . oxybutynin  5 mg Oral BID  . pantoprazole  40 mg Oral BID  . piperacillin-tazobactam (ZOSYN)  IV  3.375 g Intravenous 3 times per day  . sodium chloride  3 mL Intravenous Q12H   Continuous Infusions:    Nalia Honeycutt, DO  [facility] Pager [phone]  If 7PM-7AM, please contact night-coverage [email or web address] Password [email or web address] 02/26/2014, 7:04 PM   LOS: 4 days

## 2014-02-26 NOTE — Progress Notes (Addendum)
General Surgery Note  LOS: 4 days  POD -     Assessment/Plan: 1.  Gall bladder disease/cholelithiasis  Gall stones known since 03/16/2013 on CT scan  Though HIDA scan on 02/23/2014 visualized GB  On Zosyn  Note:  Her T Bili has been elevated some since 12/04/2013, her Alk Phos elevated since 90/22/2015.  Alk phos now normal.   2.  CT scan shows severe gastric wall thickening  For upper endo by Dr. 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  3.  Mild colonic wall thickening of transverse colon and descending colon  C diff - neg  4.  Abdominal wall hernia  Present on CT scan on 03/16/2014  This is unchanged on PE and CT scan 02/26/2104  5.  Large granular lymphocytic leukemia  Ni Gorsuch oncologist  She as diagnosed in January - but has struggle with her treatment. 6.  Anemia - Hgb - 7.7 - 02/25/2014  Thought to be secondary to bone marrow infiltration and component of anemia of chronic disease 7.  Thrombocytopenia - 39,000 - 02/25/2014 8.  On chronic steroids  9.  DVT prophylaxis - on hold with thrombocytopenia 10.  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  She now has a marginal ulcer found in endo by Dr. Fuller Plan on 02/26/2014 9.  On isolation for diarrhea  C.Diff pending   Principal Problem:   Cholecystitis Active Problems:   Transaminitis   Obesity   Large granular lymphocytic leukemia   Anemia in neoplastic disease   Abnormal liver function tests   Acute cholecystitis   Chronic cholecystitis   Cholelithiasis   Abnormal CT scan, stomach   Abnormal CT scan, colon   Thrombocytopenia   Subjective:  She feels better.  No abdominal pain.  She is worried about her platelets.  Her husband is in the room. Objective:   Filed Vitals:   02/26/14 0418  BP: 109/89  Pulse: 66  Temp: 97.5 F (36.4  C)  Resp: 16     Intake/Output from previous day:  10/05 0701 - 10/06 0700 In: 27 [P.O.:660; IV Piggyback:150] Out: 750 [Urine:600; Stool:150]  Intake/Output this shift:      Physical Exam:   General: obese WF who is alert and oriented.    HEENT: Normal. Pupils equal. .   Lungs: Clear   Abdomen: Obese.  Soft with no pain..  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   Lab Results:     Recent Labs  02/24/14 0500 02/25/14 0455  WBC 7.0 5.6  HGB 8.4* 7.7*  HCT 25.6* 23.3*  PLT 39* 39*    BMET    Recent Labs  02/24/14 0500 02/25/14 0455  NA 138 141  K 3.6* 3.8  CL 99 102  CO2 25 26  GLUCOSE 134* 93  BUN 16 22  CREATININE 1.05 1.08  CALCIUM 8.9 8.6    PT/INR  No results found for this basename: LABPROT, INR,  in the last 72 hours  ABG  No results found for this basename: PHART, PCO2, PO2, HCO3,  in the last 72 hours   Studies/Results:  Ct Abdomen Pelvis W Contrast  02/25/2014   CLINICAL DATA:  Epigastric pain, right upper quadrant pain, diarrhea  EXAM: CT ABDOMEN AND  PELVIS WITH CONTRAST  TECHNIQUE: Multidetector CT imaging of the abdomen and pelvis was performed using the standard protocol following bolus administration of intravenous contrast.  CONTRAST:  136m OMNIPAQUE IOHEXOL 300 MG/ML  SOLN  COMPARISON:  None.  FINDINGS: The lung bases are clear.  The liver demonstrates no focal abnormality. There is no intrahepatic or extrahepatic biliary ductal dilatation. There are cholelithiasis with mild gallbladder wall thickening. The spleen demonstrates no focal abnormality. The kidneys, adrenal glands and pancreas are normal. The bladder is unremarkable.  There is bowel wall thickening involving the transverse and descending colon. 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 There is severe gastric wall thickening with a moderate amount of fluid in the excluded portion of the stomach. There is a small left paraumbilical hernia  containing small bowel and a small amount of fluid. There is anasarca. There is no bowel obstruction. There is no pneumoperitoneum, pneumatosis, or portal venous gas. There is no abdominal or pelvic free fluid. There is no lymphadenopathy.  The abdominal aorta is normal in caliber with atherosclerosis.  There are no lytic or sclerotic osseous lesions. There is diffuse lumbar spine spondylosis. 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  IMPRESSION: 1. Mild colonic wall thickening involving the transverse and descending colon most concerning for colitis secondary to infectious or inflammatory. 2. Severe gastric wall thickening with a moderate amount of fluid in the excluded portion of the stomach. This may reflect severe gastritis versus less likely gastric malignancy. Prior gastric bypass is noted without obstruction. 3. Cholelithiasis with mild gallbladder wall thickening. These findings can be seen with cholecystitis, but can also be reactive secondary to adjacent inflammatory process given the abnormality involving the stomach and colon. If there is concern regarding cholecystitis, further evaluation with HIDA scan is recommended.   Electronically Signed   By: HKathreen Devoid  On: 02/25/2014 11:15     Anti-infectives:   Anti-infectives   Start     Dose/Rate Route Frequency Ordered Stop   02/22/14 2200  piperacillin-tazobactam (ZOSYN) IVPB 3.375 g     3.375 g 12.5 mL/hr over 240 Minutes Intravenous 3 times per day 02/22/14 1549     02/22/14 1600  piperacillin-tazobactam (ZOSYN) IVPB 3.375 g     3.375 g 100 mL/hr over 30 Minutes Intravenous STAT 02/22/14 1547 02/22/14 1736      DAlphonsa Overall MD, FACS Pager: [facility] Office: [phone]/10/2013

## 2014-02-27 ENCOUNTER — Encounter (HOSPITAL_COMMUNITY): Payer: Self-pay | Admitting: Gastroenterology

## 2014-02-27 ENCOUNTER — Encounter (HOSPITAL_COMMUNITY)
Admission: EM | Disposition: A | Discharge status: Home / Self Care | Payer: Self-pay | Source: Home / Self Care | Attending: Internal Medicine

## 2014-02-27 ENCOUNTER — Inpatient Hospital Stay (HOSPITAL_COMMUNITY): Payer: Medicare Other

## 2014-02-27 DIAGNOSIS — K8010 Calculus of gallbladder with chronic cholecystitis without obstruction: Principal | ICD-10-CM

## 2014-02-27 LAB — PREPARE RBC (CROSSMATCH)

## 2014-02-27 LAB — PREPARE PLATELET PHERESIS: UNIT DIVISION: 0

## 2014-02-27 LAB — CBC WITH DIFFERENTIAL/PLATELET
BASOS ABS: 0 10*3/uL (ref 0.0–0.1)
Basophils Relative: 0 % (ref 0–1)
EOS PCT: 0 % (ref 0–5)
Eosinophils Absolute: 0 10*3/uL (ref 0.0–0.7)
HCT: 20 % — ABNORMAL LOW (ref 36.0–46.0)
Hemoglobin: 6.5 g/dL — CL (ref 12.0–15.0)
LYMPHS PCT: 61 % — AB (ref 12–46)
Lymphs Abs: 2.1 10*3/uL (ref 0.7–4.0)
MCH: 29.8 pg (ref 26.0–34.0)
MCHC: 32.5 g/dL (ref 30.0–36.0)
MCV: 91.7 fL (ref 78.0–100.0)
Monocytes Absolute: 0.6 10*3/uL (ref 0.1–1.0)
Monocytes Relative: 18 % — ABNORMAL HIGH (ref 3–12)
Neutro Abs: 0.7 10*3/uL — ABNORMAL LOW (ref 1.7–7.7)
Neutrophils Relative %: 21 % — ABNORMAL LOW (ref 43–77)
PLATELETS: 56 10*3/uL — AB (ref 150–400)
RBC: 2.18 MIL/uL — ABNORMAL LOW (ref 3.87–5.11)
RDW: 20.8 % — AB (ref 11.5–15.5)
WBC: 3.4 10*3/uL — ABNORMAL LOW (ref 4.0–10.5)

## 2014-02-27 LAB — COMPREHENSIVE METABOLIC PANEL
ALT: 22 U/L (ref 0–35)
AST: 31 U/L (ref 0–37)
Albumin: 2.9 g/dL — ABNORMAL LOW (ref 3.5–5.2)
Alkaline Phosphatase: 92 U/L (ref 39–117)
Anion gap: 9 (ref 5–15)
BUN: 16 mg/dL (ref 6–23)
CALCIUM: 8.7 mg/dL (ref 8.4–10.5)
CO2: 28 mEq/L (ref 19–32)
CREATININE: 0.98 mg/dL (ref 0.50–1.10)
Chloride: 103 mEq/L (ref 96–112)
GFR calc Af Amer: 66 mL/min — ABNORMAL LOW (ref >90)
GFR calc non Af Amer: 57 mL/min — ABNORMAL LOW (ref >90)
Glucose, Bld: 79 mg/dL (ref 70–99)
Potassium: 3.5 mEq/L — ABNORMAL LOW (ref 3.7–5.3)
Sodium: 140 mEq/L (ref 137–147)
Total Bilirubin: 2.2 mg/dL — ABNORMAL HIGH (ref 0.3–1.2)
Total Protein: 5 g/dL — ABNORMAL LOW (ref 6.0–8.3)

## 2014-02-27 LAB — PROTIME-INR
INR: 1.59 — AB (ref 0.00–1.49)
PROTHROMBIN TIME: 19.1 s — AB (ref 11.6–15.2)

## 2014-02-27 LAB — APTT: APTT: 32 s (ref 24–37)

## 2014-02-27 SURGERY — COLONOSCOPY
Anesthesia: Moderate Sedation

## 2014-02-27 SURGERY — COLONOSCOPY WITH PROPOFOL
Anesthesia: Monitor Anesthesia Care

## 2014-02-27 MED ORDER — PIPERACILLIN-TAZOBACTAM 3.375 G IVPB
3.3750 g | Freq: Three times a day (TID) | INTRAVENOUS | Status: DC
  Administered 2014-02-27 – 2014-02-28 (×3): 3.375 g via INTRAVENOUS
  Filled 2014-02-27 (×4): qty 50

## 2014-02-27 MED ORDER — SODIUM CHLORIDE 0.9 % IV SOLN
Freq: Once | INTRAVENOUS | Status: AC
  Administered 2014-02-27: 20:00:00 via INTRAVENOUS

## 2014-02-27 MED ORDER — PIPERACILLIN-TAZOBACTAM 3.375 G IVPB 30 MIN
3.3750 g | Freq: Once | INTRAVENOUS | Status: AC
  Administered 2014-02-27: 3.375 g via INTRAVENOUS
  Filled 2014-02-27: qty 50

## 2014-02-27 MED ORDER — UNJURY CHICKEN SOUP POWDER
8.0000 [oz_av] | Freq: Three times a day (TID) | ORAL | Status: DC
  Administered 2014-02-27 – 2014-02-28 (×3): 8 [oz_av] via ORAL
  Filled 2014-02-27 (×6): qty 27

## 2014-02-27 NOTE — Progress Notes (Signed)
PROGRESS NOTE  Stacey Cooley OFB:[id number] DOB: [birth year] DOA: 02/22/2014 PCP: Lottie Mussel, MD  Brief history  73 year old female with a history of lymphocytic leukemia, hypertension, hypothyroidism, chronic lower extremity edema presents with 2-3 weeks of worsening epigastric and right upper quadrant pain associated with intermittent dry heaving that occurs with meals. She also stated that the pain somewhat worsened with eating. In addition, the patient was noted to have increased LFTs and increasing bilirubin over the last few weeks. The patient's oncologist, Dr. Alvy Bimler, ordered an abdominal ultrasound which was suggestive of possible cholecystitis. As a result the patient was admitted, and general surgery was consulted. HIDA scan has been performed, and it was negative for acute cholecystitis, but suggested chronic cholecystitis. At this time, general surgery feels that the patient should be treated conservatively with antibiotics given her multiple comorbidities. CT of the abdomen and pelvis has been ordered as pt continues to have abdominal pain. CT abdomen and pelvis revealed bowel wall thickening of the transverse and ascending colon with severe gastric wall thickening. As a result, gastroenterology was consulted.   Assessment/Plan:  Abdominal pain/chronic cholecystitis  -Appreciate general surgery followup  -Continue antibiotics  -WBC has improved  -CT abdomen pelvis as recommended by surgery if no clinical improvement  -Trend WBC and LFTs  -HIDA negative for acute cholecystitis but suggests chronic cholecystitis  -02/25/2014 CT abdomen and pelvis--severe gastric wall thickening as well as bowel thickening of the transverse and ascending colon   Gastric and Colon Wall thickening  -Appreciate GI 02/26/2014 EGD--nonbleeding ulcer GJ anastomosis -Cdiff PCR--neg   Large granular lymphocytic leukemia  -pt follows Dr. Ivery Quale plan for stomach biopsy is EGD  unrevealing -10/6--case discussed with Dr. Alvy Bimler  -continue IV stress steroids for today but decrease dose  -transfuse platelets and packed RBCs as desired. Will defer platelet transfusion to Gen. surgery based on their comfort level, particularly if surgery is needed  -presently no signs of active bleed  -hold Methotrexate  -continue stress steroids  -Transfuse PRBC for hemoglobin less than 7   Diarrhea  -C.diff PCR--neg   Hypertension  -d/c metoprolol tartrate as BP is soft   Chronic lower extremity edema/diastolic dysfunction  -presently denies any dyspnea, lungs are clear  -02/23/2001 echo EF 78-24%, grade 1 diastolic dysfunction, no WMA  - Will hold the lasix as BP is soft  Hypothyroidism  -Continue Synthroid   Gouty Arthritis  -Continue allopurinol   Family Communication: Husband updated at beside  Disposition Plan: Home when cleared by surgery and GI  Antibiotics:  Zosyn 02/22/14>>>     Procedures/Studies: Dg Chest 2 View  02/22/2014   CLINICAL DATA:  Preoperative exam prior to cholecystectomy; cough  EXAM: CHEST  2 VIEW  COMPARISON:  PA and lateral chest x-ray of August 02, 2012  FINDINGS: The lungs are adequately inflated. The interstitial markings are mildly increased bilaterally. The cardiopericardial silhouette is enlarged and the central pulmonary vascularity is engorged. There is tortuosity of the descending thoracic aorta. The bony thorax exhibits no acute abnormalities. The patient has undergone previous cervical and upper thoracic fusion.  IMPRESSION: There is low grade CHF with mild interstitial edema. There is no pleural effusion nor evidence of pneumonia.   Electronically Signed   By: David  Martinique   On: 02/22/2014 15:21   Nm Hepatobiliary  02/23/2014   CLINICAL DATA:  Abnormal liver function studies. Abdominal pain in the right upper quadrant with nausea for 3 weeks.  EXAM: NUCLEAR MEDICINE HEPATOBILIARY IMAGING  TECHNIQUE: Sequential images of the abdomen  were obtained out to 60 minutes following intravenous administration of radiopharmaceutical.  RADIOPHARMACEUTICALS:  5.5 [place]ie [id number] Choletec  COMPARISON:  Ultrasound abdomen 02/21/2014.  FINDINGS: There is normal homogeneous uptake and distribution of activity throughout the liver with normal washout and prompt appearance of activity in the intrahepatic and extrahepatic bile ducts and small bowel. There is delayed appearance of activity in the gallbladder, seen only at 3-4 hr after injection. Findings are compatible with chronic cholecystitis. Acute cholecystitis can sometimes demonstrate delayed uptake as well. No evidence of biliary obstruction.  IMPRESSION: Delayed appearance of tracer activity in the gallbladder most consistent with chronic cholecystitis. No evidence of biliary obstruction.   Electronically Signed   By: Lucienne Capers M.D.   On: 02/23/2014 04:30   Ct Abdomen Pelvis W Contrast  02/25/2014   CLINICAL DATA:  Epigastric pain, right upper quadrant pain, diarrhea  EXAM: CT ABDOMEN AND PELVIS WITH CONTRAST  TECHNIQUE: Multidetector CT imaging of the abdomen and pelvis was performed using the standard protocol following bolus administration of intravenous contrast.  CONTRAST:  156mL OMNIPAQUE IOHEXOL 300 MG/ML  SOLN  COMPARISON:  None.  FINDINGS: The lung bases are clear.  The liver demonstrates no focal abnormality. There is no intrahepatic or extrahepatic biliary ductal dilatation. There are cholelithiasis with mild gallbladder wall thickening. The spleen demonstrates no focal abnormality. The kidneys, adrenal glands and pancreas are normal. The bladder is unremarkable.  There is bowel wall thickening involving the transverse and descending colon. 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 There is severe gastric wall thickening with a moderate amount of fluid in the excluded portion of the stomach. There is a small left paraumbilical hernia containing small bowel and a small amount of  fluid. There is anasarca. There is no bowel obstruction. There is no pneumoperitoneum, pneumatosis, or portal venous gas. There is no abdominal or pelvic free fluid. There is no lymphadenopathy.  The abdominal aorta is normal in caliber with atherosclerosis.  There are no lytic or sclerotic osseous lesions. There is diffuse lumbar spine spondylosis. 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  IMPRESSION: 1. Mild colonic wall thickening involving the transverse and descending colon most concerning for colitis secondary to infectious or inflammatory. 2. Severe gastric wall thickening with a moderate amount of fluid in the excluded portion of the stomach. This may reflect severe gastritis versus less likely gastric malignancy. Prior gastric bypass is noted without obstruction. 3. Cholelithiasis with mild gallbladder wall thickening. These findings can be seen with cholecystitis, but can also be reactive secondary to adjacent inflammatory process given the abnormality involving the stomach and colon. If there is concern regarding cholecystitis, further evaluation with HIDA scan is recommended.   Electronically Signed   By: Kathreen Devoid   On: 02/25/2014 11:15   US Abdomen Limited  02/21/2014   CLINICAL DATA:  Transaminitis. History of laparoscopic band surgery.  EXAM: US ABDOMEN LIMITED - RIGHT UPPER QUADRANT  COMPARISON:  Abdominal pelvic CT 03/12/2013.  FINDINGS: Gallbladder:  Gallstones are noted within the gallbladder neck. There is moderate gallbladder wall thickening to 7 mm. There is a small amount of pericholecystic fluid, and fluid is also present within Morison's pouch. Sonographic Percell Miller sign is positive according to the sonographer.  Common bile duct:  Diameter: 5 mm.  No evidence of choledocholithiasis.  Liver:  No focal lesion identified. Within normal limits in parenchymal  echogenicity.  IMPRESSION: 1. Cholelithiasis, gallbladder wall thickening and positive  sonographic Murphy sign worrisome for acute cholecystitis. 2. No biliary dilatation. 3. These results will be called to the ordering clinician or representative by the Radiologist Assistant, and communication documented in the PACS or zVision Dashboard.   Electronically Signed   By: Camie Patience M.D.   On: 02/21/2014 09:28         Subjective: Patient seen and examined, denies any new complaints.  Objective: Filed Vitals:   02/26/14 1540 02/26/14 2124 02/27/14 0435 02/27/14 1340  BP: 90/44 96/55 93/72  88/40  Pulse: 65 60 73 57  Temp:  98 F (36.7 C) 98 F (36.7 C) 98.5 F (36.9 C)  TempSrc:  Oral Oral Oral  Resp: 13 18 18 16   Height:      Weight:   95.391 kg (210 lb 4.8 oz)   SpO2: 94% 96% 98%     Intake/Output Summary (Last 24 hours) at 02/27/14 1522 Last data filed at 02/27/14 1514  Gross per 24 hour  Intake    700 ml  Output   2300 ml  Net  -1600 ml   Weight change: -0.227 kg (-8 oz) Exam:  Physical Exam: Head: Normocephalic, atraumatic.  Eyes: No signs of jaundice, EOMI Lungs: Normal respiratory effort. B/L Clear to auscultation, no crackles or wheezes.  Heart: Regular RR. S1 and S2 normal  Abdomen: BS normoactive. Soft, Nondistended, non-tender.  Extremities: No pretibial edema, no erythema   Data Reviewed: Basic Metabolic Panel:  Recent Labs Lab 02/22/14 1402 02/23/14 0435 02/24/14 0500 02/25/14 0455 02/27/14 0544  NA 139 144 138 141 140  K 3.6* 3.7 3.6* 3.8 3.5*  CL 98 103 99 102 103  CO2 27 29 25 26 28   GLUCOSE 112* 115* 134* 93 79  BUN 13 13 16 22 16   CREATININE 0.98 1.00 1.05 1.08 0.98  CALCIUM 9.3 9.0 8.9 8.6 8.7   Liver Function Tests:  Recent Labs Lab 02/22/14 1015 02/22/14 1402 02/24/14 0500 02/25/14 0455 02/27/14 0544  AST 65* 66* 43* 33 31  ALT 39 37* 30 26 22   ALKPHOS 162* 156* 128* 111 92  BILITOT 3.43* 3.4* 2.9* 2.8* 2.2*  PROT 5.4* 5.7* 5.5* 5.3* 5.0*  ALBUMIN 3.2* 3.4* 3.2* 3.1* 2.9*    Recent Labs Lab  02/22/14 1402  LIPASE 21   No results found for this basename: AMMONIA,  in the last 168 hours CBC:  Recent Labs Lab 02/22/14 1015 02/22/14 1402 02/23/14 0435 02/24/14 0500 02/25/14 0455 02/27/14 0544  WBC 11.4* 13.5* 7.7 7.0 5.6 3.4*  NEUTROABS 1.6 1.8  --   --   --  0.7*  HGB 8.8* 8.9* 8.2* 8.4* 7.7* 6.5*  HCT 28.2* 27.4* 25.6* 25.6* 23.3* 20.0*  MCV 92.7 90.7 91.4 90.1 90.3 91.7  PLT 51* 36* 36* 39* 39* 56*   Cardiac Enzymes: No results found for this basename: CKTOTAL, CKMB, CKMBINDEX, TROPONINI,  in the last 168 hours BNP: No components found with this basename: POCBNP,  CBG: No results found for this basename: GLUCAP,  in the last 168 hours  Recent Results (from the past 240 hour(s))  TECHNOLOGIST REVIEW     Status: None   Collection Time    02/19/14  8:44 AM      Result Value Ref Range Status   Technologist Review Variant lymphs present   Final  TECHNOLOGIST REVIEW     Status: None   Collection Time    02/22/14 10:15 AM  Result Value Ref Range Status   Technologist Review Variant lymphs present, rare myelocyte and nRBC    Final  CULTURE, BLOOD (ROUTINE X 2)     Status: None   Collection Time    02/22/14  4:03 PM      Result Value Ref Range Status   Specimen Description BLOOD RIGHT ANTECUBITAL   Final   Special Requests BOTTLES DRAWN AEROBIC AND ANAEROBIC 10 CC   Final   Culture  Setup Time     Final   Value: 02/22/2014 22:16     Performed at [facility]   Culture     Final   Value:        BLOOD CULTURE RECEIVED NO GROWTH TO DATE CULTURE WILL BE HELD FOR 5 DAYS BEFORE ISSUING A FINAL NEGATIVE REPORT     Performed at [facility]   Report Status PENDING   Incomplete  CULTURE, BLOOD (ROUTINE X 2)     Status: None   Collection Time    02/22/14  4:08 PM      Result Value Ref Range Status   Specimen Description BLOOD RIGHT ANTECUBITAL   Final   Special Requests BOTTLES DRAWN AEROBIC AND ANAEROBIC 10 CC   Final   Culture  Setup Time      Final   Value: 02/22/2014 22:15     Performed at [facility]   Culture     Final   Value:        BLOOD CULTURE RECEIVED NO GROWTH TO DATE CULTURE WILL BE HELD FOR 5 DAYS BEFORE ISSUING A FINAL NEGATIVE REPORT     Performed at [facility]   Report Status PENDING   Incomplete  CLOSTRIDIUM DIFFICILE BY PCR     Status: None   Collection Time    02/25/14 10:15 AM      Result Value Ref Range Status   C difficile by pcr NEGATIVE  NEGATIVE Final   Comment: Performed at [facility]     Scheduled Meds: . sodium chloride   Intravenous Once  . allopurinol  300 mg Oral q morning - 10a  . citalopram  20 mg Oral q morning - 10a  . feeding supplement (RESOURCE BREEZE)  1 Container Oral TID WC  . folic acid  1 mg Oral Daily  . furosemide  20 mg Oral Daily  . hydrocortisone sod succinate (SOLU-CORTEF) inj  50 mg Intravenous Daily  . levothyroxine  75 mcg Oral QAC breakfast  .  morphine injection  4 mg Intravenous Once  . oxybutynin  5 mg Oral BID  . pantoprazole  40 mg Oral BID  . piperacillin-tazobactam  3.375 g Intravenous Once  . piperacillin-tazobactam (ZOSYN)  IV  3.375 g Intravenous Q8H  . sodium chloride  3 mL Intravenous Q12H   Continuous Infusions:    Oswald Hillock, MD  [facility] Pager [phone]  If 7PM-7AM, please contact night-coverage [email or web address] Password [email or web address] 02/27/2014, 3:22 PM   LOS: 5 days

## 2014-02-27 NOTE — Progress Notes (Signed)
Progress Note   Subjective  no abdominal pain today. No diarrhea   Objective   Vital signs in last 24 hours: Temp:  [97.2 F (36.2 C)-98.1 F (36.7 C)] 98 F (36.7 C) (10/07 0435) Pulse Rate:  [52-83] 73 (10/07 0435) Resp:  [8-19] 18 (10/07 0435) BP: (87-143)/(41-79) 93/72 mmHg (10/07 0435) SpO2:  [94 %-100 %] 98 % (10/07 0435) Weight:  [210 lb 4.8 oz (95.391 kg)] 210 lb 4.8 oz (95.391 kg) (10/07 0435) Last BM Date: 02/26/14 General:    white female in NAD Heart:  Regular rate and rhythm Abdomen:  Soft, obese, nontender and nondistended. Normal bowel sounds. Extremities:  Without edema. Neurologic:  Alert and oriented,  grossly normal neurologically. Psych:  Cooperative. Normal mood and affect.  Lab Results:  Recent Labs  02/25/14 0455 02/27/14 0544  WBC 5.6 3.4*  HGB 7.7* 6.5*  HCT 23.3* 20.0*  PLT 39* 56*   BMET  Recent Labs  02/25/14 0455 02/27/14 0544  NA 141 140  K 3.8 3.5*  CL 102 103  CO2 26 28  GLUCOSE 93 79  BUN 22 16  CREATININE 1.08 0.98  CALCIUM 8.6 8.7   LFT  Recent Labs  02/27/14 0544  PROT 5.0*  ALBUMIN 2.9*  AST 31  ALT 22  ALKPHOS 92  BILITOT 2.2*   PT/INR  Recent Labs  02/27/14 0544  LABPROT 19.1*  INR 1.59*    Studies/Results: Ct Abdomen Pelvis W Contrast  02/25/2014   CLINICAL DATA:  Epigastric pain, right upper quadrant pain, diarrhea  EXAM: CT ABDOMEN AND PELVIS WITH CONTRAST  TECHNIQUE: Multidetector CT imaging of the abdomen and pelvis was performed using the standard protocol following bolus administration of intravenous contrast.  CONTRAST:  132mL OMNIPAQUE IOHEXOL 300 MG/ML  SOLN  COMPARISON:  None.  FINDINGS: The lung bases are clear.  The liver demonstrates no focal abnormality. There is no intrahepatic or extrahepatic biliary ductal dilatation. There are cholelithiasis with mild gallbladder wall thickening. The spleen demonstrates no focal abnormality. The kidneys, adrenal glands and pancreas are normal. The  bladder is unremarkable.  There is bowel wall thickening involving the transverse and descending colon. 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 There is severe gastric wall thickening with a moderate amount of fluid in the excluded portion of the stomach. There is a small left paraumbilical hernia containing small bowel and a small amount of fluid. There is anasarca. There is no bowel obstruction. There is no pneumoperitoneum, pneumatosis, or portal venous gas. There is no abdominal or pelvic free fluid. There is no lymphadenopathy.  The abdominal aorta is normal in caliber with atherosclerosis.  There are no lytic or sclerotic osseous lesions. There is diffuse lumbar spine spondylosis. 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  IMPRESSION: 1. Mild colonic wall thickening involving the transverse and descending colon most concerning for colitis secondary to infectious or inflammatory. 2. Severe gastric wall thickening with a moderate amount of fluid in the excluded portion of the stomach. This may reflect severe gastritis versus less likely gastric malignancy. Prior gastric bypass is noted without obstruction. 3. Cholelithiasis with mild gallbladder wall thickening. These findings can be seen with cholecystitis, but can also be reactive secondary to adjacent inflammatory process given the abnormality involving the stomach and colon. If there is concern regarding cholecystitis, further evaluation with HIDA scan is recommended.   Electronically Signed   By: Kathreen Devoid   On: 02/25/2014  11:15   EGD ENDOSCOPIC IMPRESSION:  1. Roux-en-Y anastomosis  2. Medium sized non-bleeding ulcer at the gastro-jejunal  anastamosis  3. The EGD was otherwise normal post Roux-en-Y  RECOMMENDATIONS:  1. Avoid NSAIDS long term  2. Continue PPI long term   Assessment / Plan:    32. 73 year old female with upper abdominal pain / elevated LFTs (on methotrexate) / cholelithiasis.  HIDA suggestive of chronic cholecystitis. Surgery evaluated but weren't convinced pain was gb related. CTscan obtained and revealed severe gastric wall thickening. For evaluation she had EGD yesterday with finding of a medium sized anastomotic ulcer (remote Roux-en-Y). Because of surgical anatomy we couldn't visualize area of stomach which was abnormal on CTscan.  Oncology wants to order CT guided biopsy of stomach for further evaluation.    2. Diarrhea: Resolved. Cdiff negative. CT scan also showing some colonic wall thickening. Diarrhea had been present for about 4 days prior to admission.    3. Leukemia, treatment on hold given acute issues and elevated LFTs. She is pancytopenic. Getting blood today, hgb 6.5     LOS: 5 days   Stacey Cooley  02/27/2014, 9:41 AM      Attending physician's note   I have taken an interval history, reviewed the chart and examined the patient. I agree with the Advanced Practitioner's note, impression and recommendations. All GI symptoms improved/resolved. Discussed patient in detail with Drs. Darnell Level late yesterday afternoon. Plan is for CT guided biopsy of the thickened gastric walls today. Defer colonoscopy. Continue PPI for anastomotic ulcer. Suspected chronic cholecystitis. Elevated LFTs maybe related to cholelithiasis/chronic cholecystitis or underlying liver disease.  Pricilla Riffle. Fuller Plan, MD [facility]

## 2014-02-27 NOTE — Progress Notes (Signed)
Report received from C. Myers, RN.  Assessment unchanged. Smitty Ackerley B  

## 2014-02-27 NOTE — Progress Notes (Signed)
Patient ordered 1 unit of blood to be transfused.  Previous RN started blood at 1340 and IV infiltrated at 1345.  After several attempts, the department RN's were unable to restart the IV successfully and blood time out had expired.  Instructed by blood bank to discard unit of blood.   When hanging the unit of blood at 1715, there was not a remaining order to document against, so second order for 1 unit placed to generate line to chart on.  Blood bank fully aware that only 1 unit was necessary and given. Stacey Cooley

## 2014-02-27 NOTE — Progress Notes (Signed)
Stacey Cooley   DOB:[birth year]   WG#:[id number]   [id number]  I have seen the patient, examined her and edited the notes as follows  Subjective: Patient has been seen and examined this morning. Abdominal pain improved. No further loose stools. Denies worsening shortness of breath or chest pain. Alert ad conversant. No other issues are reported. Denies acute bleeding problems. EGD was performed which showed gastric ulcer. For CT guided biopsy of the gastric mass.  Scheduled Meds: . sodium chloride   Intravenous Once  . allopurinol  300 mg Oral q morning - 10a  . citalopram  20 mg Oral q morning - 10a  . feeding supplement (RESOURCE BREEZE)  1 Container Oral TID WC  . folic acid  1 mg Oral Daily  . furosemide  20 mg Oral Daily  . hydrocortisone sod succinate (SOLU-CORTEF) inj  50 mg Intravenous Daily  . levothyroxine  75 mcg Oral QAC breakfast  .  morphine injection  4 mg Intravenous Once  . oxybutynin  5 mg Oral BID  . pantoprazole  40 mg Oral BID  . piperacillin-tazobactam (ZOSYN)  IV  3.375 g Intravenous 3 times per day  . sodium chloride  3 mL Intravenous Q12H   Continuous Infusions:  PRN Meds:acetaminophen, guaiFENesin-dextromethorphan, HYDROcodone-acetaminophen, morphine injection, ondansetron (ZOFRAN) IV, polyethylene glycol   Objective:  Filed Vitals:   02/27/14 0435  BP: 93/72  Pulse: 73  Temp: 98 F (36.7 C)  Resp: 18      Intake/Output Summary (Last 24 hours) at 02/27/14 0707 Last data filed at 02/27/14 0452  Gross per 24 hour  Intake    670 ml  Output   2300 ml  Net  -1630 ml    ECOG PERFORMANCE STATUS: 2  GENERAL: alert, no distress and comfortable. She is obese and less jaundiced  SKIN: skin color is pale and slightly jaundiced, texture, turgor are normal, no rashes or significant lesions  EYES: normal, Conjunctiva are pale and slightly jaundiced and non-injected  OROPHARYNX:no exudate, no erythema and lips, buccal mucosa, and tongue normal  NECK:  supple, thyroid normal size, non-tender, without nodularity  LYMPH: no palpable lymphadenopathy in the cervical, axillary or inguinal  LUNGS: clear to auscultation and percussion with normal breathing effort  HEART: regular rate & rhythm and no murmurs and 1+ bilateral lower extremity edema  ABDOMEN:abdomen soft, non tender Normal bowel sounds.   Musculoskeletal:no cyanosis of digits and no clubbing  NEURO: alert & oriented x 3 with fluent speech, no focal motor/sensory deficits    CBG (last 3)  No results found for this basename: GLUCAP,  in the last 72 hours   Labs:   Recent Labs Lab 02/22/14 1015 02/22/14 1402 02/23/14 0435 02/24/14 0500 02/25/14 0455 02/27/14 0544  WBC 11.4* 13.5* 7.7 7.0 5.6 3.4*  HGB 8.8* 8.9* 8.2* 8.4* 7.7* 6.5*  HCT 28.2* 27.4* 25.6* 25.6* 23.3* 20.0*  PLT 51* 36* 36* 39* 39* 56*  MCV 92.7 90.7 91.4 90.1 90.3 91.7  MCH 29.0 29.5 29.3 29.6 29.8 29.8  MCHC 31.2* 32.5 32.0 32.8 33.0 32.5  RDW 22.0* 20.3* 20.4* 20.2* 20.3* 20.8*  LYMPHSABS 9.4* 10.8*  --   --   --  2.1  MONOABS 0.3 0.9  --   --   --  0.6  EOSABS 0.0 0.0  --   --   --  0.0  BASOSABS 0.1 0.0  --   --   --  0.0     Chemistries:    Recent Labs  Lab 02/22/14 1015 02/22/14 1402 02/23/14 0435 02/24/14 0500 02/25/14 0455 02/27/14 0544  NA 141 139 144 138 141 140  K 3.5 3.6* 3.7 3.6* 3.8 3.5*  CL  --  98 103 99 102 103  CO2 29 27 29 25 26 28   GLUCOSE 110 112* 115* 134* 93 79  BUN 12.7 13 13 16 22 16   CREATININE 1.0 0.98 1.00 1.05 1.08 0.98  CALCIUM 9.4 9.3 9.0 8.9 8.6 8.7  AST 65* 66*  --  43* 33 31  ALT 39 37*  --  30 26 22   ALKPHOS 162* 156*  --  128* 111 92  BILITOT 3.43* 3.4*  --  2.9* 2.8* 2.2*    GFR Estimated Creatinine Clearance: 56.7 ml/min (by C-G formula based on Cr of 0.98).  Liver Function Tests:  Recent Labs Lab 02/22/14 1015 02/22/14 1402 02/24/14 0500 02/25/14 0455 02/27/14 0544  AST 65* 66* 43* 33 31  ALT 39 37* 30 26 22   ALKPHOS 162* 156* 128*  111 92  BILITOT 3.43* 3.4* 2.9* 2.8* 2.2*  PROT 5.4* 5.7* 5.5* 5.3* 5.0*  ALBUMIN 3.2* 3.4* 3.2* 3.1* 2.9*    Recent Labs Lab 02/22/14 1402  LIPASE 21      Imaging Studies:   Ct Abdomen Pelvis W Contrast  02/25/2014 CLINICAL DATA: Epigastric pain, right upper quadrant pain, diarrhea EXAM: CT ABDOMEN AND PELVIS WITH CONTRAST TECHNIQUE: Multidetector CT imaging of the abdomen and pelvis was performed using the standard protocol following bolus administration of intravenous contrast. CONTRAST: 166mL OMNIPAQUE IOHEXOL 300 MG/ML SOLN COMPARISON: None. FINDINGS: The lung bases are clear. The liver demonstrates no focal abnormality. There is no intrahepatic or extrahepatic biliary ductal dilatation. There are cholelithiasis with mild gallbladder wall thickening. The spleen demonstrates no focal abnormality. The kidneys, adrenal glands and pancreas are normal. The bladder is unremarkable. There is bowel wall thickening involving the transverse and descending colon. 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 There is severe gastric wall thickening with a moderate amount of fluid in the excluded portion of the stomach. There is a small left paraumbilical hernia containing small bowel and a small amount of fluid. There is anasarca. There is no bowel obstruction. There is no pneumoperitoneum, pneumatosis, or portal venous gas. There is no abdominal or pelvic free fluid. There is no lymphadenopathy. The abdominal aorta is normal in caliber with atherosclerosis. There are no lytic or sclerotic osseous lesions. There is diffuse lumbar spine spondylosis. 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 IMPRESSION: 1. Mild colonic wall thickening involving the transverse and descending colon most concerning for colitis secondary to infectious or inflammatory. 2. Severe gastric wall thickening with a moderate amount of fluid in the excluded portion of the stomach. This may reflect  severe gastritis versus less likely gastric malignancy. Prior gastric bypass is noted without obstruction. 3. Cholelithiasis with mild gallbladder wall thickening. These findings can be seen with cholecystitis, but can also be reactive secondary to adjacent inflammatory process given the abnormality involving the stomach and colon. If there is concern regarding cholecystitis, further evaluation with HIDA scan is recommended. Electronically Signed By: Kathreen Devoid On: 02/25/2014 11:15   Assessment/Plan: 73 y.o.  Large granular lymphocytic leukemia  Her case was extensively reviewed at the hematology tumor board. On admission, she was on 10 mg of prednisone daily Methotrexate was placed on hold this week due to elevated liver function tests.  In the scope of things, treatment was placed  on hold. Will initiate prednisone taper as out-patient.  Chronic cholecystitis   On 10/6, Dr. Alvy Bimler had an extensive discussion with General Surgery Overall, the symptoms are not consistent with severe acute cholecystitis warranting surgery.  Gastric wall thickening with abnormalities in her colon on CT scan CT abdomen pelvis on 10/5 showed CT showed gastric wall thickening, fluid in excluded gastric segment, mild colonic wall thickening and cholelithiasis. The portion that was abnormal on CT scan was not visualized on EGD. Differential diagnosis included severe gastritis due to chronic prednisone therapy versus undiagnosed malignancy Had normal colonoscopy at [facility] in 2012. No plans to repeat her colonoscopy from now. Pros and cons of attempting a CT-guided biopsy were discussed. The patient understood risk of bleeding and possibility of a nondiagnostic biopsy but she wants to proceed with this. This is to be performed tomorrow. I have arranged this with IR.  Anemia in neoplastic disease  The cause of the anemia is due to bone marrow infiltration and component of anemia of chronic disease. Infection is being ruled  out, cultures negative to date She received 1 unit of blood on admission as Hb was 8 and she was symptomatic.  No bleeding issues reported, but Hb dropped again today to  6.6 from 7.7 on 10/5. Will receive 1 unit of blood today  Transfuse her with blood if hemoglobin dropped to less than 7 g or if she is symptomatic. She needs irradiated blood products.   Thrombocytopenia  Suspect this is due to bone marrow infiltration from LGL and recent methotrexate, as well as possible liver disease causing splenomegaly.  She does not need transfusions of platelets now, as no bleeding issues are reported unless indicated by GI or Surgery. As such, she received 1 unit of platelets prior to EGD. Platelet count today is 56,000  Continue to monitor, planned for repeat platelets tomorrow prior to Biopsy  Elevated total bilirubin, pancytopenia and splenomegaly Her case was reviewed at hematology tumor board and that is a possibility that patient may have chronic liver disease causing splenomegaly.  The splenomegaly could be related to liver disease or LGL leukemia She is not a candidate to pursue for further methotrexate treatment due to elevated total bilirubin  Diarrhea, resolved This is a new symptom, Cdiff colitis is negative  Chronic diastolic dysfunction  On Lasix per primary team 02/23/2014 echo shows EF 33-29%, grade 1 diastolic dysfunction and no wall motion abnormalities.  Full Code Other medical issues as per admitting team  Discharge planning She has a very complicated case. Plan would be to proceed with CT-guided biopsy on 10/8, then follow-up result as out-patient.  **Disclaimer: This note was dictated with voice recognition software. Similar sounding words can inadvertently be transcribed and this note may contain transcription errors which may not have been corrected upon publication of note.Sharene Butters E, PA-C 02/27/2014  7:07 AM Stacey Raczkowski, MD 02/27/2014

## 2014-02-27 NOTE — Care Management Note (Addendum)
    Page 1 of 2   02/28/2014     2:38:54 PM CARE MANAGEMENT NOTE 02/28/2014  Patient:  Stacey Cooley, Stacey Cooley   Account Number:  [id number]  Date Initiated:  02/23/2014  Documentation initiated by:  [facility]  Subjective/Objective Assessment:   Abdominal pain/chronic cholecystitis     Action/Plan:   lives at home with husband   Anticipated DC Date:  02/28/2014   Anticipated DC Plan:  [place]  CM consult      Choice offered to / List presented to:             Status of service:  Completed, signed off Medicare Important Message given?  YES (If response is "NO", the following Medicare IM given date fields will be blank) Date Medicare IM given:  02/25/2014 Medicare IM given by:  [facility] Date Additional Medicare IM given:  02/28/2014 Additional Medicare IM given by:  [facility]  Discharge Disposition:  HOME/SELF CARE  Per UR Regulation:  Reviewed for med. necessity/level of care/duration of stay  If discussed at [place] of Stay Meetings, dates discussed:   02/28/2014    Comments:  02/28/14 Milderd Manocchio RN,BSN NCM [phone] 2:35P-PER PT NO NEEDS.D/C HOME NO NEEDS OR ORDERS.  AWAIT PT RECOMMENDATIONS.[facility] CHOSEN IF HHPT NEEDED.TC [facility] KRISTEN REP AWARE OF POTENTIAL REFERRAL.PRIVATE DUTY LIST GIVEN AS RESOURCE(INDEPENDANT DECISION, OUT OF POCKET COST).PATIENT/SPOUSE VOICED UNDERSTANDING.  02/27/14 Dajanae Brophy RN,BSN NCM [phone] AWAIT PT RECOMMENDATIONS.GI/SX/ONC FOLLOWING.NO SX.ULCER BX TODAY.HGB 6.5 1UPRBC.IV ABX.D/C PLAN HOME.  02/23/2014 1545 NCM spoke to pt and gave permission to speak to husband. Husband states he will possible be out of town for two weeks and wanted info on private duty agencies to hire a personal care assistance. Provided a list of private duty agencies. Pt has RW and cane at home. Guss Bunde RN CCM Case Mgmt phone [phone]

## 2014-02-27 NOTE — Consult Note (Signed)
Reason for consult:   Referring Physician(s): Dr. Alvy Bimler  History of Present Illness: Stacey Cooley is a 73 y.o. female  with a history of large granular  lymphocytic leukemia, hypertension, hypothyroidism, chronic lower extremity edema presents with 2-3 weeks of worsening epigastric and right upper quadrant pain associated with intermittent dry heaving that occurs with meals. She also stated that the pain somewhat worsened with eating. In addition, the patient was noted to have increased LFTs and increasing bilirubin over the last few weeks. The patient's oncologist, Dr. Alvy Bimler, ordered an abdominal ultrasound which was suggestive of possible cholecystitis. As a result the patient was admitted, and general surgery was consulted. HIDA scan has been performed, and it was negative for acute cholecystitis, but suggested chronic cholecystitis. CT A/P 02/25/14 revealed mild colonic wall thickening and severe gastric wall thickening with mod amt fluid in excluded portion of stomach. Of note the pt also has remote hx of Roux-en-Y/gastric bypass with EGD recently noting medium sized non bleeding ulcer at [place] anastamosis but scope could not be advanced to thickened portion of gastric wall. Request is now received from oncology for image guided biopsy of the gastric wall thickening to exclude malignancy.    Past Medical History  Diagnosis Date  . HTN (hypertension)   . Morbid obesity   . Rheumatic fever     child  . Dyslipidemia   . Swelling of joint of lower leg     recurrent lower extremity leg swelling, noted by pt  . Anemia   . Hypothyroid   . Varicose vein of leg   . MRSA (methicillin resistant staph aureus) culture positive   . TACHYCARDIA 10/17/2009    Qualifier: Diagnosis of  By: Rockey Situ MD, Tim    . Anxiety   . Cough   . Heart murmur   . Renal insufficiency 09/06/2011    DR. HA  [facility]   . Uterine cancer 1996    s/p hysterectomy; patient reported low stage; did not  need adjuvant chemo  or radiation.   . DDD (degenerative disc disease), cervical   . 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 Lymphocytosis 03/12/2013  . Other pancytopenia 03/12/2013  . Weight loss 03/12/2013  . 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 Bilateral leg edema 04/09/2013  . Nausea alone 04/09/2013  . Creatinine elevation 04/16/2013  . Renal failure, acute 05/11/2013  . Sinus congestion 07/09/2013  . Headache 07/17/2013  . Abnormal liver function tests 02/19/2014  . Cholecystitis 02/21/2014    Past Surgical History  Procedure Laterality Date  . Anterior cervical decompression and fusion  09/2009    involving C4-7 levels as well as a posterior cervical fusion prcedure from C3 to T  . Tonsillectomy  1966  . Gastric stapling  12/1984 and 2001  . Abdominal hysterectomy  1996  . Right hip replacement  1997  . Right ulnar nerve decompression  2005  . Right carpal tunnel release  2005  . Total left knee replacement  2006  . Halo application  76/22/6333    Procedure: HALO TRACTION APPLICATION;  Surgeon: Sinclair Ship, MD;  Location: [facility];  Service: Orthopedics;  Laterality: N/A;  . Joint replacement      RIGHT KNEE   . No past surgeries      RIGHT THUMB   .  Ulnar nerve repair      RIGHT ARM 1.5 YRS AGO   . Tooth extraction      OSTEO NECROSIS    IN JAW    . Posterior cervical fusion/foraminotomy N/A 08/03/2012    Procedure: POSTERIOR CERVICAL FUSION/FORAMINOTOMY LEVEL 2;  Surgeon: Sinclair Ship, MD;  Location: [facility];  Service: Orthopedics;  Laterality: N/A;  Posterior cervical fusion, cerival 1-2, cervical 2-3 with instrumentation, iliac crest autograft  . Right hip replacement    . Gastric roux-en-y    . Esophagogastroduodenoscopy N/A 02/26/2014    Procedure: ESOPHAGOGASTRODUODENOSCOPY (EGD);  Surgeon: Ladene Artist, MD;   Location: [facility];  Service: Endoscopy;  Laterality: N/A;    Allergies: Latex; Penicillins; and Sulfonamide derivatives  Medications: Prior to Admission medications   Medication Sig Start Date End Date Taking? Authorizing Provider  acetaminophen-codeine (TYLENOL #3) 300-30 MG per tablet Take 1-2 tablets by mouth 4 (four) times daily as needed (headache).  10/23/12  Yes Historical Provider, MD  allopurinol (ZYLOPRIM) 300 MG tablet Take 300 mg by mouth every morning.    Yes Historical Provider, MD  aspirin 81 MG tablet Take 81 mg by mouth daily.   Yes Historical Provider, MD  butalbital-acetaminophen-caffeine (FIORICET, ESGIC) 50-325-40 MG per tablet Take 1-2 tablets by mouth every 6 (six) hours as needed for headache (headache). 01/21/14  Yes Heath Lark, MD  Calcium Carbonate-Vitamin D (CALCIUM 600+D) 600-400 MG-UNIT per tablet Take 1 tablet by mouth daily.   Yes Historical Provider, MD  cholecalciferol (VITAMIN D) 1000 UNITS tablet Take 1,000 Units by mouth 2 (two) times daily.   Yes Historical Provider, MD  citalopram (CELEXA) 20 MG tablet Take 20 mg by mouth every morning.    Yes Historical Provider, MD  Cyanocobalamin (VITAMIN B-12 SL) Place 1 tablet under the tongue daily.   Yes Historical Provider, MD  fluticasone (FLONASE) 50 MCG/ACT nasal spray Place 3 sprays into the nose daily.   Yes Historical Provider, MD  folic acid (FOLVITE) 1 MG tablet Take 1 tablet (1 mg total) by mouth daily. 10/10/13  Yes Heath Lark, MD  furosemide (LASIX) 40 MG tablet Take 20 mg by mouth daily. 10/10/13  Yes Heath Lark, MD  levothyroxine (SYNTHROID, LEVOTHROID) 75 MCG tablet Take 75 mcg by mouth daily before breakfast.    Yes Historical Provider, MD  methotrexate (RHEUMATREX) 2.5 MG tablet Take 4 tablets (10 mg total) by mouth once a week. Caution:Chemotherapy. Protect from light. 01/21/14  Yes Heath Lark, MD  metoprolol tartrate (LOPRESSOR) 25 MG tablet Take 25 mg by mouth 2 (two) times daily.    Yes  Historical Provider, MD  Multiple Vitamin (MULTIVITAMIN WITH MINERALS) TABS Take 1 tablet by mouth daily. Centrum   Yes Historical Provider, MD  ondansetron (ZOFRAN) 8 MG tablet Take 8 mg by mouth every 8 (eight) hours as needed for nausea or vomiting (nausea).   Yes Historical Provider, MD  oxybutynin (DITROPAN) 5 MG tablet Take 5 mg by mouth Daily. 07/28/11  Yes Historical Provider, MD  Potassium 99 MG TABS Take 1 tablet by mouth daily.    Yes Historical Provider, MD  predniSONE (DELTASONE) 5 MG tablet Take 5 mg by mouth 2 (two) times daily with a meal.  09/10/13  Yes Heath Lark, MD    Family History  Problem Relation Age of Onset  . Coronary artery disease Neg Hx   . Cancer Mother     lung cancer  . Liver disease Father   . Anemia Sister  History   Social History  . Marital Status: Married    Spouse Name: N/A    Number of Children: N/A  . Years of Education: N/A   Social History Main Topics  . Smoking status: Former Smoker -- 0.25 packs/day for 10 years    Quit date: 05/24/1968  . Smokeless tobacco: Never Used     Comment: no smoking  . Alcohol Use: No  . Drug Use: No  . Sexual Activity: No   Other Topics Concern  . None   Social History Narrative   Lives with husband.          Review of Systems see above  Vital Signs: BP 88/40  Pulse 57  Temp(Src) 98.5 F (36.9 C) (Oral)  Resp 16  Ht 5\' 2"  (1.575 m)  Wt 210 lb 4.8 oz (95.391 kg)  BMI 38.45 kg/m2  SpO2 98%  Physical Exam pt awake/alert; chest- CTA bilat ant; heart- RRR; abd- obese,+BS,mild tenderness epig region, abd wall hernia; ext- 2+ LE edema  Imaging: Dg Chest 2 View  02/22/2014   CLINICAL DATA:  Preoperative exam prior to cholecystectomy; cough  EXAM: CHEST  2 VIEW  COMPARISON:  PA and lateral chest x-ray of August 02, 2012  FINDINGS: The lungs are adequately inflated. The interstitial markings are mildly increased bilaterally. The cardiopericardial silhouette is enlarged and the central  pulmonary vascularity is engorged. There is tortuosity of the descending thoracic aorta. The bony thorax exhibits no acute abnormalities. The patient has undergone previous cervical and upper thoracic fusion.  IMPRESSION: There is low grade CHF with mild interstitial edema. There is no pleural effusion nor evidence of pneumonia.   Electronically Signed   By: David  Martinique   On: 02/22/2014 15:21   Nm Hepatobiliary  02/23/2014   CLINICAL DATA:  Abnormal liver function studies. Abdominal pain in the right upper quadrant with nausea for 3 weeks.  EXAM: NUCLEAR MEDICINE HEPATOBILIARY IMAGING  TECHNIQUE: Sequential images of the abdomen were obtained out to 60 minutes following intravenous administration of radiopharmaceutical.  RADIOPHARMACEUTICALS:  5.5 [employer] [id number] Choletec  COMPARISON:  Ultrasound abdomen 02/21/2014.  FINDINGS: There is normal homogeneous uptake and distribution of activity throughout the liver with normal washout and prompt appearance of activity in the intrahepatic and extrahepatic bile ducts and small bowel. There is delayed appearance of activity in the gallbladder, seen only at 3-4 hr after injection. Findings are compatible with chronic cholecystitis. Acute cholecystitis can sometimes demonstrate delayed uptake as well. No evidence of biliary obstruction.  IMPRESSION: Delayed appearance of tracer activity in the gallbladder most consistent with chronic cholecystitis. No evidence of biliary obstruction.   Electronically Signed   By: Lucienne Capers M.D.   On: 02/23/2014 04:30   Ct Abdomen Pelvis W Contrast  02/25/2014   CLINICAL DATA:  Epigastric pain, right upper quadrant pain, diarrhea  EXAM: CT ABDOMEN AND PELVIS WITH CONTRAST  TECHNIQUE: Multidetector CT imaging of the abdomen and pelvis was performed using the standard protocol following bolus administration of intravenous contrast.  CONTRAST:  185mL OMNIPAQUE IOHEXOL 300 MG/ML  SOLN  COMPARISON:  None.  FINDINGS: The lung bases  are clear.  The liver demonstrates no focal abnormality. There is no intrahepatic or extrahepatic biliary ductal dilatation. There are cholelithiasis with mild gallbladder wall thickening. The spleen demonstrates no focal abnormality. The kidneys, adrenal glands and pancreas are normal. The bladder is unremarkable.  There is bowel wall thickening involving the transverse and descending colon. There is evidence of prior gastric  bypass. There is severe gastric wall thickening with a moderate amount of fluid in the excluded portion of the stomach. There is a small left paraumbilical hernia containing small bowel and a small amount of fluid. There is anasarca. There is no bowel obstruction. There is no pneumoperitoneum, pneumatosis, or portal venous gas. There is no abdominal or pelvic free fluid. There is no lymphadenopathy.  The abdominal aorta is normal in caliber with atherosclerosis.  There are no lytic or sclerotic osseous lesions. There is diffuse lumbar spine spondylosis. 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  IMPRESSION: 1. Mild colonic wall thickening involving the transverse and descending colon most concerning for colitis secondary to infectious or inflammatory. 2. Severe gastric wall thickening with a moderate amount of fluid in the excluded portion of the stomach. This may reflect severe gastritis versus less likely gastric malignancy. Prior gastric bypass is noted without obstruction. 3. Cholelithiasis with mild gallbladder wall thickening. These findings can be seen with cholecystitis, but can also be reactive secondary to adjacent inflammatory process given the abnormality involving the stomach and colon. If there is concern regarding cholecystitis, further evaluation with HIDA scan is recommended.   Electronically Signed   By: Kathreen Devoid   On: 02/25/2014 11:15   US Abdomen Limited  02/21/2014   CLINICAL DATA:  Transaminitis. History of laparoscopic  band surgery.  EXAM: US ABDOMEN LIMITED - RIGHT UPPER QUADRANT  COMPARISON:  Abdominal pelvic CT 03/12/2013.  FINDINGS: Gallbladder:  Gallstones are noted within the gallbladder neck. There is moderate gallbladder wall thickening to 7 mm. There is a small amount of pericholecystic fluid, and fluid is also present within Morison's pouch. Sonographic Percell Miller sign is positive according to the sonographer.  Common bile duct:  Diameter: 5 mm.  No evidence of choledocholithiasis.  Liver:  No focal lesion identified. Within normal limits in parenchymal echogenicity.  IMPRESSION: 1. Cholelithiasis, gallbladder wall thickening and positive sonographic Murphy sign worrisome for acute cholecystitis. 2. No biliary dilatation. 3. These results will be called to the ordering clinician or representative by the Radiologist Assistant, and communication documented in the PACS or zVision Dashboard.   Electronically Signed   By: Camie Patience M.D.   On: 02/21/2014 09:28    Labs:  CBC:  Recent Labs  02/23/14 0435 02/24/14 0500 02/25/14 0455 02/27/14 0544  WBC 7.7 7.0 5.6 3.4*  HGB 8.2* 8.4* 7.7* 6.5*  HCT 25.6* 25.6* 23.3* 20.0*  PLT 36* 39* 39* 56*    COAGS:  Recent Labs  03/23/13 0910 02/27/14 0544  INR 1.07 1.59*  APTT 27 32    BMP:  Recent Labs  02/23/14 0435 02/24/14 0500 02/25/14 0455 02/27/14 0544  NA 144 138 141 140  K 3.7 3.6* 3.8 3.5*  CL 103 99 102 103  CO2 29 25 26 28   GLUCOSE 115* 134* 93 79  BUN 13 16 22 16   CALCIUM 9.0 8.9 8.6 8.7  CREATININE 1.00 1.05 1.08 0.98  GFRNONAA 55* 52* 50* 57*  GFRAA 64* 60* 58* 66*    LIVER FUNCTION TESTS:  Recent Labs  02/22/14 1402 02/24/14 0500 02/25/14 0455 02/27/14 0544  BILITOT 3.4* 2.9* 2.8* 2.2*  AST 66* 43* 33 31  ALT 37* 30 26 22   ALKPHOS 156* 128* 111 92  PROT 5.7* 5.5* 5.3* 5.0*  ALBUMIN 3.4* 3.2* 3.1* 2.9*    TUMOR MARKERS: No results found for this basename: AFPTM, CEA, CA199, CHROMGRNA,  in the last  8760  hours  Assessment and Plan: Pt with hx large granular lymphocytic leukemia, upper abd discomfort, prev Roux-en-Y/gastric bypass, pancytopenia, and CT revealing severe gastric wall thickening. Imaging studies were reviewed by Dr. Laurence Ferrari. Plan is for CT/US guided biopsy of the gastric wall thickening on 10/8. Details/risks of procedure d/w pt with her understanding and consent. Pt will receive PRBC transfusion tonight and plts in am.          I spent a total of 20 minutes face to face in clinical consultation, greater than 50% of which was counseling/coordinating care for gastric wall thickening thickening biopsy.   Signed: Autumn Messing 02/27/2014, 4:21 PM

## 2014-02-27 NOTE — Progress Notes (Signed)
Patient ID: Stacey Cooley, female   DOB: 1941/03/28, 73 y.o.   MRN: [id number]     [address] [place], [place] [address]    Phone: [phone] FAX: [phone]     Subjective: hgb 6.5. PLT 56K.  Pain is a bit better, epigastric. No n/v.  Bili down to 2.2.    Objective:  Vital signs:  Filed Vitals:   02/26/14 1530 02/26/14 1540 02/26/14 2124 02/27/14 0435  BP: 93/41 90/44 96/55  93/72  Pulse: 62 65 60 73  Temp:   98 F (36.7 C) 98 F (36.7 C)  TempSrc:   Oral Oral  Resp: 12 13 18 18   Height:      Weight:    210 lb 4.8 oz (95.391 kg)  SpO2: 95% 94% 96% 98%    Last BM Date: 02/26/14  Intake/Output   Yesterday:  10/06 0701 - 10/07 0700 In: 670 [Blood:570; IV Piggyback:100] Out: 2300 [Urine:2300] This shift:    I/O last 3 completed shifts: In: 83 [Blood:570; IV Piggyback:250] Out: 3050 [Urine:2900; Stool:150]    Physical Exam: General: Pt awake/alert/oriented x4 in no acute distress Abdomen: Soft.  Nondistended.  Non tender. Umbilical hernia, non tender.  No evidence of peritonitis.     Problem List:   Principal Problem:   Cholecystitis Active Problems:   Transaminitis   Obesity   Large granular lymphocytic leukemia   Anemia in neoplastic disease   Abnormal liver function tests   Acute cholecystitis   Chronic cholecystitis   Cholelithiasis   Abnormal CT scan, stomach   Abnormal CT scan, colon   Thrombocytopenia    Results:   Labs: Results for orders placed during the hospital encounter of 02/22/14 (from the past 48 hour(s))  PREPARE PLATELET PHERESIS     Status: None   Collection Time    02/26/14  8:00 AM      Result Value Ref Range   Unit Number [id number]     Blood Component Type PLTPHER LR1     Unit division 00     Status of Unit ISSUED,FINAL     Transfusion Status OK TO TRANSFUSE    CBC WITH DIFFERENTIAL     Status: Abnormal   Collection Time    02/27/14  5:44 AM     Result Value Ref Range   WBC 3.4 (*) 4.0 - 10.5 K/uL   Comment: WHITE COUNT CONFIRMED ON SMEAR   RBC 2.18 (*) 3.87 - 5.11 MIL/uL   Hemoglobin 6.5 (*) 12.0 - 15.0 g/dL   Comment: REPEATED TO VERIFY     CRITICAL RESULT CALLED TO, READ BACK BY AND VERIFIED WITH:     C. PELITIER RN AT 3403 ON 10.7.15 BY SHUEA   HCT 20.0 (*) 36.0 - 46.0 %   MCV 91.7  78.0 - 100.0 fL   MCH 29.8  26.0 - 34.0 pg   MCHC 32.5  30.0 - 36.0 g/dL   RDW 20.8 (*) 11.5 - 15.5 %   Platelets 56 (*) 150 - 400 K/uL   Comment: SPECIMEN CHECKED FOR CLOTS     REPEATED TO VERIFY     PLATELET COUNT CONFIRMED BY SMEAR   Neutrophils Relative % 21 (*) 43 - 77 %   Lymphocytes Relative 61 (*) 12 - 46 %   Monocytes Relative 18 (*) 3 - 12 %   Eosinophils Relative 0  0 - 5 %   Basophils Relative 0  0 - 1 %   Neutro Abs 0.7 (*) 1.7 - 7.7 K/uL   Lymphs Abs 2.1  0.7 - 4.0 K/uL   Monocytes Absolute 0.6  0.1 - 1.0 K/uL   Eosinophils Absolute 0.0  0.0 - 0.7 K/uL   Basophils Absolute 0.0  0.0 - 0.1 K/uL   RBC Morphology POLYCHROMASIA PRESENT     WBC Morphology ATYPICAL LYMPHOCYTES    APTT     Status: None   Collection Time    02/27/14  5:44 AM      Result Value Ref Range   aPTT 32  24 - 37 seconds  PROTIME-INR     Status: Abnormal   Collection Time    02/27/14  5:44 AM      Result Value Ref Range   Prothrombin Time 19.1 (*) 11.6 - 15.2 seconds   INR 1.59 (*) 0.00 - 1.49  COMPREHENSIVE METABOLIC PANEL     Status: Abnormal   Collection Time    02/27/14  5:44 AM      Result Value Ref Range   Sodium 140  137 - 147 mEq/L   Potassium 3.5 (*) 3.7 - 5.3 mEq/L   Chloride 103  96 - 112 mEq/L   CO2 28  19 - 32 mEq/L   Glucose, Bld 79  70 - 99 mg/dL   BUN 16  6 - 23 mg/dL   Creatinine, Ser 0.98  0.50 - 1.10 mg/dL   Calcium 8.7  8.4 - 10.5 mg/dL   Total Protein 5.0 (*) 6.0 - 8.3 g/dL   Albumin 2.9 (*) 3.5 - 5.2 g/dL   AST 31  0 - 37 U/L   ALT 22  0 - 35 U/L   Alkaline Phosphatase 92  39 - 117 U/L   Total Bilirubin 2.2 (*) 0.3  - 1.2 mg/dL   GFR calc non Af Amer 57 (*) >90 mL/min   GFR calc Af Amer 66 (*) >90 mL/min   Comment: (NOTE)     The eGFR has been calculated using the CKD EPI equation.     This calculation has not been validated in all clinical situations.     eGFR's persistently <90 mL/min signify possible Chronic Kidney     Disease.   Anion gap 9  5 - 15  PREPARE RBC (CROSSMATCH)     Status: None   Collection Time    02/27/14  7:00 AM      Result Value Ref Range   Order Confirmation ORDER PROCESSED BY BLOOD BANK    TYPE AND SCREEN     Status: None   Collection Time    02/27/14  8:45 AM      Result Value Ref Range   ABO/RH(D) O NEG     Antibody Screen NEG     Sample Expiration 03/02/2014     Unit Number [id number]     Blood Component Type RBC, LR IRR     Unit division 00     Status of Unit ALLOCATED     Transfusion Status OK TO TRANSFUSE     Crossmatch Result Compatible      Imaging / Studies: No results found.  Medications / Allergies:  Scheduled Meds: . sodium chloride   Intravenous Once  . allopurinol  300 mg Oral q morning - 10a  . citalopram  20 mg Oral q morning - 10a  . feeding supplement (RESOURCE BREEZE)  1 Container Oral TID WC  . folic acid  1 mg Oral Daily  .  furosemide  20 mg Oral Daily  . hydrocortisone sod succinate (SOLU-CORTEF) inj  50 mg Intravenous Daily  . levothyroxine  75 mcg Oral QAC breakfast  .  morphine injection  4 mg Intravenous Once  . oxybutynin  5 mg Oral BID  . pantoprazole  40 mg Oral BID  . piperacillin-tazobactam (ZOSYN)  IV  3.375 g Intravenous 3 times per day  . sodium chloride  3 mL Intravenous Q12H   Continuous Infusions:  PRN Meds:.acetaminophen, guaiFENesin-dextromethorphan, HYDROcodone-acetaminophen, morphine injection, ondansetron (ZOFRAN) IV, polyethylene glycol  Antibiotics: Anti-infectives   Start     Dose/Rate Route Frequency Ordered Stop   02/22/14 2200  piperacillin-tazobactam (ZOSYN) IVPB 3.375 g     3.375 g 12.5 mL/hr over  240 Minutes Intravenous 3 times per day 02/22/14 1549     02/22/14 1600  piperacillin-tazobactam (ZOSYN) IVPB 3.375 g     3.375 g 100 mL/hr over 30 Minutes Intravenous STAT 02/22/14 1547 02/22/14 1736        Assessment/Plan Gall bladder disease/cholelithiasis  transaminitis  -HIDA scan 10/3 shows slow gall bladder filling, this may suggest chronic cholecystitis -on zosyn -LFTs elevated since 7/15, bilirubin down to 2.2 -no surgical plans at this time Gastric wall thickening(excluded from view by endo) -CT guided biopsy today Marginal ulcer of RYGB -EGD 10/6 - Dr. Fuller Plan -on PPI Mild colonic wall thickening of transverse and descending colon -c diff negative  Abdominal wall hernia Present on CT scan on 03/16/2014 This is unchanged on PE and CT scan 02/25/2014 Large granular lymphocytic leukemia -Ni Gorsuch oncologist Anemia--hgb 6.5 today -transfusing 1 unit of PRBCs per onc Thrombocytopenia--better at [place], [facility] Surgery Pager [phone](7A-4:30P)  02/27/2014 12:06 PM  Agree with above. Looks good.  Her husband was not in the room at the time.  He is usually there. Plans are to do IR stomach biopsy.  Alphonsa Overall, MD, [facility] Surgery Pager: [phone] Office phone:  [phone]

## 2014-02-27 NOTE — Progress Notes (Signed)
Nutrition Follow Up  INTERVENTION: -CL diet with advancement per MD. -Discontinue Resource Breeze -Unjury chicken broth tid (100 kcal, 21 gm protein each)  NUTRITION DIAGNOSIS: Inadequate oral intake related to diarrhea as evidenced by clear liquid diet.   Goal: Patient will meet >/=90% of estimated nutrition needs  Monitor:  Diet advancement, bowel function, PO intake, weight, labs  Reason for Assessment: Malnutrition screening tool  73 y.o. female  Admitting Dx: Cholecystitis  ASSESSMENT: 73 y.o. 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 She was experiencing some epigastric/right upper quadrant pain worse by eating food better with bowel rest and defecation, Right upper quadrant ultrasound showed evidence of cholecystitis.   Patient reports that she has had a decrease in appetite for the last few months. She has lost about 23 pounds in the last year likely secondary to leukemia and history of Roux-en-Y bypass. She is currently eating 100% of clear liquid diet, but complains of diarrhea. No fat or muscle depletion noted.   10/7: -complains of diarrhea after clear liquids (dumping syndrome), suggested choosing lower sugar choices   Height: Ht Readings from Last 1 Encounters:  02/22/14 5\' 2"  (1.575 m)    Weight: Wt Readings from Last 1 Encounters:  02/27/14 210 lb 4.8 oz (95.391 kg)    Ideal Body Weight: 110 pounds  % Ideal Body Weight: 188%  Wt Readings from Last 10 Encounters:  02/27/14 210 lb 4.8 oz (95.391 kg)  02/27/14 210 lb 4.8 oz (95.391 kg)  02/27/14 210 lb 4.8 oz (95.391 kg)  02/27/14 210 lb 4.8 oz (95.391 kg)  02/22/14 211 lb 4.8 oz (95.845 kg)  02/12/14 204 lb 11.2 oz (92.851 kg)  01/21/14  215 lb (97.523 kg)  12/18/13 210 lb 6.4 oz (95.437 kg)  11/13/13 216 lb 3.2 oz (98.068 kg)  10/10/13 221 lb 12.8 oz (100.608 kg)    Usual Body Weight: 230 pounds  % Usual Body Weight: 90%  BMI:  Body mass index is 38.45 kg/(m^2). Patient is obese, class I  Estimated Nutritional Needs: Kcal: 1950-2100 kcal Protein: 110-120 g Fluid: >2.1 L/day  Skin: Intact  Diet Order: Clear Liquid  EDUCATION NEEDS: -No education needs identified at this time   Intake/Output Summary (Last 24 hours) at 02/27/14 1720 Last data filed at 02/27/14 1514  Gross per 24 hour  Intake    130 ml  Output   2300 ml  Net  -2170 ml    Last BM: 10/4   Labs:   Recent Labs Lab 02/24/14 0500 02/25/14 0455 02/27/14 0544  NA 138 141 140  K 3.6* 3.8 3.5*  CL 99 102 103  CO2 25 26 28   BUN 16 22 16   CREATININE 1.05 1.08 0.98  CALCIUM 8.9 8.6 8.7  GLUCOSE 134* 93 79    CBG (last 3)  No results found for this basename: GLUCAP,  in the last 72 hours  Scheduled Meds: . sodium chloride   Intravenous Once  . allopurinol  300 mg Oral q morning - 10a  . citalopram  20 mg Oral q morning - 44W  . folic acid  1 mg Oral Daily  . hydrocortisone sod succinate (SOLU-CORTEF) inj  50 mg Intravenous Daily  . levothyroxine  75 mcg Oral QAC breakfast  .  morphine injection  4 mg Intravenous Once  .  oxybutynin  5 mg Oral BID  . pantoprazole  40 mg Oral BID  . piperacillin-tazobactam (ZOSYN)  IV  3.375 g Intravenous Q8H  . protein supplement  8 oz Oral TID  . sodium chloride  3 mL Intravenous Q12H    Continuous Infusions:   Past Medical History  Diagnosis Date  . HTN (hypertension)   . Morbid obesity   . Rheumatic fever     child  . Dyslipidemia   . Swelling of joint of lower leg     recurrent lower extremity leg swelling, noted by pt  . Anemia   . Hypothyroid   . Varicose vein of leg   . MRSA (methicillin resistant staph aureus) culture positive   . TACHYCARDIA 10/17/2009    Qualifier:  Diagnosis of  By: Rockey Situ MD, Tim    . Anxiety   . Cough   . Heart murmur   . Renal insufficiency 09/06/2011    DR. HA  [facility]   . Uterine cancer 1996    s/p hysterectomy; patient reported low stage; did not need adjuvant chemo  or radiation.   . DDD (degenerative disc disease), cervical   . 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 Lymphocytosis 03/12/2013  . Other pancytopenia 03/12/2013  . Weight loss 03/12/2013  . 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 Bilateral leg edema 04/09/2013  . Nausea alone 04/09/2013  . Creatinine elevation 04/16/2013  . Renal failure, acute 05/11/2013  . Sinus congestion 07/09/2013  . Headache 07/17/2013  . Abnormal liver function tests 02/19/2014  . Cholecystitis 02/21/2014    Past Surgical History  Procedure Laterality Date  . Anterior cervical decompression and fusion  09/2009    involving C4-7 levels as well as a posterior cervical fusion prcedure from C3 to T  . Tonsillectomy  1966  . Gastric stapling  12/1984 and 2001  . Abdominal hysterectomy  1996  . Right hip replacement  1997  . Right ulnar nerve decompression  2005  . Right carpal tunnel release  2005  . Total left knee replacement  2006  . Halo application  68/34/1962    Procedure: HALO TRACTION APPLICATION;  Surgeon: Sinclair Ship, MD;  Location: [facility];  Service: Orthopedics;  Laterality: N/A;  . Joint replacement      RIGHT KNEE   . No past surgeries      RIGHT THUMB   . Ulnar nerve repair      RIGHT ARM 1.5 YRS AGO   . Tooth extraction      OSTEO NECROSIS    IN JAW    . Posterior cervical fusion/foraminotomy N/A 08/03/2012    Procedure: POSTERIOR CERVICAL FUSION/FORAMINOTOMY LEVEL 2;  Surgeon: Sinclair Ship, MD;  Location: [facility];  Service: Orthopedics;  Laterality: N/A;  Posterior cervical fusion, cerival 1-2, cervical  2-3 with instrumentation, iliac crest autograft  . Right hip replacement    . Gastric roux-en-y    . Esophagogastroduodenoscopy N/A 02/26/2014    Procedure: ESOPHAGOGASTRODUODENOSCOPY (EGD);  Surgeon: Ladene Artist, MD;  Location: [facility];  Service: Endoscopy;  Laterality: N/A;    Antonieta Iba, RD, LDN Clinical Inpatient Dietitian Pager:  [phone] Weekend and after hours pager:  [phone]

## 2014-02-27 NOTE — Progress Notes (Signed)
ANTIBIOTIC CONSULT NOTE - FOLLOW-UP  Pharmacy Consult for Zosyn Indication: intra-abdominal infection  Assessment: 77 yoF with history of large granular lymphocytic leukemia on weekly methotrexate and BID prednisone, admitted with epigastric and RUQ pain.  Ultrasound confirms gallstone disease, known since 2014.  Appears to be chronic rather than acute cholecystitis; per surgery plans are conservative mgmt with abx for now. CT shows gastric and colonic wall thickening in addition to cholelithiasis. Follow-up EGD reveals nonbleeding ulcer at gastro-jejunal junction.  Noted allergy of rash to penicillins. Continuing with order for Zosyn since patient appears to have tolerated other beta lactams during previous encounters. Spoke with RN to monitor for rash.  10/2 >> Zosyn >>   Tmax: afebrile since admission WBC: trending down since admit, now slightly low at 3.4 (may be related to weekly MTX) Renal: SCr stable at 1; CrCl 57 ml/min normalized  10/2 blood: NGTD 2/2 10/5 Cdiff PCR: negative  Goal of Therapy:  Doses adjusted per renal function Eradication of infection   Plan:   Continue Zosyn 3.375g IV q8h (4 hour infusion time)  Follow cultures, clinical course, and SCr  Follow-up length of Abx therapy    Allergies  Allergen Reactions  . Latex Itching and Rash  . Penicillins Rash  . Sulfonamide Derivatives Rash    Patient Measurements: Height: 5\' 2"  (157.5 cm) Weight: 210 lb 4.8 oz (95.391 kg) IBW/kg (Calculated) : 50.1  Vital Signs: Temp: 98 F (36.7 C) (10/07 0435) Temp Source: Oral (10/07 0435) BP: 93/72 mmHg (10/07 0435) Pulse Rate: 73 (10/07 0435) Intake/Output from previous day: 10/06 0701 - 10/07 0700 In: 670 [Blood:570; IV Piggyback:100] Out: 2300 [Urine:2300] Intake/Output from this shift:    Labs:  Recent Labs  02/25/14 0455 02/27/14 0544  WBC 5.6 3.4*  HGB 7.7* 6.5*  PLT 39* 56*  CREATININE 1.08 0.98   Estimated Creatinine Clearance: 56.7  ml/min (by C-G formula based on Cr of 0.98). No results found for this basename: VANCOTROUGH, Corlis Leak, VANCORANDOM, [place], GENTPEAK, [place], [place], [place], TOBRARND, AMIKACINPEAK, AMIKACINTROU, AMIKACIN,  in the last 72 hours   Microbiology: Recent Results (from the past 720 hour(s))  TECHNOLOGIST REVIEW     Status: None   Collection Time    02/12/14  8:04 AM      Result Value Ref Range Status   Technologist Review Variant lymphs present   Final  TECHNOLOGIST REVIEW     Status: None   Collection Time    02/19/14  8:44 AM      Result Value Ref Range Status   Technologist Review Variant lymphs present   Final  TECHNOLOGIST REVIEW     Status: None   Collection Time    02/22/14 10:15 AM      Result Value Ref Range Status   Technologist Review Variant lymphs present, rare myelocyte and nRBC    Final  CULTURE, BLOOD (ROUTINE X 2)     Status: None   Collection Time    02/22/14  4:03 PM      Result Value Ref Range Status   Specimen Description BLOOD RIGHT ANTECUBITAL   Final   Special Requests BOTTLES DRAWN AEROBIC AND ANAEROBIC 10 CC   Final   Culture  Setup Time     Final   Value: 02/22/2014 22:16     Performed at [facility]   Culture     Final   Value:        BLOOD CULTURE RECEIVED NO GROWTH TO DATE CULTURE WILL BE HELD  FOR 5 DAYS BEFORE ISSUING A FINAL NEGATIVE REPORT     Performed at [facility]   Report Status PENDING   Incomplete  CULTURE, BLOOD (ROUTINE X 2)     Status: None   Collection Time    02/22/14  4:08 PM      Result Value Ref Range Status   Specimen Description BLOOD RIGHT ANTECUBITAL   Final   Special Requests BOTTLES DRAWN AEROBIC AND ANAEROBIC 10 CC   Final   Culture  Setup Time     Final   Value: 02/22/2014 22:15     Performed at [facility]   Culture     Final   Value:        BLOOD CULTURE RECEIVED NO GROWTH TO DATE CULTURE WILL BE HELD FOR 5 DAYS BEFORE ISSUING A FINAL NEGATIVE REPORT     Performed at [facility]   Report Status PENDING   Incomplete  CLOSTRIDIUM DIFFICILE BY PCR     Status: None   Collection Time    02/25/14 10:15 AM      Result Value Ref Range Status   C difficile by pcr NEGATIVE  NEGATIVE Final   Comment: Performed at [facility] History: Past Medical History  Diagnosis Date  . HTN (hypertension)   . Morbid obesity   . Rheumatic fever     child  . Dyslipidemia   . Swelling of joint of lower leg     recurrent lower extremity leg swelling, noted by pt  . Anemia   . Hypothyroid   . Varicose vein of leg   . MRSA (methicillin resistant staph aureus) culture positive   . TACHYCARDIA 10/17/2009    Qualifier: Diagnosis of  By: Rockey Situ MD, Tim    . Anxiety   . Cough   . Heart murmur   . Renal insufficiency 09/06/2011    DR. HA  [facility]   . Uterine cancer 1996    s/p hysterectomy; patient reported low stage; did not need adjuvant chemo  or radiation.   . DDD (degenerative disc disease), cervical   . 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 Lymphocytosis 03/12/2013  . Other pancytopenia 03/12/2013  . Weight loss 03/12/2013  . 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 Bilateral leg edema 04/09/2013  . Nausea alone 04/09/2013  . Creatinine elevation 04/16/2013  . Renal failure, acute 05/11/2013  . Sinus congestion 07/09/2013  . Headache 07/17/2013  . Abnormal liver function tests 02/19/2014  . Cholecystitis 02/21/2014    Reuel Boom, PharmD Pager: [phone] 02/27/2014, 7:41 AM

## 2014-02-28 ENCOUNTER — Other Ambulatory Visit: Payer: Self-pay | Admitting: Hematology and Oncology

## 2014-02-28 ENCOUNTER — Telehealth: Payer: Self-pay | Admitting: Hematology and Oncology

## 2014-02-28 ENCOUNTER — Encounter: Payer: Self-pay | Admitting: Hematology and Oncology

## 2014-02-28 ENCOUNTER — Other Ambulatory Visit: Payer: Self-pay | Admitting: *Deleted

## 2014-02-28 ENCOUNTER — Encounter (HOSPITAL_COMMUNITY): Payer: Self-pay | Admitting: Radiology

## 2014-02-28 ENCOUNTER — Inpatient Hospital Stay (HOSPITAL_COMMUNITY): Payer: Medicare Other

## 2014-02-28 DIAGNOSIS — M1990 Unspecified osteoarthritis, unspecified site: Secondary | ICD-10-CM

## 2014-02-28 DIAGNOSIS — K319 Disease of stomach and duodenum, unspecified: Secondary | ICD-10-CM

## 2014-02-28 DIAGNOSIS — D649 Anemia, unspecified: Secondary | ICD-10-CM

## 2014-02-28 DIAGNOSIS — D709 Neutropenia, unspecified: Secondary | ICD-10-CM

## 2014-02-28 HISTORY — DX: Unspecified osteoarthritis, unspecified site: M19.90

## 2014-02-28 LAB — CBC
HEMATOCRIT: 21.7 % — AB (ref 36.0–46.0)
HEMOGLOBIN: 7.1 g/dL — AB (ref 12.0–15.0)
MCH: 29.5 pg (ref 26.0–34.0)
MCHC: 32.7 g/dL (ref 30.0–36.0)
MCV: 90 fL (ref 78.0–100.0)
Platelets: 57 10*3/uL — ABNORMAL LOW (ref 150–400)
RBC: 2.41 MIL/uL — AB (ref 3.87–5.11)
RDW: 21 % — ABNORMAL HIGH (ref 11.5–15.5)
WBC: 3.6 10*3/uL — ABNORMAL LOW (ref 4.0–10.5)

## 2014-02-28 LAB — CULTURE, BLOOD (ROUTINE X 2)
CULTURE: NO GROWTH
Culture: NO GROWTH

## 2014-02-28 LAB — BASIC METABOLIC PANEL
Anion gap: 10 (ref 5–15)
BUN: 18 mg/dL (ref 6–23)
CHLORIDE: 105 meq/L (ref 96–112)
CO2: 27 mEq/L (ref 19–32)
Calcium: 8.7 mg/dL (ref 8.4–10.5)
Creatinine, Ser: 0.98 mg/dL (ref 0.50–1.10)
GFR calc Af Amer: 66 mL/min — ABNORMAL LOW (ref >90)
GFR calc non Af Amer: 57 mL/min — ABNORMAL LOW (ref >90)
GLUCOSE: 86 mg/dL (ref 70–99)
POTASSIUM: 3.3 meq/L — AB (ref 3.7–5.3)
SODIUM: 142 meq/L (ref 137–147)

## 2014-02-28 LAB — PREPARE RBC (CROSSMATCH)

## 2014-02-28 LAB — PROTIME-INR
INR: 1.69 — ABNORMAL HIGH (ref 0.00–1.49)
PROTHROMBIN TIME: 20 s — AB (ref 11.6–15.2)

## 2014-02-28 LAB — APTT: aPTT: 32 seconds (ref 24–37)

## 2014-02-28 MED ORDER — SODIUM CHLORIDE 0.9 % IV SOLN
Freq: Once | INTRAVENOUS | Status: AC
  Administered 2014-02-28: 09:00:00 via INTRAVENOUS

## 2014-02-28 MED ORDER — MIDAZOLAM HCL 2 MG/2ML IJ SOLN
INTRAMUSCULAR | Status: AC | PRN
  Administered 2014-02-28: 0.5 mg via INTRAVENOUS

## 2014-02-28 MED ORDER — MIDAZOLAM HCL 2 MG/2ML IJ SOLN
INTRAMUSCULAR | Status: AC
  Filled 2014-02-28: qty 6

## 2014-02-28 MED ORDER — FENTANYL CITRATE 0.05 MG/ML IJ SOLN
INTRAMUSCULAR | Status: AC
  Filled 2014-02-28: qty 6

## 2014-02-28 MED ORDER — PANTOPRAZOLE SODIUM 40 MG PO TBEC: 40.0000 mg | DELAYED_RELEASE_TABLET | Freq: Two times a day (BID) | ORAL | Status: AC

## 2014-02-28 NOTE — Discharge Summary (Signed)
Physician Discharge Summary  Stacey Cooley EUM:[id number] DOB: [birth year] DOA: 02/22/2014  PCP: Lottie Mussel, MD  Admit date: 02/22/2014 Discharge date: 02/28/2014  Time spent: *50 minutes  Recommendations for Outpatient Follow-up:  1. *Follow up Oncology in one week  Discharge Diagnoses:  Principal Problem:   Cholecystitis Active Problems:   Transaminitis   Obesity   Large granular lymphocytic leukemia   Anemia in neoplastic disease   Abnormal liver function tests   Acute cholecystitis   Chronic cholecystitis   Cholelithiasis   Abnormal CT scan, stomach   Abnormal CT scan, colon   Thrombocytopenia   Discharge Condition: Stable  Diet recommendation: Regular diet  Filed Weights   02/26/14 0418 02/27/14 0435 02/28/14 0459  Weight: 95.618 kg (210 lb 12.8 oz) 95.391 kg (210 lb 4.8 oz) 95.482 kg (210 lb 8 oz)    History of present illness:  72 y.o. female, with history of essential hypertension, rheumatic fever, dyslipidemia, gout on allopurinol, heart murmur, large granular lymphocytic leukemia for which she is under the care of Dr. 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 Who was being seen by Dr.Gorsuch in undergoing treatment for leukemia, her lab work was showing elevated total bilirubin and slightly elevated liver enzymes for a few weeks, she was also experiencing some epigastric/right upper quadrant pain worse by eating food better with bowel rest and defecation, her oncologist ordered a right upper quadrant ultrasound which showed evidence of cholecystitis, she then consulted  general surgeon Dr. Donne Hazel over the phone who recommended that patient come to the ER.  In the ER her diagnosis was again cholecystitis, general surgery was consulted and hospitalist team was requested to admit the patient. She currently denies any fever chills, no chest pain palpitations cough phlegm, chronic shortness of breath and fatigue, abdominal pain as above, no blood in stool or urine, no dysuria, no skin rashes or bruises, chronic joint pains and aches due to gout and arthritis, no focal weakness.   Hospital Course:   Abdominal pain/chronic cholecystitis/ gallstones Patient had ultrasound of the abdomen which showed evidence of cholecystitis. Patient has history of gallstones, surgery was consulted patient was empirically started on antibiotics  -HIDA negative for acute cholecystitis but suggests chronic cholecystitis  -02/25/2014 CT abdomen and pelvis--severe gastric wall thickening as well as bowel thickening of the transverse and ascending colon  - Gen. surgery does not think that patient has any acute infectious pathology pertaining to gallbladder so no more antibiotics are required at the time of discharge.  Gastric and Colon Wall thickening  -Appreciate GI  02/26/2014 EGD--nonbleeding ulcer GJ anastomosis  -Cdiff PCR--neg  - Patient will be discharged on Protonix 40 mg by mouth twice a day as per GI recommendation - Patient underwent CT-guided biopsy of the gastric mass. Biopsy results are pending she'll follow up with oncology as outpatient.  Large granular lymphocytic leukemia  -pt follows Dr. Ivery Quale plan for stomach biopsy is EGD unrevealing  -10/6--case discussed with Dr. Alvy Bimler  -continue IV stress steroids for today but decrease dose  -transfuse platelets and packed RBCs as desired. Will defer platelet transfusion to Gen. surgery based on their comfort level, particularly if surgery is needed  -presently no signs  of active bleed  -hold Methotrexate  -  Patient was started on stress dose steroids in the hospital - She'll be discharged on prednisone 5 mg by mouth twice a day.  Diarrhea  -C.diff PCR--neg   Hypertension  -d/c metoprolol tartrate as BP is soft   Chronic lower extremity edema/diastolic dysfunction  -presently denies any dyspnea, lungs are clear  -02/23/2001 echo EF 96-29%, grade 1 diastolic dysfunction, no WMA  - Will hold the lasix as BP is soft   Hypothyroidism  -Continue Synthroid   Gouty Arthritis  -Continue allopurinol   Anemia Patient is status post 1 unit PRBC given on 02/28/2014 before discharge  Procedures:  None  Consultations:  None  Discharge Exam: Filed Vitals:   02/28/14 1415  BP: 94/50  Pulse: 76  Temp: 98.2 F (36.8 C)  Resp: 18    General: Appears in no acute distress Cardiovascular: *S1s2 RRR Respiratory: Clear bilaterally  Discharge Instructions You were cared for by a hospitalist during your hospital stay. If you have any questions about your discharge medications or the care you received while you were in the hospital after you are discharged, you can call the unit and asked to speak with the hospitalist on call if the hospitalist that took care of you is not available. Once you are discharged, your primary care physician will handle any further medical issues. Please note that NO REFILLS for any discharge medications will be authorized once you are discharged, as it is imperative that you return to your primary care physician (or establish a relationship with a primary care physician if you do not have one) for your aftercare needs so that they can reassess your need for medications and monitor your lab values.  Discharge Instructions   Diet - low sodium heart healthy    Complete by:  As directed      Increase activity slowly    Complete by:  As directed           Current Discharge Medication List    START taking these medications   Details  pantoprazole (PROTONIX) 40 MG  tablet Take 1 tablet (40 mg total) by mouth 2 (two) times daily. Qty: 60 tablet, Refills: 2      CONTINUE these medications which have NOT CHANGED   Details  acetaminophen-codeine (TYLENOL #3) 300-30 MG per tablet Take 1-2 tablets by mouth 4 (four) times daily as needed (headache).    Associated Diagnoses: Anemia    allopurinol (ZYLOPRIM) 300 MG tablet Take 300 mg by mouth every morning.     aspirin 81 MG tablet Take 81 mg by mouth daily.    butalbital-acetaminophen-caffeine (FIORICET, ESGIC) 50-325-40 MG per tablet Take 1-2 tablets by mouth every 6 (six) hours as needed for headache (headache).    Calcium Carbonate-Vitamin D (CALCIUM 600+D) 600-400 MG-UNIT per tablet Take 1 tablet by mouth daily.    cholecalciferol (VITAMIN D) 1000 UNITS tablet Take 1,000 Units by mouth 2 (two) times daily.    citalopram (CELEXA) 20 MG tablet Take 20 mg by mouth every morning.    Associated Diagnoses: Anemia    Cyanocobalamin (VITAMIN B-12 SL) Place 1 tablet under the tongue daily.    fluticasone (FLONASE) 50 MCG/ACT nasal spray Place 3 sprays into the nose daily.    folic acid (FOLVITE) 1 MG tablet Take 1 tablet (1 mg total) by mouth daily. Qty: 90 tablet, Refills: 3   Associated Diagnoses: Large granular lymphocytic leukemia    levothyroxine (SYNTHROID, LEVOTHROID) 75 MCG  tablet Take 75 mcg by mouth daily before breakfast.     methotrexate (RHEUMATREX) 2.5 MG tablet Take 4 tablets (10 mg total) by mouth once a week. Caution:Chemotherapy. Protect from light. Qty: 48 tablet, Refills: 0    Multiple Vitamin (MULTIVITAMIN WITH MINERALS) TABS Take 1 tablet by mouth daily. Centrum    ondansetron (ZOFRAN) 8 MG tablet Take 8 mg by mouth every 8 (eight) hours as needed for nausea or vomiting (nausea).    oxybutynin (DITROPAN) 5 MG tablet Take 5 mg by mouth Daily.   Associated Diagnoses: Renal insufficiency; Vitamin B12 deficiency; Anemia    Potassium 99 MG TABS Take 1 tablet by mouth daily.     Associated Diagnoses: Edema; Large granular lymphocytic leukemia    predniSONE (DELTASONE) 5 MG tablet Take 5 mg by mouth 2 (two) times daily with a meal.    Associated Diagnoses: Large granular lymphocytic leukemia; Edema      STOP taking these medications     furosemide (LASIX) 40 MG tablet      metoprolol tartrate (LOPRESSOR) 25 MG tablet        Allergies  Allergen Reactions  . Latex Itching and Rash  . Penicillins Rash  . Sulfonamide Derivatives Rash   Follow-up Information   Call [facility], MD. (if you have any issues or need to follow up about your gallbladder.)    Specialty:  General Surgery   Contact information:   [place] [address] [phone]        The results of significant diagnostics from this hospitalization (including imaging, microbiology, ancillary and laboratory) are listed below for reference.    Significant Diagnostic Studies: Dg Chest 2 View  02/22/2014   CLINICAL DATA:  Preoperative exam prior to cholecystectomy; cough  EXAM: CHEST  2 VIEW  COMPARISON:  PA and lateral chest x-ray of August 02, 2012  FINDINGS: The lungs are adequately inflated. The interstitial markings are mildly increased bilaterally. The cardiopericardial silhouette is enlarged and the central pulmonary vascularity is engorged. There is tortuosity of the descending thoracic aorta. The bony thorax exhibits no acute abnormalities. The patient has undergone previous cervical and upper thoracic fusion.  IMPRESSION: There is low grade CHF with mild interstitial edema. There is no pleural effusion nor evidence of pneumonia.   Electronically Signed   By: David  Martinique   On: 02/22/2014 15:21   Nm Hepatobiliary  02/23/2014   CLINICAL DATA:  Abnormal liver function studies. Abdominal pain in the right upper quadrant with nausea for 3 weeks.  EXAM: NUCLEAR MEDICINE HEPATOBILIARY IMAGING  TECHNIQUE: Sequential images of the abdomen were obtained out to 60 minutes  following intravenous administration of radiopharmaceutical.  RADIOPHARMACEUTICALS:  5.5 Millicurie [id number] Choletec  COMPARISON:  Ultrasound abdomen 02/21/2014.  FINDINGS: There is normal homogeneous uptake and distribution of activity throughout the liver with normal washout and prompt appearance of activity in the intrahepatic and extrahepatic bile ducts and small bowel. There is delayed appearance of activity in the gallbladder, seen only at 3-4 hr after injection. Findings are compatible with chronic cholecystitis. Acute cholecystitis can sometimes demonstrate delayed uptake as well. No evidence of biliary obstruction.  IMPRESSION: Delayed appearance of tracer activity in the gallbladder most consistent with chronic cholecystitis. No evidence of biliary obstruction.   Electronically Signed   By: Lucienne Capers M.D.   On: 02/23/2014 04:30   Ct Abdomen Pelvis W Contrast  02/25/2014   CLINICAL DATA:  Epigastric pain, right upper quadrant pain, diarrhea  EXAM: CT ABDOMEN AND PELVIS WITH CONTRAST  TECHNIQUE: Multidetector CT imaging of the abdomen and pelvis was performed using the standard protocol following bolus administration of intravenous contrast.  CONTRAST:  1100mL OMNIPAQUE IOHEXOL 300 MG/ML  SOLN  COMPARISON:  None.  FINDINGS: The lung bases are clear.  The liver demonstrates no focal abnormality. There is no intrahepatic or extrahepatic biliary ductal dilatation. There are cholelithiasis with mild gallbladder wall thickening. The spleen demonstrates no focal abnormality. The kidneys, adrenal glands and pancreas are normal. The bladder is unremarkable.  There is bowel wall thickening involving the transverse and descending colon. 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 There is severe gastric wall thickening with a moderate amount of fluid in the excluded portion of the stomach. There is a small left paraumbilical hernia containing small bowel and a small amount of fluid. There is anasarca. There is  no bowel obstruction. There is no pneumoperitoneum, pneumatosis, or portal venous gas. There is no abdominal or pelvic free fluid. There is no lymphadenopathy.  The abdominal aorta is normal in caliber with atherosclerosis.  There are no lytic or sclerotic osseous lesions. There is diffuse lumbar spine spondylosis. 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  IMPRESSION: 1. Mild colonic wall thickening involving the transverse and descending colon most concerning for colitis secondary to infectious or inflammatory. 2. Severe gastric wall thickening with a moderate amount of fluid in the excluded portion of the stomach. This may reflect severe gastritis versus less likely gastric malignancy. Prior gastric bypass is noted without obstruction. 3. Cholelithiasis with mild gallbladder wall thickening. These findings can be seen with cholecystitis, but can also be reactive secondary to adjacent inflammatory process given the abnormality involving the stomach and colon. If there is concern regarding cholecystitis, further evaluation with HIDA scan is recommended.   Electronically Signed   By: Kathreen Devoid   On: 02/25/2014 11:15   US Abdomen Limited  02/21/2014   CLINICAL DATA:  Transaminitis. History of laparoscopic band surgery.  EXAM: US ABDOMEN LIMITED - RIGHT UPPER QUADRANT  COMPARISON:  Abdominal pelvic CT 03/12/2013.  FINDINGS: Gallbladder:  Gallstones are noted within the gallbladder neck. There is moderate gallbladder wall thickening to 7 mm. There is a small amount of pericholecystic fluid, and fluid is also present within Morison's pouch. Sonographic Percell Miller sign is positive according to the sonographer.  Common bile duct:  Diameter: 5 mm.  No evidence of choledocholithiasis.  Liver:  No focal lesion identified. Within normal limits in parenchymal echogenicity.  IMPRESSION: 1. Cholelithiasis, gallbladder wall thickening and positive sonographic Murphy sign worrisome  for acute cholecystitis. 2. No biliary dilatation. 3. These results will be called to the ordering clinician or representative by the Radiologist Assistant, and communication documented in the PACS or zVision Dashboard.   Electronically Signed   By: Camie Patience M.D.   On: 02/21/2014 09:28   Ct Biopsy  02/28/2014   CLINICAL DATA:  73 year old female with a history of prior gastric bypass surgery and larger granular lymphocytic leukemia. She has marked abnormal thickening of her gastric wall which could not be assessed endoscopically. She presents for CT-guided biopsy of the abnormal gastric wall to evaluate for leukemic or lymphomatous involvement versus chronic gastritis.  EXAM: CT BIOPSY  Date: 02/28/2014  PROCEDURE: 1. CT guided fine needle aspiration biopsy of gastric antral mass 2. CT-guided core biopsy of gastric antral mass Interventional Radiologist:  Criselda Peaches, MD  ANESTHESIA/SEDATION: Moderate (conscious)  sedation was used.  0.5 mg Versed  TECHNIQUE: Informed consent was obtained from the patient following explanation of the procedure, risks, benefits and alternatives. The patient understands, agrees and consents for the procedure. All questions were addressed. A time out was performed.  A planning axial CT scan was performed. The abnormal portion of the gastric antral wall was successfully identified. A suitable skin entry site was selected and marked. The region was then sterilely prepped and draped in the standard fashion using Betadine skin prep. Local anesthesia was attained by infiltration with 1% lidocaine. Under intermittent CT fluoroscopic guidance, a 17 gauge trocar needle was advanced through the abdominal wall in in to the intramural portion of the thickened gastric antrum. Multiple 20 gauge FNA biopsies were then obtained using Francine needles. These were sent to pathology for further analysis. Initial cytopathologic analysis demonstrated lymphoid tissue. It was felt that  additional material would be required for flow cytometry and additional analysis in order to obtain a complete diagnosis.  Therefore, several 18 gauge core biopsies were coaxially obtained using the BioPince automated biopsy device. The biopsy device and trocar needle were removed. An axial CT scan of the region was performed demonstrating no evidence of hemorrhage, perforation or other acute abnormality. The patient tolerated the procedure well.  COMPLICATIONS: None immediate  IMPRESSION: CT-guided fine needle aspiration and core biopsies of abnormally thickened anterior gastric wall.  Signed,  Criselda Peaches, MD  [facility]  [facility]   Electronically Signed   By: Jacqulynn Cadet M.D.   On: 02/28/2014 13:45    Microbiology: Recent Results (from the past 240 hour(s))  TECHNOLOGIST REVIEW     Status: None   Collection Time    02/19/14  8:44 AM      Result Value Ref Range Status   Technologist Review Variant lymphs present   Final  TECHNOLOGIST REVIEW     Status: None   Collection Time    02/22/14 10:15 AM      Result Value Ref Range Status   Technologist Review Variant lymphs present, rare myelocyte and nRBC    Final  CULTURE, BLOOD (ROUTINE X 2)     Status: None   Collection Time    02/22/14  4:03 PM      Result Value Ref Range Status   Specimen Description BLOOD RIGHT ANTECUBITAL   Final   Special Requests BOTTLES DRAWN AEROBIC AND ANAEROBIC 10 CC   Final   Culture  Setup Time     Final   Value: 02/22/2014 22:16     Performed at [facility]   Culture     Final   Value: NO GROWTH 5 DAYS     Performed at [facility]   Report Status 02/28/2014 FINAL   Final  CULTURE, BLOOD (ROUTINE X 2)     Status: None   Collection Time    02/22/14  4:08 PM      Result Value Ref Range Status   Specimen Description BLOOD RIGHT ANTECUBITAL   Final   Special Requests BOTTLES DRAWN AEROBIC AND ANAEROBIC 10 CC   Final   Culture   Setup Time     Final   Value: 02/22/2014 22:15     Performed at [facility]   Culture     Final   Value: NO GROWTH 5 DAYS     Performed at [facility]   Report Status 02/28/2014 FINAL   Final  CLOSTRIDIUM DIFFICILE  BY PCR     Status: None   Collection Time    02/25/14 10:15 AM      Result Value Ref Range Status   C difficile by pcr NEGATIVE  NEGATIVE Final   Comment: Performed at [facility]: Basic Metabolic Panel:  Recent Labs Lab 02/23/14 0435 02/24/14 0500 02/25/14 0455 02/27/14 0544 02/28/14 0430  NA 144 138 141 140 142  K 3.7 3.6* 3.8 3.5* 3.3*  CL 103 99 102 103 105  CO2 29 25 26 28 27   GLUCOSE 115* 134* 93 79 86  BUN 13 16 22 16 18   CREATININE 1.00 1.05 1.08 0.98 0.98  CALCIUM 9.0 8.9 8.6 8.7 8.7   Liver Function Tests:  Recent Labs Lab 02/22/14 1015 02/22/14 1402 02/24/14 0500 02/25/14 0455 02/27/14 0544  AST 65* 66* 43* 33 31  ALT 39 37* 30 26 22   ALKPHOS 162* 156* 128* 111 92  BILITOT 3.43* 3.4* 2.9* 2.8* 2.2*  PROT 5.4* 5.7* 5.5* 5.3* 5.0*  ALBUMIN 3.2* 3.4* 3.2* 3.1* 2.9*    Recent Labs Lab 02/22/14 1402  LIPASE 21   No results found for this basename: AMMONIA,  in the last 168 hours CBC:  Recent Labs Lab 02/22/14 1015  02/22/14 1402 02/23/14 0435 02/24/14 0500 02/25/14 0455 02/27/14 0544 02/28/14 0430  WBC 11.4*  < > 13.5* 7.7 7.0 5.6 3.4* 3.6*  NEUTROABS 1.6  --  1.8  --   --   --  0.7*  --   HGB 8.8*  < > 8.9* 8.2* 8.4* 7.7* 6.5* 7.1*  HCT 28.2*  < > 27.4* 25.6* 25.6* 23.3* 20.0* 21.7*  MCV 92.7  < > 90.7 91.4 90.1 90.3 91.7 90.0  PLT 51*  < > 36* 36* 39* 39* 56* 57*  < > = values in this interval not displayed. Cardiac Enzymes: No results found for this basename: CKTOTAL, CKMB, CKMBINDEX, TROPONINI,  in the last 168 hours BNP: BNP (last 3 results) No results found for this basename: PROBNP,  in the last 8760 hours CBG: No results found for this basename: GLUCAP,  in the last 168  hours     Signed:  Abdo Denault S  [facility] 02/28/2014, 3:30 PM

## 2014-02-28 NOTE — Progress Notes (Signed)
Wrong patient

## 2014-02-28 NOTE — Progress Notes (Signed)
Stacey Cooley   DOB:1941-03-05   KV#:[id number]    Subjective: She feels well. Denies pain no nausea. She is anxious to leave the hospital after biopsy. The patient denies any recent signs or symptoms of bleeding such as spontaneous epistaxis, hematuria or hematochezia. She received one unit of blood yesterday.  Objective:  Filed Vitals:   02/28/14 0754  BP: 98/52  Pulse: 69  Temp: 97.8 F (36.6 C)  Resp:      Intake/Output Summary (Last 24 hours) at 02/28/14 0759 Last data filed at 02/28/14 0540  Gross per 24 hour  Intake    365 ml  Output   1500 ml  Net  -1135 ml    GENERAL:alert, no distress and comfortable SKIN: skin color is pale, texture, turgor are normal, no rashes or significant lesions EYES: normal, Conjunctiva are pale and non-injected, sclera clear Musculoskeletal:no cyanosis of digits and no clubbing  NEURO: alert & oriented x 3 with fluent speech, no focal motor/sensory deficits   Labs:  Lab Results  Component Value Date   WBC 3.6* 02/28/2014   HGB 7.1* 02/28/2014   HCT 21.7* 02/28/2014   MCV 90.0 02/28/2014   PLT 57* 02/28/2014   NEUTROABS 0.7* 02/27/2014    Lab Results  Component Value Date   NA 142 02/28/2014   K 3.3* 02/28/2014   CL 105 02/28/2014   CO2 27 02/28/2014   Assessment & Plan:   Large granular lymphocytic leukemia  Her case was extensively reviewed at the hematology tumor board. On admission, she was on 10 mg of prednisone daily Methotrexate was placed on hold this week due to elevated liver function tests.  In the scope of things, treatment was placed on hold. Will initiate prednisone taper as out-patient. Please discharge her with 10 mg of prednisone daily.  Chronic cholecystitis   On 10/6, Dr. Alvy Bimler had an extensive discussion with General Surgery Overall, the symptoms are not consistent with severe acute cholecystitis warranting surgery.  Gastric wall thickening with abnormalities in her colon on CT scan CT abdomen pelvis on 10/5  showed CT showed gastric wall thickening, fluid in excluded gastric segment, mild colonic wall thickening and cholelithiasis. The portion that was abnormal on CT scan was not visualized on EGD. Differential diagnosis included severe gastritis due to chronic prednisone therapy versus undiagnosed malignancy Had normal colonoscopy at [facility] in 2012. No plans to repeat her colonoscopy from now. Pros and cons of attempting a CT-guided biopsy were discussed. The patient understood risk of bleeding and possibility of a nondiagnostic biopsy but she wants to proceed with this. This is to be performed today. I have arranged this with IR.  Anemia in neoplastic disease  The cause of the anemia is due to bone marrow infiltration and component of anemia of chronic disease. Infection is being ruled out, cultures negative to date She received 1 unit of blood on admission as Hb was 8 and she was symptomatic.  No bleeding issues reported, but Hb is still low at 7.1. Will receive 1 unit of blood today. She needs irradiated blood products.  We discussed some of the risks, benefits, and alternatives of blood transfusions. The patient is symptomatic from anemia and the hemoglobin level is critically low.  Some of the side-effects to be expected including risks of transfusion reactions, chills, infection, syndrome of volume overload and risk of hospitalization from various reasons and the patient is willing to proceed.  Thrombocytopenia  Suspect this is due to bone marrow infiltration from LGL and  recent methotrexate, as well as possible liver disease causing splenomegaly.  She does not need transfusions of platelets now, as no bleeding issues are reported unless indicated by GI or Surgery. As such, she received 1 unit of platelets prior to EGD. Platelet count today is 56,000 Continue to monitor, planned for repeat platelets today at 8 am prior to Biopsy  Elevated total bilirubin, pancytopenia and splenomegaly Her case was  reviewed at hematology tumor board and that is a possibility that patient may have chronic liver disease causing splenomegaly.  The splenomegaly could be related to liver disease or LGL leukemia. She is not a candidate to pursue for further methotrexate treatment due to elevated total bilirubin  Diarrhea, resolved This is a new symptom, Cdiff colitis is negative  Chronic diastolic dysfunction  On Lasix per primary team 02/23/2014 echo shows EF 45-36%, grade 1 diastolic dysfunction and no wall motion abnormalities.  Neutropenia She is not symptomatic. Observe only  Full Code Other medical issues as per admitting team  Discharge planning She has a very complicated case. Plan would be to proceed with CT-guided biopsy and then follow-up result as out-patient. Appointment is made for 945 am 03/05/14 Will sign off  [place], Berdia Lachman, MD 02/28/2014  7:59 AM

## 2014-02-28 NOTE — Telephone Encounter (Signed)
lvm for pt regarding to OCT appt..... °

## 2014-02-28 NOTE — Procedures (Signed)
Interventional Radiology Procedure Note  Procedure: CT guided FNA and core biopsies of antral gastric wall thickening Complications: None immediate. Recommendations: - Bedrest x 2 hrs - May resume diet - Path pending  Signed,  Criselda Peaches, MD

## 2014-02-28 NOTE — Progress Notes (Signed)
2 Days Post-Op  Subjective: She is back from biopsy and plans to go home later today. Transfused 1 unit yesterday Objective: Vital signs in last 24 hours: Temp:  [97.4 F (36.3 C)-98.6 F (37 C)] 98.4 F (36.9 C) (10/08 0959) Pulse Rate:  [57-79] 75 (10/08 1024) Resp:  [12-20] 15 (10/08 1024) BP: (80-105)/(33-58) 87/37 mmHg (10/08 1024) SpO2:  [95 %-100 %] 100 % (10/08 1024) Weight:  [95.482 kg (210 lb 8 oz)] 95.482 kg (210 lb 8 oz) (10/08 0459) Last BM Date: 02/26/14 Diet:  NPO Afebrile, hypotensive No labs Intake/Output from previous day: 10/07 0701 - 10/08 0700 In: 365 [Blood:365] Out: 1500 [Urine:1500] Intake/Output this shift: Total I/O In: 771 [I.V.:500; Blood:271] Out: -   General appearance: alert, cooperative and no distress GI: soft sore and alittle tender over GB.  dressing over biopsy site.  Lab Results:   Recent Labs  02/27/14 0544 02/28/14 0430  WBC 3.4* 3.6*  HGB 6.5* 7.1*  HCT 20.0* 21.7*  PLT 56* 57*    BMET  Recent Labs  02/27/14 0544 02/28/14 0430  NA 140 142  K 3.5* 3.3*  CL 103 105  CO2 28 27  GLUCOSE 79 86  BUN 16 18  CREATININE 0.98 0.98  CALCIUM 8.7 8.7   PT/INR  Recent Labs  02/27/14 0544 02/28/14 0430  LABPROT 19.1* 20.0*  INR 1.59* 1.69*     Recent Labs Lab 02/22/14 1015 02/22/14 1402 02/24/14 0500 02/25/14 0455 02/27/14 0544  AST 65* 66* 43* 33 31  ALT 39 37* 30 26 22   ALKPHOS 162* 156* 128* 111 92  BILITOT 3.43* 3.4* 2.9* 2.8* 2.2*  PROT 5.4* 5.7* 5.5* 5.3* 5.0*  ALBUMIN 3.2* 3.4* 3.2* 3.1* 2.9*     Lipase     Component Value Date/Time   LIPASE 21 02/22/2014 1402     Studies/Results: No results found.  Medications: . allopurinol  300 mg Oral q morning - 10a  . citalopram  20 mg Oral q morning - 70W  . folic acid  1 mg Oral Daily  . hydrocortisone sod succinate (SOLU-CORTEF) inj  50 mg Intravenous Daily  . levothyroxine  75 mcg Oral QAC breakfast  . midazolam      .  morphine injection  4 mg  Intravenous Once  . oxybutynin  5 mg Oral BID  . pantoprazole  40 mg Oral BID  . piperacillin-tazobactam (ZOSYN)  IV  3.375 g Intravenous Q8H  . protein supplement  8 oz Oral TID  . 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 Lymphocytic leukemia Anemia/thrombocytopenia Abdominal wall hernia   Plan:  She is going home today after she gets some blood.  I will leave Dr. Pollie Friar information if we need to follow up.  With her pancytopenia, she is not a good candidate for any surgery.  She will follow up with Dr. Alvy Bimler and we can be reached at anytime.     LOS: 6 days    Cooley,Stacey 02/28/2014  Agree with above. Her husband is in the room with her. She had her stomach biopsy earlier today.  She has been transfused.  She has a treatment plan for her marginal ulcer. She is ready for discharge. There is no reason to follow up with [place], unless some other surgical issue is found.    Alphonsa Overall, MD, [facility] Surgery Pager: [phone] Office phone:  [phone]

## 2014-02-28 NOTE — Evaluation (Signed)
Physical Therapy Evaluation Patient Details Name: Rowynn Mcweeney MRN: [id number] DOB: [birth year] Today's Date: 02/28/2014   History of Present Illness  73 yo female admitted with cholecystitis. hx of lymphocytic leukemia, chronic LE edema, HTn, obesity, MRSA, tachcardia, uternine cancer, DJD.   Clinical Impression  On eval, pt was Min guard assist for mobility-able to ambulate ~350 feet with straight cane. Tolerated activity well. No LOB or dizziness with activity. Pt states she is planning to d/c later today. No follow up PT needs.     Follow Up Recommendations No PT follow up    Equipment Recommendations  None recommended by PT    Recommendations for Other Services       Precautions / Restrictions Precautions Precautions: Fall Restrictions Weight Bearing Restrictions: No      Mobility  Bed Mobility Overal bed mobility: Modified Independent Bed Mobility: Supine to Sit     Supine to sit: Modified independent (Device/Increase time)        Transfers Overall transfer level: Modified independent                  Ambulation/Gait Ambulation/Gait assistance: Min guard Ambulation Distance (Feet): 350 Feet Assistive device: Straight cane Gait Pattern/deviations: Step-through pattern;Wide base of support     General Gait Details: slow but steady. close guard for safety. no c/o dizziness.   Stairs            Wheelchair Mobility    Modified Rankin (Stroke Patients Only)       Balance                                             Pertinent Vitals/Pain Pain Assessment: No/denies pain    Home Living Family/patient expects to be discharged to:: Private residence Living Arrangements: Spouse/significant other Available Help at Discharge: Family Type of Home: House Home Access: [place]: One Bootjack: Environmental consultant - 2 wheels;Cane - single point;Shower seat - built in      Prior Function Level of  Independence: Independent with assistive device(s)         Comments: cane     Hand Dominance        Extremity/Trunk Assessment   Upper Extremity Assessment: Overall WFL for tasks assessed           Lower Extremity Assessment: Overall WFL for tasks assessed      Cervical / Trunk Assessment: Normal  Communication   Communication: No difficulties  Cognition Arousal/Alertness: Awake/alert Behavior During Therapy: WFL for tasks assessed/performed Overall Cognitive Status: Within Functional Limits for tasks assessed                      General Comments      Exercises        Assessment/Plan    PT Assessment Patent does not need any further PT services  PT Diagnosis     PT Problem List    PT Treatment Interventions     PT Goals (Current goals can be found in the Care Plan section) Acute Rehab PT Goals Patient Stated Goal: home today PT Goal Formulation: No goals set, d/c therapy    Frequency     Barriers to discharge        Co-evaluation               End of  Session Equipment Utilized During Treatment: Gait belt Activity Tolerance: Patient tolerated treatment well Patient left: in bed;with call bell/phone within reach;with family/visitor present           Time: 6226-3335 PT Time Calculation (min): 27 min   Charges:   PT Evaluation $Initial PT Evaluation Tier I: 1 Procedure PT Treatments $Gait Training: 8-22 mins   PT G Codes:          Weston Anna, MPT Pager: [phone]

## 2014-02-28 NOTE — Progress Notes (Signed)
     [facility] Progress Note  Subjective: Feels well. No abd pain. No nausea or voming. Had bx with IR earlier today. Currently receiving blood. Plans on going home later today. No diarrhea.   Objective:  Vital signs in last 24 hours: Temp:  [97.4 F (36.3 C)-98.6 F (37 C)] 97.9 F (36.6 C) (10/08 1210) Pulse Rate:  [57-106] 85 (10/08 1210) Resp:  [12-20] 15 (10/08 1024) BP: (80-106)/(33-59) 104/52 mmHg (10/08 1210) SpO2:  [95 %-100 %] 99 % (10/08 1210) Weight:  [210 lb 8 oz (95.482 kg)] 210 lb 8 oz (95.482 kg) (10/08 0459) Last BM Date: 02/27/14 General:   Alert,  Well-developed, white female in NAD Heart:  Regular rate and rhythm; no murmurs Pulm: lungs clear to ausc bilat Abdomen:  Soft, dressing intact over biopst site. Mild tenderness RUQ. No rebound or guarding. Extremities:  Without edema. Neurologic:  Alert and  oriented x4;  grossly normal neurologically. Psych:  Alert and cooperative. Normal mood and affect.  Intake/Output from previous day: 10/07 0701 - 10/08 0700 In: 365 [Blood:365] Out: 1500 [Urine:1500] Intake/Output this shift: Total I/O In: 1114 [I.V.:500; Blood:614] Out: -   Lab Results:  Recent Labs  02/27/14 0544 02/28/14 0430  WBC 3.4* 3.6*  HGB 6.5* 7.1*  HCT 20.0* 21.7*  PLT 56* 57*   BMET  Recent Labs  02/27/14 0544 02/28/14 0430  NA 140 142  K 3.5* 3.3*  CL 103 105  CO2 28 27  GLUCOSE 79 86  BUN 16 18  CREATININE 0.98 0.98  CALCIUM 8.7 8.7   LFT  Recent Labs  02/27/14 0544  PROT 5.0*  ALBUMIN 2.9*  AST 31  ALT 22  ALKPHOS 92  BILITOT 2.2*   PT/INR  Recent Labs  02/27/14 0544 02/28/14 0430  LABPROT 19.1* 20.0*  INR 1.59* 1.69*    ASSESSMENT/PLAN:  56. 73 year old female with upper abdominal pain / elevated LFTs (on methotrexate) / cholelithiasis. HIDA suggestive of chronic cholecystitis. Surgery evaluated but weren't convinced pain was gb related. CTscan obtained and revealed severe gastric  wall thickening. For evaluation she had EGD10/6/15  with finding of a medium sized anastomotic ulcer (remote Roux-en-Y). Because of surgical anatomy we couldn't visualize area of stomach which was abnormal on CTscan. Had CT guided bx of stomach this morning. Pt should stay on pantoprazole long term, and follow up with Dr Fuller Plan as needed.  2. Leukemia, treatment on hold. She is pancytopenic. Currently getting blood. HgB this morning 7.1.  3. Diarrhea. Resolved. Cdiff negative.     LOS: 6 days   Hvozdovic, Deloris Ping 02/28/2014, Pager [phone]     Attending physician's note   I have taken an interval history, reviewed the chart and examined the patient. I agree with the Advanced Practitioner's note, impression and recommendations. Long term PPI for ulcer healing and then for ulcer prophylaxis. CT guided biopsy result is pending. Follow up with me as needed. Follow up with Dr. Alvy Bimler next week as planned. GI signing off.   Pricilla Riffle. Fuller Plan, MD [facility]

## 2014-03-01 LAB — TYPE AND SCREEN
ABO/RH(D): O NEG
Antibody Screen: NEGATIVE
UNIT DIVISION: 0
Unit division: 0
Unit division: 0
Unit division: 0

## 2014-03-01 LAB — PREPARE PLATELET PHERESIS: Unit division: 0

## 2014-03-05 ENCOUNTER — Ambulatory Visit: Payer: Medicare Other | Admitting: Hematology and Oncology

## 2014-03-05 ENCOUNTER — Ambulatory Visit (HOSPITAL_BASED_OUTPATIENT_CLINIC_OR_DEPARTMENT_OTHER): Payer: Medicare Other | Admitting: Hematology and Oncology

## 2014-03-05 ENCOUNTER — Other Ambulatory Visit: Payer: Medicare Other

## 2014-03-05 ENCOUNTER — Other Ambulatory Visit (HOSPITAL_BASED_OUTPATIENT_CLINIC_OR_DEPARTMENT_OTHER): Payer: Medicare Other

## 2014-03-05 ENCOUNTER — Encounter: Payer: Self-pay | Admitting: Hematology and Oncology

## 2014-03-05 ENCOUNTER — Telehealth: Payer: Self-pay | Admitting: Hematology and Oncology

## 2014-03-05 VITALS — BP 115/45 | HR 67 | Temp 98.2°F | Resp 18 | Ht 62.0 in | Wt 216.2 lb

## 2014-03-05 DIAGNOSIS — R609 Edema, unspecified: Secondary | ICD-10-CM

## 2014-03-05 DIAGNOSIS — D630 Anemia in neoplastic disease: Secondary | ICD-10-CM

## 2014-03-05 DIAGNOSIS — I950 Idiopathic hypotension: Secondary | ICD-10-CM

## 2014-03-05 DIAGNOSIS — K259 Gastric ulcer, unspecified as acute or chronic, without hemorrhage or perforation: Secondary | ICD-10-CM | POA: Insufficient documentation

## 2014-03-05 DIAGNOSIS — C91Z0 Other lymphoid leukemia not having achieved remission: Secondary | ICD-10-CM

## 2014-03-05 DIAGNOSIS — T50905A Adverse effect of unspecified drugs, medicaments and biological substances, initial encounter: Secondary | ICD-10-CM

## 2014-03-05 DIAGNOSIS — K819 Cholecystitis, unspecified: Secondary | ICD-10-CM

## 2014-03-05 DIAGNOSIS — K253 Acute gastric ulcer without hemorrhage or perforation: Secondary | ICD-10-CM

## 2014-03-05 DIAGNOSIS — M1990 Unspecified osteoarthritis, unspecified site: Secondary | ICD-10-CM

## 2014-03-05 DIAGNOSIS — D6959 Other secondary thrombocytopenia: Secondary | ICD-10-CM

## 2014-03-05 DIAGNOSIS — K3189 Other diseases of stomach and duodenum: Secondary | ICD-10-CM

## 2014-03-05 DIAGNOSIS — R1900 Intra-abdominal and pelvic swelling, mass and lump, unspecified site: Secondary | ICD-10-CM

## 2014-03-05 LAB — HOLD TUBE, BLOOD BANK

## 2014-03-05 LAB — COMPREHENSIVE METABOLIC PANEL (CC13)
ALBUMIN: 3.3 g/dL — AB (ref 3.5–5.0)
ALK PHOS: 100 U/L (ref 40–150)
ALT: 38 U/L (ref 0–55)
AST: 56 U/L — AB (ref 5–34)
Anion Gap: 12 mEq/L — ABNORMAL HIGH (ref 3–11)
BUN: 13.3 mg/dL (ref 7.0–26.0)
CALCIUM: 9.2 mg/dL (ref 8.4–10.4)
CHLORIDE: 106 meq/L (ref 98–109)
CO2: 24 mEq/L (ref 22–29)
Creatinine: 0.9 mg/dL (ref 0.6–1.1)
Glucose: 94 mg/dl (ref 70–140)
POTASSIUM: 3.4 meq/L — AB (ref 3.5–5.1)
Sodium: 141 mEq/L (ref 136–145)
Total Bilirubin: 2.9 mg/dL — ABNORMAL HIGH (ref 0.20–1.20)
Total Protein: 5.7 g/dL — ABNORMAL LOW (ref 6.4–8.3)

## 2014-03-05 LAB — CBC WITH DIFFERENTIAL/PLATELET
BASO%: 0 % (ref 0.0–2.0)
Basophils Absolute: 0 10*3/uL (ref 0.0–0.1)
EOS%: 0.9 % (ref 0.0–7.0)
Eosinophils Absolute: 0 10*3/uL (ref 0.0–0.5)
HCT: 28.6 % — ABNORMAL LOW (ref 34.8–46.6)
HEMOGLOBIN: 9.2 g/dL — AB (ref 11.6–15.9)
LYMPH#: 1.1 10*3/uL (ref 0.9–3.3)
LYMPH%: 50.2 % — ABNORMAL HIGH (ref 14.0–49.7)
MCH: 29.9 pg (ref 25.1–34.0)
MCHC: 32.2 g/dL (ref 31.5–36.0)
MCV: 92.9 fL (ref 79.5–101.0)
MONO#: 0.2 10*3/uL (ref 0.1–0.9)
MONO%: 7.4 % (ref 0.0–14.0)
NEUT#: 0.9 10*3/uL — ABNORMAL LOW (ref 1.5–6.5)
NEUT%: 41.5 % (ref 38.4–76.8)
Platelets: 49 10*3/uL — ABNORMAL LOW (ref 145–400)
RBC: 3.08 10*6/uL — AB (ref 3.70–5.45)
RDW: 21.6 % — ABNORMAL HIGH (ref 11.2–14.5)
WBC: 2.2 10*3/uL — ABNORMAL LOW (ref 3.9–10.3)

## 2014-03-05 MED ORDER — PREDNISONE 2.5 MG PO TABS: 2.5000 mg | ORAL_TABLET | Freq: Two times a day (BID) | ORAL | Status: DC

## 2014-03-05 NOTE — Telephone Encounter (Signed)
, °

## 2014-03-05 NOTE — Assessment & Plan Note (Signed)
The cause of the anemia is due to bone marrow infiltration and component of anemia of chronic disease. I will observe for now. I would transfuse her with blood if hemoglobin dropped to less than 8 g. She needs irradiated blood products.

## 2014-03-05 NOTE — Assessment & Plan Note (Addendum)
Her blood counts are still low but overall stable. I would continue prednisone taper to 2.5 mg twice a day due to recent severe gastritis. I have ordered serology to see if the patient has rheumatoid arthritis.  I will continue to hold methotrexate for now and continue weekly supportive care, and transfusion when necessary and blood work monitoring.

## 2014-03-05 NOTE — Assessment & Plan Note (Signed)
I recommend holding off metoprolol as the patient is hypotensive and symptomatic.

## 2014-03-05 NOTE — Assessment & Plan Note (Signed)
She will continue on high-dose proton pump inhibitor twice a day. Plan to wean her off prednisone as above.

## 2014-03-05 NOTE — Assessment & Plan Note (Signed)
The cause is unclear.  Echocardiogram showed no evidence of congestive heart failure. I suspect this is due to poor venous circulation and possibly diastolic failure. In the meantime, she will continue on furosemide daily.

## 2014-03-05 NOTE — Assessment & Plan Note (Signed)
CT guided biopsy of the stomach mass is pending. I will call the patient with results once it is available.

## 2014-03-05 NOTE — Assessment & Plan Note (Signed)
Since discontinuation of methotrexate, her platelet count is slightly better. I suspect this is due to bone marrow infiltration from LGL and liver disease. She does not need transfusions of platelets now. We'll monitor her blood counts carefully.

## 2014-03-05 NOTE — Assessment & Plan Note (Signed)
I had extensive discussion with the surgeon last week. Plan to hold off cholecystectomy as we suspect she has chronic cholecystitis.

## 2014-03-05 NOTE — Progress Notes (Signed)
[facility] OFFICE PROGRESS NOTE  Patient Care Team: Madelyn Brunner, MD as PCP - General (Unknown Physician Specialty) Heath Lark, MD as Consulting Physician (Hematology and Oncology) Rollene Rotunda, MD as Consulting Physician (Hematology and Oncology)  SUMMARY OF ONCOLOGIC HISTORY: She was found to have anemia and was placed on observation. The cause of the anemia was thought to be related to chronic kidney disease. The patient also has history of Roux-en-Y gastric bypass and it was thought to be the contributing factor. 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 On 04/09/2013, she was started on prednisone 60 mg daily and methotrexate 7.5 mg once a week by mouth On 04/16/2013, she developed acute renal failure with hyperkalemia. Prednisone dose was reduced to 40 mg a day and methotrexate was placed on hold On 04/23/2013, acute renal failure was improving. The prednisone dose was reduced to 20 mg a day and would resume methotrexate at 7.5 mg once a week. On 04/27/2013, I reduced methotrexate to 5 mg once a week and prednisone dose was reduced to 10 mg alternate with 20 mg On 05/04/2013, I order one unit of blood transfusion. I The prednisone dose the same but reduced methotrexate to 2.5 mg once a week On 05/11/2013, I started to reduce prednisone dose further to 10 mg daily but to keep methotrexate at 2.5 mg once a week On 05/25/2013, I further reduce prednisone to 10 mg alternate with 7.5 mg and keep methotrexate at 2.5 mg once a week On 06/09/2013, and I further reduce prednisone to 5 mg daily and keep methotrexate at 2.5 mg once a week On 08/14/2013, I increase the methotrexate to 5 mg once a week and Prednisone at 5 mg daily. On 09/10/2013, I reduced prednisone to 5 mg alternate with 2.5 mg and keep methotrexate at 5 mg once a week. On 10/10/2013, I increased the methotrexate to 10 mg once a week and  The prednisone at 5 mg alternate with 2.5 mg. On 10/23/2013, she saw a hematologist at [facility] for second opinion. Further recommendations are pending. On 12/18/13: prednisone is tapered to 2.5 mg daily. Methotrexate at 10 mg per week. On 01/21/14: Prednisone was discontinued. She was taper off methotrexate. On 02/10/2014, methotrexate was reinitiated at 10 mg per week along with 10 mg prednisone. On 02/19/2014, methotrexate was placed on hold due to elevated liver function tests. On 02/21/2014, ultrasound abdomen showed cholecystitis. From 02/22/2014 to 02/28/2014, the patient was hospitalized for cholecystitis, gastric mass and underwent CT-guided biopsy. INTERVAL HISTORY: Please see below for problem oriented charting. She felt better since dismissal. Denies fevers or chills. Her appetite has improved and she had improved energy level. She said she has persistent bilateral lower extremity edema. Denies any nausea or epigastric discomfort.  REVIEW OF SYSTEMS:   Constitutional: Denies fevers, chills or abnormal weight loss Eyes: Denies blurriness of vision Ears, nose, mouth, throat, and face: Denies mucositis or sore throat Respiratory: Denies cough, dyspnea or wheezes Cardiovascular: Denies palpitation, chest discomfort  Gastrointestinal:  Denies nausea, heartburn or change in bowel habits Skin: Denies abnormal skin rashes Lymphatics: Denies new lymphadenopathy or easy bruising Neurological:Denies numbness, tingling or new weaknesses Behavioral/Psych: Mood is stable, no new changes  All other systems were reviewed with the patient and are negative.  I have reviewed the past medical history, past surgical history, social history and family history with the patient and they are unchanged from previous note.  ALLERGIES:  is allergic to latex; penicillins; and sulfonamide derivatives.  MEDICATIONS:  Current Outpatient Prescriptions  Medication Sig Dispense Refill  .  acetaminophen-codeine (TYLENOL #3) 300-30 MG per tablet Take 1-2 tablets by mouth 4 (four) times daily as needed (headache).       Marland Kitchen allopurinol (ZYLOPRIM) 300 MG tablet Take 300 mg by mouth every morning.       . butalbital-acetaminophen-caffeine (FIORICET, ESGIC) 50-325-40 MG per tablet Take 1-2 tablets by mouth every 6 (six) hours as needed for headache (headache).      . Calcium Carbonate-Vitamin D (CALCIUM 600+D) 600-400 MG-UNIT per tablet Take 1 tablet by mouth daily.      . cholecalciferol (VITAMIN D) 1000 UNITS tablet Take 1,000 Units by mouth 2 (two) times daily.      . citalopram (CELEXA) 20 MG tablet Take 20 mg by mouth every morning.       . Cyanocobalamin (VITAMIN B-12 SL) Place 1 tablet under the tongue daily.      . fluticasone (FLONASE) 50 MCG/ACT nasal spray Place 3 sprays into the nose daily.      . folic acid (FOLVITE) 1 MG tablet Take 1 tablet (1 mg total) by mouth daily.  90 tablet  3  . levothyroxine (SYNTHROID, LEVOTHROID) 75 MCG tablet Take 75 mcg by mouth daily before breakfast.       . methotrexate (RHEUMATREX) 2.5 MG tablet Take 4 tablets (10 mg total) by mouth once a week. Caution:Chemotherapy. Protect from light.  48 tablet  0  . Multiple Vitamin (MULTIVITAMIN WITH MINERALS) TABS Take 1 tablet by mouth daily. Centrum      . ondansetron (ZOFRAN) 8 MG tablet Take 8 mg by mouth every 8 (eight) hours as needed for nausea or vomiting (nausea).      Marland Kitchen oxybutynin (DITROPAN) 5 MG tablet Take 5 mg by mouth Daily.      . pantoprazole (PROTONIX) 40 MG tablet Take 1 tablet (40 mg total) by mouth 2 (two) times daily.  60 tablet  2  . Potassium 99 MG TABS Take 1 tablet by mouth daily.       . predniSONE (DELTASONE) 2.5 MG tablet Take 1 tablet (2.5 mg total) by mouth 2 (two) times daily with a meal.  60 tablet  3  . aspirin 81 MG tablet Take 81 mg by mouth daily.       No current facility-administered medications for this visit.    PHYSICAL EXAMINATION: ECOG PERFORMANCE STATUS: 1  - Symptomatic but completely ambulatory  Filed Vitals:   03/05/14 1000  BP: 115/45  Pulse: 67  Temp: 98.2 F (36.8 C)  Resp: 18   Filed Weights   03/05/14 1000  Weight: 216 lb 3.2 oz (98.068 kg)    GENERAL:alert, no distress and comfortable. She is morbidly obese SKIN: skin color is pale, texture, turgor are normal, no rashes or significant lesions EYES: normal, Conjunctiva are pale and non-injected, sclera clear OROPHARYNX:no exudate, no erythema and lips, buccal mucosa, and tongue normal  NECK: supple, thyroid normal size, non-tender, without nodularity LYMPH:  no palpable lymphadenopathy in the cervical, axillary or inguinal LUNGS: clear to auscultation and percussion with normal breathing effort HEART: regular rate & rhythm and no murmurs with moderate bilateral lower extremity edema ABDOMEN:abdomen soft, non-tender and normal bowel sounds Musculoskeletal:no cyanosis of digits and no clubbing  NEURO: alert & oriented x 3 with fluent speech, no focal motor/sensory deficits  LABORATORY DATA:  I have reviewed the data as listed  Component Value Date/Time   NA 141 03/05/2014 0921   NA 142 02/28/2014 0430   K 3.4* 03/05/2014 0921   K 3.3* 02/28/2014 0430   CL 105 02/28/2014 0430   CL 105 10/25/2012 1135   CO2 24 03/05/2014 0921   CO2 27 02/28/2014 0430   GLUCOSE 94 03/05/2014 0921   GLUCOSE 86 02/28/2014 0430   GLUCOSE 85 10/25/2012 1135   BUN 13.3 03/05/2014 0921   BUN 18 02/28/2014 0430   CREATININE 0.9 03/05/2014 0921   CREATININE 0.98 02/28/2014 0430   CREATININE 1.22* 09/08/2011 1120   CALCIUM 9.2 03/05/2014 0921   CALCIUM 8.7 02/28/2014 0430   PROT 5.7* 03/05/2014 0921   PROT 5.0* 02/27/2014 0544   ALBUMIN 3.3* 03/05/2014 0921   ALBUMIN 2.9* 02/27/2014 0544   AST 56* 03/05/2014 0921   AST 31 02/27/2014 0544   ALT 38 03/05/2014 0921   ALT 22 02/27/2014 0544   ALKPHOS 100 03/05/2014 0921   ALKPHOS 92 02/27/2014 0544   BILITOT 2.90* 03/05/2014 0921   BILITOT 2.2*  02/27/2014 0544   GFRNONAA 57* 02/28/2014 0430   GFRAA 66* 02/28/2014 0430    No results found for this basename: SPEP,  UPEP,   kappa and lambda light chains    Lab Results  Component Value Date   WBC 2.2* 03/05/2014   NEUTROABS 0.9* 03/05/2014   HGB 9.2* 03/05/2014   HCT 28.6* 03/05/2014   MCV 92.9 03/05/2014   PLT 49* 03/05/2014      Chemistry      Component Value Date/Time   NA 141 03/05/2014 0921   NA 142 02/28/2014 0430   K 3.4* 03/05/2014 0921   K 3.3* 02/28/2014 0430   CL 105 02/28/2014 0430   CL 105 10/25/2012 1135   CO2 24 03/05/2014 0921   CO2 27 02/28/2014 0430   BUN 13.3 03/05/2014 0921   BUN 18 02/28/2014 0430   CREATININE 0.9 03/05/2014 0921   CREATININE 0.98 02/28/2014 0430   CREATININE 1.22* 09/08/2011 1120      Component Value Date/Time   CALCIUM 9.2 03/05/2014 0921   CALCIUM 8.7 02/28/2014 0430   ALKPHOS 100 03/05/2014 0921   ALKPHOS 92 02/27/2014 0544   AST 56* 03/05/2014 0921   AST 31 02/27/2014 0544   ALT 38 03/05/2014 0921   ALT 22 02/27/2014 0544   BILITOT 2.90* 03/05/2014 0921   BILITOT 2.2* 02/27/2014 0544     ASSESSMENT & PLAN:  Large granular lymphocytic leukemia Her blood counts are still low but overall stable. I would continue prednisone taper to 2.5 mg twice a day due to recent severe gastritis. I have ordered serology to see if the patient has rheumatoid arthritis.  I will continue to hold methotrexate for now and continue weekly supportive care, and transfusion when necessary and blood work monitoring.  Anemia in neoplastic disease The cause of the anemia is due to bone marrow infiltration and component of anemia of chronic disease. I will observe for now. I would transfuse her with blood if hemoglobin dropped to less than 8 g. She needs irradiated blood products.    Thrombocytopenia due to drugs Since discontinuation of methotrexate, her platelet count is slightly better. I suspect this is due to bone marrow infiltration from LGL and  liver disease. She does not need transfusions of platelets now. We'll monitor her blood counts carefully.   Cholecystitis I had extensive discussion with the surgeon last week. Plan to hold off cholecystectomy as we suspect she has  chronic cholecystitis.  Hypotension I recommend holding off metoprolol as the patient is hypotensive and symptomatic.  Edema The cause is unclear.  Echocardiogram showed no evidence of congestive heart failure. I suspect this is due to poor venous circulation and possibly diastolic failure. In the meantime, she will continue on furosemide daily.  Stomach ulcer She will continue on high-dose proton pump inhibitor twice a day. Plan to wean her off prednisone as above.  Mass of stomach CT guided biopsy of the stomach mass is pending. I will call the patient with results once it is available.    No orders of the defined types were placed in this encounter.   All questions were answered. The patient knows to call the clinic with any problems, questions or concerns. No barriers to learning was detected. I spent 30 minutes counseling the patient face to face. The total time spent in the appointment was 40 minutes and more than 50% was on counseling and review of test results     [facility], [place], MD 03/05/2014 7:47 PM

## 2014-03-06 LAB — CYCLIC CITRUL PEPTIDE ANTIBODY, IGG: Cyclic Citrullin Peptide Ab: <2 U/mL (ref 0.0–5.0)

## 2014-03-06 LAB — RHEUMATOID FACTOR

## 2014-03-07 ENCOUNTER — Telehealth: Payer: Self-pay | Admitting: *Deleted

## 2014-03-07 NOTE — Telephone Encounter (Signed)
Husband has left VM x 3 today asking if Dr. Alvy Bimler has Pathology results yet on pt?  He states they are very anxious for the results.  Called him back and informed Pathology results not available yet.  Dr. Alvy Bimler will call when she gets the results.  He verbalized understanding.

## 2014-03-11 ENCOUNTER — Telehealth: Payer: Self-pay | Admitting: Nurse Practitioner

## 2014-03-11 NOTE — Telephone Encounter (Signed)
Message copied by Alla Feeling on Mon Mar 11, 2014  8:53 AM ------      Message from: Cathlean Cower      Created: Mon Mar 11, 2014  8:42 AM      Regarding: FW: inconclusive                   ----- Message -----         From: Heath Lark, MD         Sent: 03/08/2014   8:14 PM           To: Cathlean Cower, RN      Subject: inconclusive                                             Please let the patient know that the biopsy result was inconclusive, waiting until after Hem tumor Board next Tuesday before I call them likely, Wednesday or so      ----- Message -----         From: Lab In [place]: 03/01/2014   6:31 AM           To: Heath Lark, MD                   ------

## 2014-03-11 NOTE — Telephone Encounter (Signed)
Patient notified of the message below. She states she las labs 03/12/14 but not MD appt. Patient informed that she will hear from our office likely Wednesday; she verbalizes understanding and thanks for the call.   [place] notified of Dr. Calton Dach message below. He verbalizes understanding.

## 2014-03-12 ENCOUNTER — Telehealth: Payer: Self-pay | Admitting: Hematology and Oncology

## 2014-03-12 ENCOUNTER — Other Ambulatory Visit (HOSPITAL_BASED_OUTPATIENT_CLINIC_OR_DEPARTMENT_OTHER): Payer: Medicare Other

## 2014-03-12 ENCOUNTER — Telehealth: Payer: Self-pay | Admitting: *Deleted

## 2014-03-12 ENCOUNTER — Other Ambulatory Visit: Payer: Self-pay | Admitting: Hematology and Oncology

## 2014-03-12 ENCOUNTER — Encounter: Payer: Self-pay | Admitting: *Deleted

## 2014-03-12 ENCOUNTER — Other Ambulatory Visit: Payer: Medicare Other

## 2014-03-12 DIAGNOSIS — C91Z0 Other lymphoid leukemia not having achieved remission: Secondary | ICD-10-CM

## 2014-03-12 DIAGNOSIS — D630 Anemia in neoplastic disease: Secondary | ICD-10-CM

## 2014-03-12 DIAGNOSIS — R609 Edema, unspecified: Secondary | ICD-10-CM

## 2014-03-12 LAB — COMPREHENSIVE METABOLIC PANEL (CC13)
ALBUMIN: 3.2 g/dL — AB (ref 3.5–5.0)
ALT: 61 U/L — ABNORMAL HIGH (ref 0–55)
AST: 82 U/L — ABNORMAL HIGH (ref 5–34)
Alkaline Phosphatase: 110 U/L (ref 40–150)
Anion Gap: 9 mEq/L (ref 3–11)
BUN: 12 mg/dL (ref 7.0–26.0)
CALCIUM: 9.2 mg/dL (ref 8.4–10.4)
CHLORIDE: 104 meq/L (ref 98–109)
CO2: 28 mEq/L (ref 22–29)
CREATININE: 1 mg/dL (ref 0.6–1.1)
GLUCOSE: 99 mg/dL (ref 70–140)
POTASSIUM: 3 meq/L — AB (ref 3.5–5.1)
Sodium: 141 mEq/L (ref 136–145)
Total Bilirubin: 3.03 mg/dL — ABNORMAL HIGH (ref 0.20–1.20)
Total Protein: 5.4 g/dL — ABNORMAL LOW (ref 6.4–8.3)

## 2014-03-12 LAB — CBC WITH DIFFERENTIAL/PLATELET
BASO%: 0.9 % (ref 0.0–2.0)
BASOS ABS: 0.1 10*3/uL (ref 0.0–0.1)
EOS ABS: 0 10*3/uL (ref 0.0–0.5)
EOS%: 0.6 % (ref 0.0–7.0)
HEMATOCRIT: 29.2 % — AB (ref 34.8–46.6)
HEMOGLOBIN: 9.2 g/dL — AB (ref 11.6–15.9)
LYMPH%: 76 % — ABNORMAL HIGH (ref 14.0–49.7)
MCH: 29.5 pg (ref 25.1–34.0)
MCHC: 31.5 g/dL (ref 31.5–36.0)
MCV: 93.8 fL (ref 79.5–101.0)
MONO#: 0.1 10*3/uL (ref 0.1–0.9)
MONO%: 1.9 % (ref 0.0–14.0)
NEUT%: 20.6 % — AB (ref 38.4–76.8)
NEUTROS ABS: 1.2 10*3/uL — AB (ref 1.5–6.5)
Platelets: 49 10*3/uL — ABNORMAL LOW (ref 145–400)
RBC: 3.11 10*6/uL — ABNORMAL LOW (ref 3.70–5.45)
RDW: 22.9 % — AB (ref 11.2–14.5)
WBC: 5.9 10*3/uL (ref 3.9–10.3)
lymph#: 4.4 10*3/uL — ABNORMAL HIGH (ref 0.9–3.3)

## 2014-03-12 LAB — TECHNOLOGIST REVIEW

## 2014-03-12 LAB — HOLD TUBE, BLOOD BANK

## 2014-03-12 NOTE — Telephone Encounter (Signed)
I reviewed her case at the hematology tumor board this morning. 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 I delivered the bad news. She would like to talk to her husband further about plan of care.

## 2014-03-12 NOTE — Telephone Encounter (Signed)
Husband left a message stating PCP prescribed K, wants to know if it will be OK to take?  Dr Alvy Bimler office called PCP for results and prescription dose.

## 2014-03-13 ENCOUNTER — Telehealth: Payer: Self-pay | Admitting: Hematology and Oncology

## 2014-03-13 NOTE — Telephone Encounter (Signed)
I spoke with her husband in great detail today regarding her diagnosis, treatment options and prognosis. I addressed all questions.

## 2014-03-18 IMAGING — CR DG CERVICAL SPINE 2 OR 3 VIEWS
5 series · 5 of 5 positions shown · non-contrast
Comparison: None.

CLINICAL DATA: Followup C1-2 fractures.

CERVICAL SPINE - 2-3 VIEW

[w c-spine lat (1 of 2)]
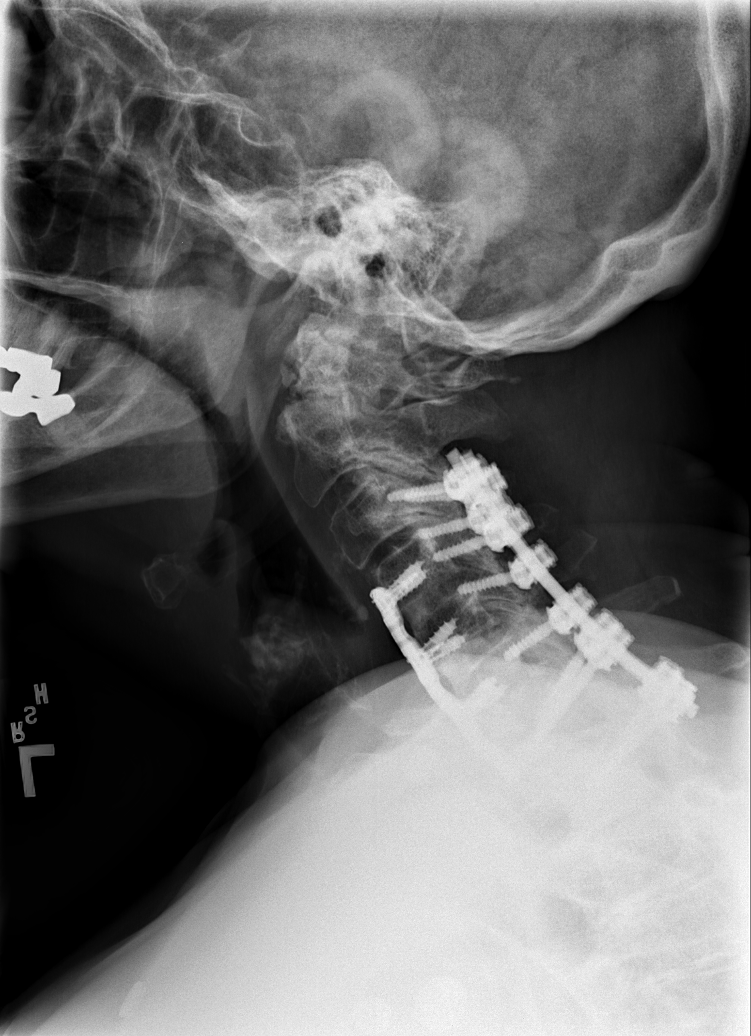

[w c-spine lat (2 of 2)]
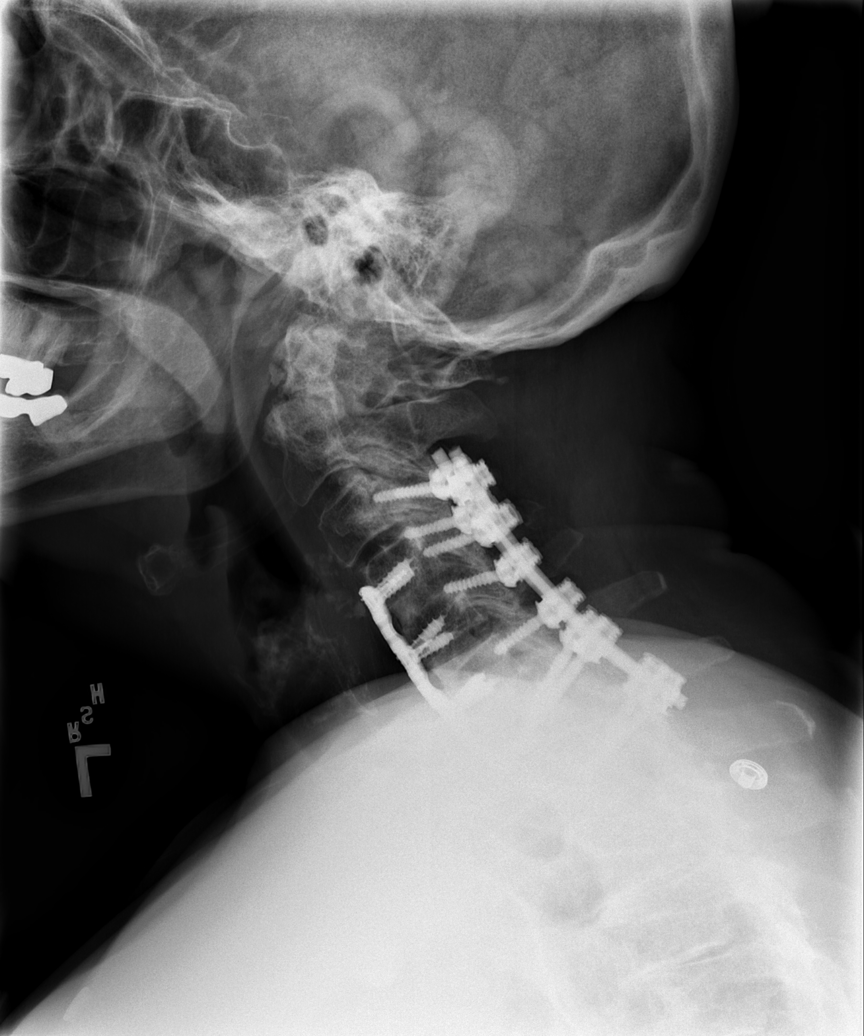

[w c-spine a.p.]
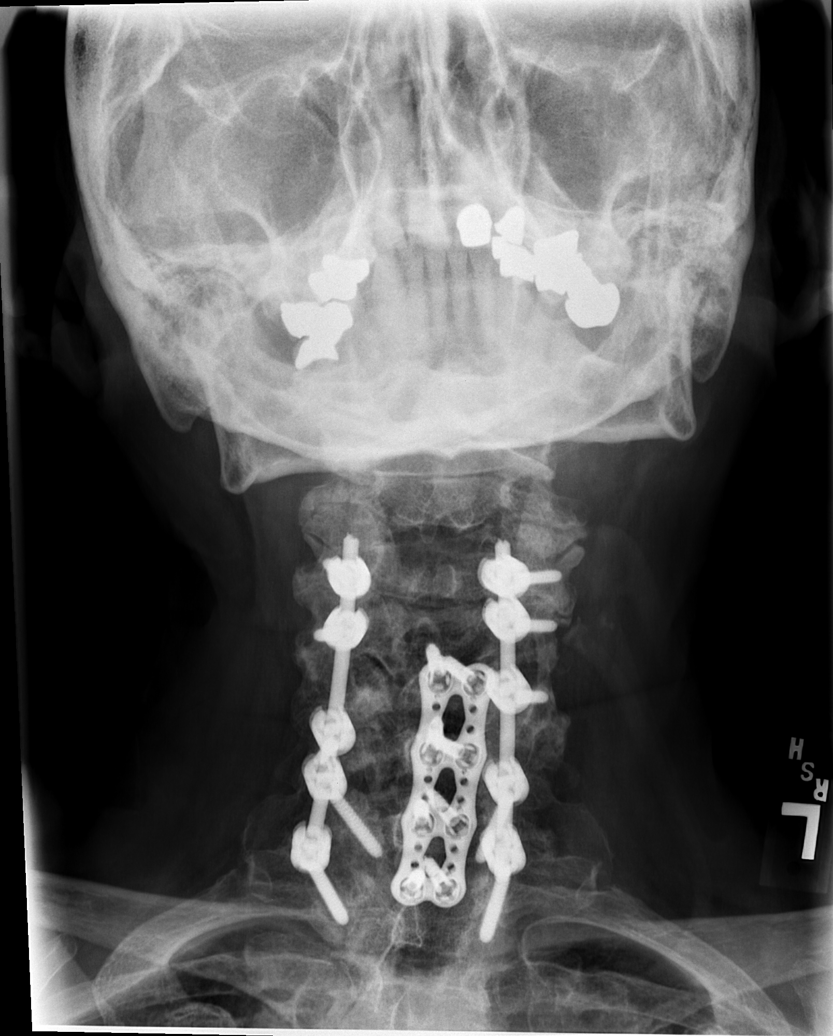

[w c-spine odontoid]
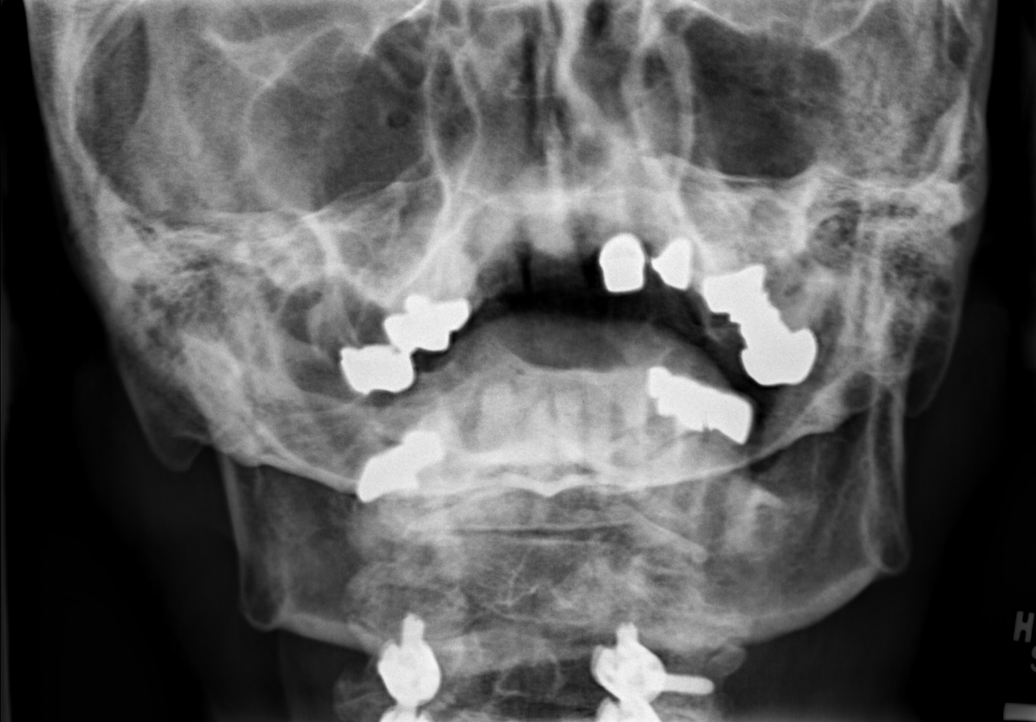

[w swimmers view *]
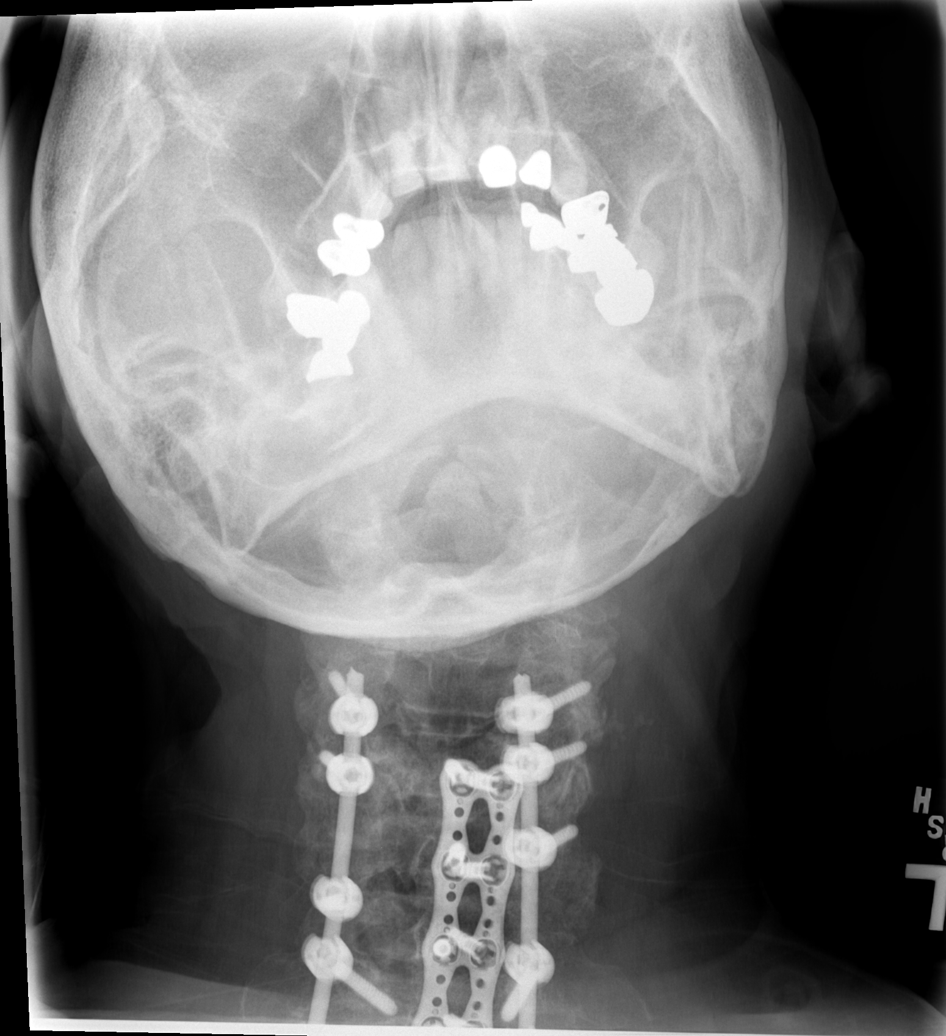

[5 of 5 positions shown; findings below may reference images not displayed]

FINDINGS: Previous anterior cervical disc fusions are seen from
levels of C4-C7, and posterior pedicle screws and fixation rods are
seen extending from levels of C3-T1.

Fracture of the base of the odontoid process is seen with mild
dorsal displacement and angulation.  Anterior arch of C1 also
appears fractured, without evidence of atlantoaxial subluxation.
IMPRESSION: 1.  Odontoid fracture with mild dorsal displacement and angulation.
2.  Fracture of the anterior pressures C1, without evidence of
atlantoaxial subluxation.

## 2014-03-19 ENCOUNTER — Telehealth: Payer: Self-pay | Admitting: *Deleted

## 2014-03-19 ENCOUNTER — Other Ambulatory Visit (HOSPITAL_BASED_OUTPATIENT_CLINIC_OR_DEPARTMENT_OTHER): Payer: Medicare Other

## 2014-03-19 DIAGNOSIS — R609 Edema, unspecified: Secondary | ICD-10-CM

## 2014-03-19 DIAGNOSIS — C91Z0 Other lymphoid leukemia not having achieved remission: Secondary | ICD-10-CM

## 2014-03-19 DIAGNOSIS — D630 Anemia in neoplastic disease: Secondary | ICD-10-CM

## 2014-03-19 DIAGNOSIS — D6959 Other secondary thrombocytopenia: Secondary | ICD-10-CM

## 2014-03-19 LAB — COMPREHENSIVE METABOLIC PANEL (CC13)
ALT: 53 U/L (ref 0–55)
AST: 74 U/L — ABNORMAL HIGH (ref 5–34)
Albumin: 3.3 g/dL — ABNORMAL LOW (ref 3.5–5.0)
Alkaline Phosphatase: 118 U/L (ref 40–150)
Anion Gap: 11 meq/L (ref 3–11)
BUN: 10.5 mg/dL (ref 7.0–26.0)
CO2: 29 meq/L (ref 22–29)
Calcium: 9.4 mg/dL (ref 8.4–10.4)
Chloride: 100 meq/L (ref 98–109)
Creatinine: 0.9 mg/dL (ref 0.6–1.1)
Glucose: 100 mg/dL (ref 70–140)
Potassium: 3 meq/L — CL (ref 3.5–5.1)
Sodium: 140 meq/L (ref 136–145)
Total Bilirubin: 2.85 mg/dL — ABNORMAL HIGH (ref 0.20–1.20)
Total Protein: 5.5 g/dL — ABNORMAL LOW (ref 6.4–8.3)

## 2014-03-19 LAB — CBC WITH DIFFERENTIAL/PLATELET
BASO%: 1.2 % (ref 0.0–2.0)
Basophils Absolute: 0.1 10*3/uL (ref 0.0–0.1)
EOS%: 0.8 % (ref 0.0–7.0)
Eosinophils Absolute: 0.1 10*3/uL (ref 0.0–0.5)
HCT: 28 % — ABNORMAL LOW (ref 34.8–46.6)
HGB: 8.9 g/dL — ABNORMAL LOW (ref 11.6–15.9)
LYMPH#: 5.3 10*3/uL — AB (ref 0.9–3.3)
LYMPH%: 74.3 % — ABNORMAL HIGH (ref 14.0–49.7)
MCH: 29.1 pg (ref 25.1–34.0)
MCHC: 31.6 g/dL (ref 31.5–36.0)
MCV: 92.2 fL (ref 79.5–101.0)
MONO#: 0.1 10*3/uL (ref 0.1–0.9)
MONO%: 1.4 % (ref 0.0–14.0)
NEUT#: 1.6 10*3/uL (ref 1.5–6.5)
NEUT%: 22.3 % — ABNORMAL LOW (ref 38.4–76.8)
Platelets: 66 10*3/uL — ABNORMAL LOW (ref 145–400)
RBC: 3.04 10*6/uL — ABNORMAL LOW (ref 3.70–5.45)
RDW: 22.2 % — ABNORMAL HIGH (ref 11.2–14.5)
WBC: 7.2 10*3/uL (ref 3.9–10.3)

## 2014-03-19 LAB — LACTATE DEHYDROGENASE (CC13): LDH: 305 U/L — ABNORMAL HIGH (ref 125–245)

## 2014-03-19 LAB — HOLD TUBE, BLOOD BANK

## 2014-03-19 LAB — TECHNOLOGIST REVIEW

## 2014-03-19 NOTE — Telephone Encounter (Signed)
Informed pt of labs improved she is currently taking prednisone 2.5 mg BID.  Instructed she can decrease dose to 2.5 mg once daily per Dr. Alvy Bimler.  Pt taking KCL once daily.  Instructed her to increase to twice daily as prescribed.  Her K+ is low at 3.0.  Pt verbalized understanding of above. Pt asks for refill on Cipro.  She says she thought Dr. Alvy Bimler told her she was going to have to take it for the rest of her life.

## 2014-03-19 NOTE — Telephone Encounter (Signed)
Informed pt she does not need to resume cipro per Dr. Alvy Bimler. She verbalized understanding.

## 2014-03-19 NOTE — Telephone Encounter (Signed)
Did I put her on cipro? Was not on it in my last note.

## 2014-03-27 ENCOUNTER — Other Ambulatory Visit (HOSPITAL_BASED_OUTPATIENT_CLINIC_OR_DEPARTMENT_OTHER): Payer: Medicare Other

## 2014-03-27 ENCOUNTER — Encounter: Payer: Self-pay | Admitting: Hematology and Oncology

## 2014-03-27 ENCOUNTER — Ambulatory Visit (HOSPITAL_BASED_OUTPATIENT_CLINIC_OR_DEPARTMENT_OTHER): Payer: Medicare Other | Admitting: Hematology and Oncology

## 2014-03-27 ENCOUNTER — Other Ambulatory Visit: Payer: Self-pay | Admitting: Hematology and Oncology

## 2014-03-27 ENCOUNTER — Telehealth: Payer: Self-pay | Admitting: Hematology and Oncology

## 2014-03-27 ENCOUNTER — Telehealth: Payer: Self-pay | Admitting: *Deleted

## 2014-03-27 VITALS — BP 108/53 | HR 92 | Temp 97.9°F | Resp 17 | Ht 62.0 in | Wt 196.0 lb

## 2014-03-27 DIAGNOSIS — C91Z0 Other lymphoid leukemia not having achieved remission: Secondary | ICD-10-CM

## 2014-03-27 DIAGNOSIS — C8449 Peripheral T-cell lymphoma, not classified, extranodal and solid organ sites: Secondary | ICD-10-CM | POA: Insufficient documentation

## 2014-03-27 DIAGNOSIS — R945 Abnormal results of liver function studies: Secondary | ICD-10-CM

## 2014-03-27 DIAGNOSIS — D630 Anemia in neoplastic disease: Secondary | ICD-10-CM

## 2014-03-27 DIAGNOSIS — R609 Edema, unspecified: Secondary | ICD-10-CM

## 2014-03-27 DIAGNOSIS — R1013 Epigastric pain: Secondary | ICD-10-CM

## 2014-03-27 DIAGNOSIS — R634 Abnormal weight loss: Secondary | ICD-10-CM

## 2014-03-27 DIAGNOSIS — D6959 Other secondary thrombocytopenia: Secondary | ICD-10-CM

## 2014-03-27 DIAGNOSIS — T50905A Adverse effect of unspecified drugs, medicaments and biological substances, initial encounter: Secondary | ICD-10-CM

## 2014-03-27 DIAGNOSIS — R7989 Other specified abnormal findings of blood chemistry: Secondary | ICD-10-CM

## 2014-03-27 HISTORY — DX: Epigastric pain: R10.13

## 2014-03-27 LAB — CBC WITH DIFFERENTIAL/PLATELET
BASO%: 0.9 % (ref 0.0–2.0)
BASOS ABS: 0.1 10*3/uL (ref 0.0–0.1)
EOS%: 0.9 % (ref 0.0–7.0)
Eosinophils Absolute: 0.1 10*3/uL (ref 0.0–0.5)
HEMATOCRIT: 31.8 % — AB (ref 34.8–46.6)
HEMOGLOBIN: 10 g/dL — AB (ref 11.6–15.9)
LYMPH%: 79.6 % — ABNORMAL HIGH (ref 14.0–49.7)
MCH: 28.9 pg (ref 25.1–34.0)
MCHC: 31.4 g/dL — ABNORMAL LOW (ref 31.5–36.0)
MCV: 92.1 fL (ref 79.5–101.0)
MONO#: 0.4 10*3/uL (ref 0.1–0.9)
MONO%: 2.6 % (ref 0.0–14.0)
NEUT#: 2.2 10*3/uL (ref 1.5–6.5)
NEUT%: 16 % — AB (ref 38.4–76.8)
Platelets: 69 10*3/uL — ABNORMAL LOW (ref 145–400)
RBC: 3.45 10*6/uL — ABNORMAL LOW (ref 3.70–5.45)
RDW: 22.1 % — ABNORMAL HIGH (ref 11.2–14.5)
WBC: 14 10*3/uL — ABNORMAL HIGH (ref 3.9–10.3)
lymph#: 11.2 10*3/uL — ABNORMAL HIGH (ref 0.9–3.3)

## 2014-03-27 LAB — COMPREHENSIVE METABOLIC PANEL (CC13)
ALT: 83 U/L — ABNORMAL HIGH (ref 0–55)
AST: 139 U/L — ABNORMAL HIGH (ref 5–34)
Albumin: 2.9 g/dL — ABNORMAL LOW (ref 3.5–5.0)
Alkaline Phosphatase: 160 U/L — ABNORMAL HIGH (ref 40–150)
Anion Gap: 12 mEq/L — ABNORMAL HIGH (ref 3–11)
BUN: 12.1 mg/dL (ref 7.0–26.0)
CALCIUM: 9.5 mg/dL (ref 8.4–10.4)
CHLORIDE: 102 meq/L (ref 98–109)
CO2: 28 meq/L (ref 22–29)
CREATININE: 1 mg/dL (ref 0.6–1.1)
Glucose: 87 mg/dl (ref 70–140)
Potassium: 3.5 mEq/L (ref 3.5–5.1)
Sodium: 141 mEq/L (ref 136–145)
Total Bilirubin: 3.24 mg/dL — ABNORMAL HIGH (ref 0.20–1.20)
Total Protein: 5 g/dL — ABNORMAL LOW (ref 6.4–8.3)

## 2014-03-27 LAB — TECHNOLOGIST REVIEW

## 2014-03-27 LAB — HOLD TUBE, BLOOD BANK

## 2014-03-27 MED ORDER — PREDNISONE 5 MG PO TABS: 5.0000 mg | ORAL_TABLET | Freq: Two times a day (BID) | ORAL | Status: AC

## 2014-03-27 MED ORDER — OXYCODONE HCL 5 MG PO TABS: 5.0000 mg | ORAL_TABLET | ORAL | Status: AC | PRN

## 2014-03-27 NOTE — Assessment & Plan Note (Signed)
This is likely due to her untreated cancer. I recommend pain management to control her pain.

## 2014-03-27 NOTE — Assessment & Plan Note (Signed)
Since discontinuation of methotrexate, her platelet count is slightly better. I suspect this is due to bone marrow infiltration from LGL and liver disease. She does not need transfusions of platelets now. We'll monitor her blood counts carefully.

## 2014-03-27 NOTE — Assessment & Plan Note (Signed)
The cause of the anemia is due to bone marrow infiltration and component of anemia of chronic disease. I will observe for now. I would transfuse her with blood if hemoglobin dropped to less than 8 g. She needs irradiated blood products.

## 2014-03-27 NOTE — Telephone Encounter (Signed)
Husband reports pt has eight 2.5 mg prednisone left.  He requests rx for the 5 mg tablets sent to [facility].

## 2014-03-27 NOTE — Telephone Encounter (Signed)
Done I prescribed 5 mg BID

## 2014-03-27 NOTE — Assessment & Plan Note (Signed)
She has anorexia and progressive weight loss coinciding with the time of prednisone taper. I recommended increasing prednisone back to 5 mg twice a day.

## 2014-03-27 NOTE — Telephone Encounter (Signed)
Gave avs & cal for Nov.  °

## 2014-03-27 NOTE — Assessment & Plan Note (Signed)
She has very poor declining performance status and is not a candidate for chemotherapy. I recommend supportive care with pain management and increase her prednisone back to 5 mg twice a day. If her performance status does not improve, I would recommend hospice.

## 2014-03-27 NOTE — Progress Notes (Signed)
[place] OFFICE PROGRESS NOTE  Patient Care Team: Madelyn Brunner, MD as PCP - General (Unknown Physician Specialty) Heath Lark, MD as Consulting Physician (Hematology and Oncology) Rollene Rotunda, MD as Consulting Physician (Hematology and Oncology)  SUMMARY OF ONCOLOGIC HISTORY:  She was found to have anemia and was placed on observation. The cause of the anemia was thought to be related to chronic kidney disease. The patient also has history of Roux-en-Y gastric bypass and it was thought to be the contributing factor. 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 On 04/09/2013, she was started on prednisone 60 mg daily and methotrexate 7.5 mg once a week by mouth On 04/16/2013, she developed acute renal failure with hyperkalemia. Prednisone dose was reduced to 40 mg a day and methotrexate was placed on hold On 04/23/2013, acute renal failure was improving. The prednisone dose was reduced to 20 mg a day and would resume methotrexate at 7.5 mg once a week. On 04/27/2013, I reduced methotrexate to 5 mg once a week and prednisone dose was reduced to 10 mg alternate with 20 mg On 05/04/2013, I order one unit of blood transfusion. I The prednisone dose the same but reduced methotrexate to 2.5 mg once a week On 05/11/2013, I started to reduce prednisone dose further to 10 mg daily but to keep methotrexate at 2.5 mg once a week On 05/25/2013, I further reduce prednisone to 10 mg alternate with 7.5 mg and keep methotrexate at 2.5 mg once a week On 06/09/2013, and I further reduce prednisone to 5 mg daily and keep methotrexate at 2.5 mg once a week On 08/14/2013, I increase the methotrexate to 5 mg once a week and Prednisone at 5 mg daily. On 09/10/2013, I reduced prednisone to 5 mg alternate with 2.5 mg and keep methotrexate at 5 mg once a week. On 10/10/2013, I increased the methotrexate to 10 mg once a week  and The prednisone at 5 mg alternate with 2.5 mg. On 10/23/2013, she saw a hematologist at [facility] for second opinion. Further recommendations are pending. On 12/18/13: prednisone is tapered to 2.5 mg daily. Methotrexate at 10 mg per week. On 01/21/14: Prednisone was discontinued. She was taper off methotrexate. On 02/10/2014, methotrexate was reinitiated at 10 mg per week along with 10 mg prednisone. On 02/19/2014, methotrexate was placed on hold due to elevated liver function tests. On 02/21/2014, ultrasound abdomen showed cholecystitis. From 02/22/2014 to 02/28/2014, the patient was hospitalized for cholecystitis, gastric mass and underwent CT-guided biopsy which showed T cell lymphoma.  INTERVAL HISTORY: Please see below for problem oriented charting. Since the last I saw her, she is getting weak with poor appetite and progressive weight loss. She complains of epigastric pain and nausea. She complained of diffuse arthritis pain coinciding with the time of prednisone taper. She denies any shortness of breath. No recent vomiting or constipation. She has persistent bilateral lower extremity edema. REVIEW OF SYSTEMS:   Constitutional: Denies fevers, chills  Eyes: Denies blurriness of vision Ears, nose, mouth, throat, and face: Denies mucositis or sore throat Respiratory: Denies cough, dyspnea or wheezes Skin: Denies abnormal skin rashes Lymphatics: Denies new lymphadenopathy or easy bruising Neurological:Denies numbness, tingling or new weaknesses Behavioral/Psych: Mood is stable, no new changes  All other systems were reviewed with the patient and are negative.  I have reviewed the past medical history, past surgical history, social history and family history with the patient and  they are unchanged from previous note.  ALLERGIES:  is allergic to latex; penicillins; and sulfonamide derivatives.  MEDICATIONS:  Current Outpatient Prescriptions  Medication Sig Dispense Refill  .  acetaminophen-codeine (TYLENOL #3) 300-30 MG per tablet Take 1-2 tablets by mouth 4 (four) times daily as needed (headache).     Marland Kitchen allopurinol (ZYLOPRIM) 300 MG tablet Take 300 mg by mouth every morning.     . butalbital-acetaminophen-caffeine (FIORICET, ESGIC) 50-325-40 MG per tablet Take 1-2 tablets by mouth every 6 (six) hours as needed for headache (headache).    . Calcium Carbonate-Vitamin D (CALCIUM 600+D) 600-400 MG-UNIT per tablet Take 1 tablet by mouth daily.    . cholecalciferol (VITAMIN D) 1000 UNITS tablet Take 1,000 Units by mouth 2 (two) times daily.    . citalopram (CELEXA) 20 MG tablet Take 20 mg by mouth every morning.     . Cyanocobalamin (VITAMIN B-12 SL) Place 1 tablet under the tongue daily.    . folic acid (FOLVITE) 1 MG tablet Take 1 tablet (1 mg total) by mouth daily. 90 tablet 3  . levothyroxine (SYNTHROID, LEVOTHROID) 75 MCG tablet Take 75 mcg by mouth daily before breakfast.     . Multiple Vitamin (MULTIVITAMIN WITH MINERALS) TABS Take 1 tablet by mouth daily. Centrum    . ondansetron (ZOFRAN) 8 MG tablet Take 8 mg by mouth every 8 (eight) hours as needed for nausea or vomiting (nausea).    Marland Kitchen oxybutynin (DITROPAN) 5 MG tablet Take 5 mg by mouth Daily.    . pantoprazole (PROTONIX) 40 MG tablet Take 1 tablet (40 mg total) by mouth 2 (two) times daily. 60 tablet 2  . potassium chloride SA (K-DUR,KLOR-CON) 20 MEQ tablet Take 20 mEq by mouth 2 (two) times daily.    . predniSONE (DELTASONE) 2.5 MG tablet Take 1 tablet (2.5 mg total) by mouth 2 (two) times daily with a meal. (Patient taking differently: Take 2.5 mg by mouth daily with breakfast. ) 60 tablet 3  . fluticasone (FLONASE) 50 MCG/ACT nasal spray Place 3 sprays into the nose daily.    Marland Kitchen oxyCODONE (OXY IR/ROXICODONE) 5 MG immediate release tablet Take 1 tablet (5 mg total) by mouth every 4 (four) hours as needed for severe pain. 60 tablet 0  . predniSONE (DELTASONE) 5 MG tablet Take 1 tablet (5 mg total) by mouth 2 (two)  times daily with a meal. 90 tablet 3   No current facility-administered medications for this visit.    PHYSICAL EXAMINATION: ECOG PERFORMANCE STATUS: 3 - Symptomatic, >50% confined to bed  Filed Vitals:   03/27/14 1113  BP: 108/53  Pulse: 92  Temp: 97.9 F (36.6 C)  Resp: 17   Filed Weights   03/27/14 1113  Weight: 196 lb (88.905 kg)    GENERAL:alert, no distress and comfortable. She looks debilitated and weak. She has lost a lot of weight SKIN: skin color, texture, turgor are normal, no rashes or significant lesions EYES: normal, Conjunctiva are pink and non-injected, sclera clear OROPHARYNX:no exudate, no erythema and lips, buccal mucosa, and tongue normal  NECK: supple, thyroid normal size, non-tender, without nodularity LYMPH:  no palpable lymphadenopathy in the cervical, axillary or inguinal LUNGS: clear to auscultation and percussion with normal breathing effort HEART: regular rate & rhythm and no murmurs with moderate bilateral lower extremity edema ABDOMEN:abdomen soft, mild tenderness in the epigastrium. Musculoskeletal:no cyanosis of digits and no clubbing  NEURO: alert & oriented x 3 with fluent speech, no focal motor/sensory deficits  LABORATORY DATA:  I have reviewed the data as listed    Component Value Date/Time   NA 141 03/27/2014 1053   NA 142 02/28/2014 0430   K 3.5 03/27/2014 1053   K 3.3* 02/28/2014 0430   CL 105 02/28/2014 0430   CL 105 10/25/2012 1135   CO2 28 03/27/2014 1053   CO2 27 02/28/2014 0430   GLUCOSE 87 03/27/2014 1053   GLUCOSE 86 02/28/2014 0430   GLUCOSE 85 10/25/2012 1135   BUN 12.1 03/27/2014 1053   BUN 18 02/28/2014 0430   CREATININE 1.0 03/27/2014 1053   CREATININE 0.98 02/28/2014 0430   CREATININE 1.22* 09/08/2011 1120   CALCIUM 9.5 03/27/2014 1053   CALCIUM 8.7 02/28/2014 0430   PROT 5.0* 03/27/2014 1053   PROT 5.0* 02/27/2014 0544   ALBUMIN 2.9* 03/27/2014 1053   ALBUMIN 2.9* 02/27/2014 0544   AST 139* 03/27/2014  1053   AST 31 02/27/2014 0544   ALT 83* 03/27/2014 1053   ALT 22 02/27/2014 0544   ALKPHOS 160* 03/27/2014 1053   ALKPHOS 92 02/27/2014 0544   BILITOT 3.24* 03/27/2014 1053   BILITOT 2.2* 02/27/2014 0544   GFRNONAA 57* 02/28/2014 0430   GFRAA 66* 02/28/2014 0430    No results found for: SPEP, UPEP  Lab Results  Component Value Date   WBC 14.0* 03/27/2014   NEUTROABS 2.2 03/27/2014   HGB 10.0* 03/27/2014   HCT 31.8* 03/27/2014   MCV 92.1 03/27/2014   PLT 69* 03/27/2014      Chemistry      Component Value Date/Time   NA 141 03/27/2014 1053   NA 142 02/28/2014 0430   K 3.5 03/27/2014 1053   K 3.3* 02/28/2014 0430   CL 105 02/28/2014 0430   CL 105 10/25/2012 1135   CO2 28 03/27/2014 1053   CO2 27 02/28/2014 0430   BUN 12.1 03/27/2014 1053   BUN 18 02/28/2014 0430   CREATININE 1.0 03/27/2014 1053   CREATININE 0.98 02/28/2014 0430   CREATININE 1.22* 09/08/2011 1120      Component Value Date/Time   CALCIUM 9.5 03/27/2014 1053   CALCIUM 8.7 02/28/2014 0430   ALKPHOS 160* 03/27/2014 1053   ALKPHOS 92 02/27/2014 0544   AST 139* 03/27/2014 1053   AST 31 02/27/2014 0544   ALT 83* 03/27/2014 1053   ALT 22 02/27/2014 0544   BILITOT 3.24* 03/27/2014 1053   BILITOT 2.2* 02/27/2014 0544     ASSESSMENT & PLAN:  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 I recommend supportive care with pain management and increase her prednisone back to 5 mg twice a day. If her performance status does not improve, I would recommend hospice.  Anemia in neoplastic disease The cause of the anemia is due to bone marrow infiltration and component of anemia of chronic disease. I will observe for now. I would transfuse her with blood if hemoglobin dropped to less than 8 g. She needs irradiated blood products.  Thrombocytopenia due to drugs Since discontinuation of methotrexate, her  platelet count is slightly better. I suspect this is due to bone marrow infiltration from LGL and liver disease. She does not need transfusions of platelets now. We'll monitor her blood counts carefully.     Abnormal liver function tests Her liver function tests is worse and clinically she is jaundiced. She has poor appetite. It could be due to persistent liver disease, persistent cholecystitis versus metastatic cancer  to the liver. I recommend observation for now. She is not a candidate for cholecystectomy.  Weight loss She has anorexia and progressive weight loss coinciding with the time of prednisone taper. I recommended increasing prednisone back to 5 mg twice a day.  Epigastric abdominal pain This is likely due to her untreated cancer. I recommend pain management to control her pain.   No orders of the defined types were placed in this encounter.   All questions were answered. The patient knows to call the clinic with any problems, questions or concerns. No barriers to learning was detected. I spent 30 minutes counseling the patient face to face. The total time spent in the appointment was 40 minutes and more than 50% was on counseling and review of test results     [facility], [place], MD 03/27/2014 3:35 PM

## 2014-03-27 NOTE — Assessment & Plan Note (Signed)
Her liver function tests is worse and clinically she is jaundiced. She has poor appetite. It could be due to persistent liver disease, persistent cholecystitis versus metastatic cancer to the liver. I recommend observation for now. She is not a candidate for cholecystectomy.

## 2014-04-09 ENCOUNTER — Telehealth: Payer: Self-pay | Admitting: Hematology and Oncology

## 2014-04-09 ENCOUNTER — Other Ambulatory Visit: Payer: Self-pay | Admitting: *Deleted

## 2014-04-09 ENCOUNTER — Other Ambulatory Visit (HOSPITAL_BASED_OUTPATIENT_CLINIC_OR_DEPARTMENT_OTHER): Payer: Medicare Other

## 2014-04-09 ENCOUNTER — Ambulatory Visit (HOSPITAL_BASED_OUTPATIENT_CLINIC_OR_DEPARTMENT_OTHER): Payer: Medicare Other | Admitting: Hematology and Oncology

## 2014-04-09 VITALS — BP 109/57 | HR 85 | Temp 98.3°F | Resp 18 | Ht 62.0 in | Wt 206.1 lb

## 2014-04-09 DIAGNOSIS — L89511 Pressure ulcer of right ankle, stage 1: Secondary | ICD-10-CM

## 2014-04-09 DIAGNOSIS — D6959 Other secondary thrombocytopenia: Secondary | ICD-10-CM

## 2014-04-09 DIAGNOSIS — R7989 Other specified abnormal findings of blood chemistry: Secondary | ICD-10-CM

## 2014-04-09 DIAGNOSIS — C8449 Peripheral T-cell lymphoma, not classified, extranodal and solid organ sites: Secondary | ICD-10-CM

## 2014-04-09 DIAGNOSIS — T50905A Adverse effect of unspecified drugs, medicaments and biological substances, initial encounter: Secondary | ICD-10-CM

## 2014-04-09 DIAGNOSIS — D630 Anemia in neoplastic disease: Secondary | ICD-10-CM

## 2014-04-09 DIAGNOSIS — L89501 Pressure ulcer of unspecified ankle, stage 1: Secondary | ICD-10-CM | POA: Insufficient documentation

## 2014-04-09 DIAGNOSIS — C91Z0 Other lymphoid leukemia not having achieved remission: Secondary | ICD-10-CM

## 2014-04-09 DIAGNOSIS — R609 Edema, unspecified: Secondary | ICD-10-CM

## 2014-04-09 DIAGNOSIS — R945 Abnormal results of liver function studies: Secondary | ICD-10-CM

## 2014-04-09 DIAGNOSIS — L89301 Pressure ulcer of unspecified buttock, stage 1: Secondary | ICD-10-CM

## 2014-04-09 DIAGNOSIS — L89311 Pressure ulcer of right buttock, stage 1: Secondary | ICD-10-CM

## 2014-04-09 LAB — COMPREHENSIVE METABOLIC PANEL (CC13)
ALT: 59 U/L — AB (ref 0–55)
AST: 76 U/L — ABNORMAL HIGH (ref 5–34)
Albumin: 2.9 g/dL — ABNORMAL LOW (ref 3.5–5.0)
Alkaline Phosphatase: 138 U/L (ref 40–150)
Anion Gap: 8 mEq/L (ref 3–11)
BILIRUBIN TOTAL: 2.77 mg/dL — AB (ref 0.20–1.20)
BUN: 16.1 mg/dL (ref 7.0–26.0)
CHLORIDE: 107 meq/L (ref 98–109)
CO2: 23 mEq/L (ref 22–29)
Calcium: 9.2 mg/dL (ref 8.4–10.4)
Creatinine: 0.9 mg/dL (ref 0.6–1.1)
Glucose: 79 mg/dl (ref 70–140)
Potassium: 4.4 mEq/L (ref 3.5–5.1)
SODIUM: 139 meq/L (ref 136–145)
TOTAL PROTEIN: 5 g/dL — AB (ref 6.4–8.3)

## 2014-04-09 LAB — CBC WITH DIFFERENTIAL/PLATELET
BASO%: 1.1 % (ref 0.0–2.0)
Basophils Absolute: 0.1 10*3/uL (ref 0.0–0.1)
EOS%: 1 % (ref 0.0–7.0)
Eosinophils Absolute: 0.1 10*3/uL (ref 0.0–0.5)
HCT: 27.9 % — ABNORMAL LOW (ref 34.8–46.6)
HGB: 8.7 g/dL — ABNORMAL LOW (ref 11.6–15.9)
LYMPH%: 70.5 % — AB (ref 14.0–49.7)
MCH: 29 pg (ref 25.1–34.0)
MCHC: 31.2 g/dL — AB (ref 31.5–36.0)
MCV: 93.1 fL (ref 79.5–101.0)
MONO#: 0.2 10*3/uL (ref 0.1–0.9)
MONO%: 3.3 % (ref 0.0–14.0)
NEUT#: 1.9 10*3/uL (ref 1.5–6.5)
NEUT%: 24.1 % — ABNORMAL LOW (ref 38.4–76.8)
Platelets: 56 10*3/uL — ABNORMAL LOW (ref 145–400)
RBC: 3 10*6/uL — AB (ref 3.70–5.45)
RDW: 22.1 % — AB (ref 11.2–14.5)
WBC: 7.7 10*3/uL (ref 3.9–10.3)
lymph#: 5.4 10*3/uL — ABNORMAL HIGH (ref 0.9–3.3)

## 2014-04-09 LAB — HOLD TUBE, BLOOD BANK

## 2014-04-09 LAB — TECHNOLOGIST REVIEW

## 2014-04-09 NOTE — Assessment & Plan Note (Signed)
She has anorexia and progressive weight loss coinciding with the time of prednisone taper. I recommended increasing prednisone back to 5 mg twice a day. Since then, anorexia has improved. We will continue same dose.

## 2014-04-09 NOTE — Telephone Encounter (Signed)
Gave avs & cal for Dec. °

## 2014-04-09 NOTE — Progress Notes (Signed)
[place] OFFICE PROGRESS NOTE  Patient Care Team: Madelyn Brunner, MD as PCP - General (Unknown Physician Specialty) Heath Lark, MD as Consulting Physician (Hematology and Oncology) Rollene Rotunda, MD as Consulting Physician (Hematology and Oncology)  SUMMARY OF ONCOLOGIC HISTORY: She was found to have anemia and was placed on observation. The cause of the anemia was thought to be related to chronic kidney disease. The patient also has history of Roux-en-Y gastric bypass and it was thought to be the contributing factor. 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 On 04/09/2013, she was started on prednisone 60 mg daily and methotrexate 7.5 mg once a week by mouth On 04/16/2013, she developed acute renal failure with hyperkalemia. Prednisone dose was reduced to 40 mg a day and methotrexate was placed on hold On 04/23/2013, acute renal failure was improving. The prednisone dose was reduced to 20 mg a day and would resume methotrexate at 7.5 mg once a week. On 04/27/2013, I reduced methotrexate to 5 mg once a week and prednisone dose was reduced to 10 mg alternate with 20 mg On 05/04/2013, I order one unit of blood transfusion. I The prednisone dose the same but reduced methotrexate to 2.5 mg once a week On 05/11/2013, I started to reduce prednisone dose further to 10 mg daily but to keep methotrexate at 2.5 mg once a week On 05/25/2013, I further reduce prednisone to 10 mg alternate with 7.5 mg and keep methotrexate at 2.5 mg once a week On 06/09/2013, and I further reduce prednisone to 5 mg daily and keep methotrexate at 2.5 mg once a week On 08/14/2013, I increase the methotrexate to 5 mg once a week and Prednisone at 5 mg daily. On 09/10/2013, I reduced prednisone to 5 mg alternate with 2.5 mg and keep methotrexate at 5 mg once a week. On 10/10/2013, I increased the methotrexate to 10 mg once a week and  The prednisone at 5 mg alternate with 2.5 mg. On 10/23/2013, she saw a hematologist at [facility] for second opinion. Further recommendations are pending. On 12/18/13: prednisone is tapered to 2.5 mg daily. Methotrexate at 10 mg per week. On 01/21/14: Prednisone was discontinued. She was taper off methotrexate. On 02/10/2014, methotrexate was reinitiated at 10 mg per week along with 10 mg prednisone. On 02/19/2014, methotrexate was placed on hold due to elevated liver function tests. On 02/21/2014, ultrasound abdomen showed cholecystitis. From 02/22/2014 to 02/28/2014, the patient was hospitalized for cholecystitis, gastric mass and underwent CT-guided biopsy which showed T cell lymphoma.  INTERVAL HISTORY: Please see below for problem oriented charting. She felt better since the increase prednisone to 5 mg twice a day. However, due to poor mobility, she have new right buttock decubitus ulcer. She continues to have poor mobility due to weakness and bilateral leg edema. She had mild nausea but no vomiting. She has chronic headaches, unchanged.  REVIEW OF SYSTEMS:   Constitutional: Denies fevers, chills or abnormal weight loss Eyes: Denies blurriness of vision Ears, nose, mouth, throat, and face: Denies mucositis or sore throat Respiratory: Denies cough, dyspnea or wheezes Cardiovascular: Denies palpitation, chest discomfort  Lymphatics: Denies new lymphadenopathy or easy bruising Neurological:Denies numbness, tingling or new weaknesses Behavioral/Psych: Mood is stable, no new changes  All other systems were reviewed with the patient and are negative.  I have reviewed the past medical history, past surgical history, social history and family history with the patient and they  are unchanged from previous note.  ALLERGIES:  is allergic to latex; penicillins; and sulfonamide derivatives.  MEDICATIONS:  Current Outpatient Prescriptions  Medication Sig Dispense Refill  .  acetaminophen-codeine (TYLENOL #3) 300-30 MG per tablet Take 1-2 tablets by mouth 4 (four) times daily as needed (headache).     Marland Kitchen allopurinol (ZYLOPRIM) 300 MG tablet Take 300 mg by mouth every morning.     . butalbital-acetaminophen-caffeine (FIORICET, ESGIC) 50-325-40 MG per tablet Take 1-2 tablets by mouth every 6 (six) hours as needed for headache (headache).    . Calcium Carbonate-Vitamin D (CALCIUM 600+D) 600-400 MG-UNIT per tablet Take 1 tablet by mouth daily.    . cholecalciferol (VITAMIN D) 1000 UNITS tablet Take 1,000 Units by mouth 2 (two) times daily.    . citalopram (CELEXA) 20 MG tablet Take 20 mg by mouth every morning.     . Cyanocobalamin (VITAMIN B-12 SL) Place 1 tablet under the tongue daily.    . fluticasone (FLONASE) 50 MCG/ACT nasal spray Place 3 sprays into the nose daily.    . folic acid (FOLVITE) 1 MG tablet Take 1 tablet (1 mg total) by mouth daily. 90 tablet 3  . levothyroxine (SYNTHROID, LEVOTHROID) 75 MCG tablet Take 75 mcg by mouth daily before breakfast.     . Multiple Vitamin (MULTIVITAMIN WITH MINERALS) TABS Take 1 tablet by mouth daily. Centrum    . ondansetron (ZOFRAN) 8 MG tablet Take 8 mg by mouth every 8 (eight) hours as needed for nausea or vomiting (nausea).    Marland Kitchen oxybutynin (DITROPAN) 5 MG tablet Take 5 mg by mouth Daily.    Marland Kitchen oxyCODONE (OXY IR/ROXICODONE) 5 MG immediate release tablet Take 1 tablet (5 mg total) by mouth every 4 (four) hours as needed for severe pain. 60 tablet 0  . pantoprazole (PROTONIX) 40 MG tablet Take 1 tablet (40 mg total) by mouth 2 (two) times daily. 60 tablet 2  . potassium chloride SA (K-DUR,KLOR-CON) 20 MEQ tablet Take 20 mEq by mouth 2 (two) times daily.    . predniSONE (DELTASONE) 5 MG tablet Take 1 tablet (5 mg total) by mouth 2 (two) times daily with a meal. 90 tablet 3   No current facility-administered medications for this visit.    PHYSICAL EXAMINATION: ECOG PERFORMANCE STATUS: 3 - Symptomatic, >50% confined to  bed  Filed Vitals:   04/09/14 1115  BP: 109/57  Pulse: 85  Temp: 98.3 F (36.8 C)  Resp: 18   Filed Weights   04/09/14 1115  Weight: 206 lb 1.6 oz (93.486 kg)    GENERAL:alert, no distress and comfortable. She looks stronger than before.she is morbidly obese SKIN: notice stage I right buttock decubitus ulcer. EYES: normal, Conjunctiva are pink and non-injected, sclera clear OROPHARYNX:no exudate, no erythema and lips, buccal mucosa, and tongue normal  NECK: supple, thyroid normal size, non-tender, without nodularity LYMPH:  no palpable lymphadenopathy in the cervical, axillary or inguinal LUNGS: clear to auscultation and percussion with normal breathing effort HEART: regular rate & rhythm and no murmurs with severe bilateral lower extremity edema ABDOMEN:abdomen soft, non-tender and normal bowel sounds Musculoskeletal:no cyanosis of digits and no clubbing  NEURO: alert & oriented x 3 with fluent speech, no focal motor/sensory deficits  LABORATORY DATA:  I have reviewed the data as listed    Component Value Date/Time   NA 139 04/09/2014 1045   NA 142 02/28/2014 0430   K 4.4 04/09/2014 1045   K 3.3* 02/28/2014 0430   CL 105 02/28/2014 0430  CL 105 10/25/2012 1135   CO2 23 04/09/2014 1045   CO2 27 02/28/2014 0430   GLUCOSE 79 04/09/2014 1045   GLUCOSE 86 02/28/2014 0430   GLUCOSE 85 10/25/2012 1135   BUN 16.1 04/09/2014 1045   BUN 18 02/28/2014 0430   CREATININE 0.9 04/09/2014 1045   CREATININE 0.98 02/28/2014 0430   CREATININE 1.22* 09/08/2011 1120   CALCIUM 9.2 04/09/2014 1045   CALCIUM 8.7 02/28/2014 0430   PROT 5.0* 04/09/2014 1045   PROT 5.0* 02/27/2014 0544   ALBUMIN 2.9* 04/09/2014 1045   ALBUMIN 2.9* 02/27/2014 0544   AST 76* 04/09/2014 1045   AST 31 02/27/2014 0544   ALT 59* 04/09/2014 1045   ALT 22 02/27/2014 0544   ALKPHOS 138 04/09/2014 1045   ALKPHOS 92 02/27/2014 0544   BILITOT 2.77* 04/09/2014 1045   BILITOT 2.2* 02/27/2014 0544   GFRNONAA 57*  02/28/2014 0430   GFRAA 66* 02/28/2014 0430    No results found for: SPEP, UPEP  Lab Results  Component Value Date   WBC 7.7 04/09/2014   NEUTROABS 1.9 04/09/2014   HGB 8.7* 04/09/2014   HCT 27.9* 04/09/2014   MCV 93.1 04/09/2014   PLT 56* 04/09/2014      Chemistry      Component Value Date/Time   NA 139 04/09/2014 1045   NA 142 02/28/2014 0430   K 4.4 04/09/2014 1045   K 3.3* 02/28/2014 0430   CL 105 02/28/2014 0430   CL 105 10/25/2012 1135   CO2 23 04/09/2014 1045   CO2 27 02/28/2014 0430   BUN 16.1 04/09/2014 1045   BUN 18 02/28/2014 0430   CREATININE 0.9 04/09/2014 1045   CREATININE 0.98 02/28/2014 0430   CREATININE 1.22* 09/08/2011 1120      Component Value Date/Time   CALCIUM 9.2 04/09/2014 1045   CALCIUM 8.7 02/28/2014 0430   ALKPHOS 138 04/09/2014 1045   ALKPHOS 92 02/27/2014 0544   AST 76* 04/09/2014 1045   AST 31 02/27/2014 0544   ALT 59* 04/09/2014 1045   ALT 22 02/27/2014 0544   BILITOT 2.77* 04/09/2014 1045   BILITOT 2.2* 02/27/2014 0544      ASSESSMENT & PLAN:  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 I recommend supportive care with pain management and increase her prednisone back to 5 mg twice a day. She feels better but her performance status has not improved. I recommend home PT evaluation. The patient has not given up hope for chemotherapy and is not ready for hospice.  Abnormal liver function tests Her liver function tests is worse and clinically she is jaundiced. She has poor appetite. It could be due to persistent liver disease, persistent cholecystitis versus metastatic cancer to the liver. I recommend observation for now. She is not a candidate for cholecystectomy.    Anemia in neoplastic disease The cause of the anemia is due to bone marrow infiltration and component of anemia of chronic disease. I will observe for now. I  would transfuse her with blood if hemoglobin dropped to less than 8 g. She needs irradiated blood products.  Edema The cause is unclear.  Echocardiogram showed no evidence of congestive heart failure. I suspect this is due to poor venous circulation and possibly diastolic failure. In the meantime, she will continue on furosemide daily. I recommend ACE wrap and we'll consult home health agency.   Thrombocytopenia due to drugs She  has anorexia and progressive weight loss coinciding with the time of prednisone taper. I recommended increasing prednisone back to 5 mg twice a day. Since then, anorexia has improved. We will continue same dose.    Decubitus ulcer of buttock, stage 1 Continue dressing change and positional change. I will consult home health care agency to manage this.   No orders of the defined types were placed in this encounter.   All questions were answered. The patient knows to call the clinic with any problems, questions or concerns. No barriers to learning was detected. I spent 30 minutes counseling the patient face to face. The total time spent in the appointment was 40 minutes and more than 50% was on counseling and review of test results     [facility], [place], MD 04/09/2014 5:01 PM

## 2014-04-09 NOTE — Assessment & Plan Note (Signed)
Her liver function tests is worse and clinically she is jaundiced. She has poor appetite. It could be due to persistent liver disease, persistent cholecystitis versus metastatic cancer to the liver. I recommend observation for now. She is not a candidate for cholecystectomy.

## 2014-04-09 NOTE — Progress Notes (Signed)
Informed Lurlean Leyden, RN w/ [facility] of new order for home care and physical therapy.

## 2014-04-09 NOTE — Assessment & Plan Note (Signed)
Continue dressing change and positional change. I will consult home health care agency to manage this.

## 2014-04-09 NOTE — Assessment & Plan Note (Signed)
The cause is unclear.  Echocardiogram showed no evidence of congestive heart failure. I suspect this is due to poor venous circulation and possibly diastolic failure. In the meantime, she will continue on furosemide daily. I recommend ACE wrap and we'll consult home health agency.

## 2014-04-09 NOTE — Assessment & Plan Note (Signed)
The cause of the anemia is due to bone marrow infiltration and component of anemia of chronic disease. I will observe for now. I would transfuse her with blood if hemoglobin dropped to less than 8 g. She needs irradiated blood products.

## 2014-04-09 NOTE — Assessment & Plan Note (Signed)
She has very poor declining performance status and is not a candidate for chemotherapy. I recommend supportive care with pain management and increase her prednisone back to 5 mg twice a day. She feels better but her performance status has not improved. I recommend home PT evaluation. The patient has not given up hope for chemotherapy and is not ready for hospice.

## 2014-04-10 ENCOUNTER — Other Ambulatory Visit: Payer: Self-pay | Admitting: *Deleted

## 2014-04-10 MED ORDER — POTASSIUM CHLORIDE CRYS ER 20 MEQ PO TBCR: 20.0000 meq | EXTENDED_RELEASE_TABLET | Freq: Every day | ORAL | Status: AC

## 2014-04-22 ENCOUNTER — Telehealth: Payer: Self-pay | Admitting: *Deleted

## 2014-04-22 NOTE — Telephone Encounter (Signed)
Call from RN w/ [facility] requests verbal order for OT eval from Dr. Alvy Bimler.   Called Ms. Rosana Hoes, RN w/ [facility] at ph [phone] and gave V.O. From Dr. Alvy Bimler for OT eval and treat.

## 2014-04-23 ENCOUNTER — Telehealth: Payer: Self-pay | Admitting: *Deleted

## 2014-04-23 ENCOUNTER — Encounter: Payer: Self-pay | Admitting: Hematology and Oncology

## 2014-04-23 ENCOUNTER — Ambulatory Visit (HOSPITAL_BASED_OUTPATIENT_CLINIC_OR_DEPARTMENT_OTHER): Payer: Medicare Other | Admitting: Hematology and Oncology

## 2014-04-23 ENCOUNTER — Other Ambulatory Visit (HOSPITAL_BASED_OUTPATIENT_CLINIC_OR_DEPARTMENT_OTHER): Payer: Medicare Other

## 2014-04-23 VITALS — BP 98/60 | HR 95 | Temp 98.2°F | Resp 18 | Ht 62.0 in | Wt 206.8 lb

## 2014-04-23 DIAGNOSIS — R609 Edema, unspecified: Secondary | ICD-10-CM

## 2014-04-23 DIAGNOSIS — C91Z0 Other lymphoid leukemia not having achieved remission: Secondary | ICD-10-CM

## 2014-04-23 DIAGNOSIS — D630 Anemia in neoplastic disease: Secondary | ICD-10-CM

## 2014-04-23 DIAGNOSIS — K257 Chronic gastric ulcer without hemorrhage or perforation: Secondary | ICD-10-CM

## 2014-04-23 DIAGNOSIS — C8449 Peripheral T-cell lymphoma, not classified, extranodal and solid organ sites: Secondary | ICD-10-CM

## 2014-04-23 DIAGNOSIS — R945 Abnormal results of liver function studies: Secondary | ICD-10-CM

## 2014-04-23 DIAGNOSIS — R7989 Other specified abnormal findings of blood chemistry: Secondary | ICD-10-CM

## 2014-04-23 DIAGNOSIS — I9589 Other hypotension: Secondary | ICD-10-CM

## 2014-04-23 LAB — COMPREHENSIVE METABOLIC PANEL (CC13)
ALBUMIN: 2.7 g/dL — AB (ref 3.5–5.0)
ALT: 38 U/L (ref 0–55)
AST: 49 U/L — ABNORMAL HIGH (ref 5–34)
Alkaline Phosphatase: 114 U/L (ref 40–150)
Anion Gap: 7 mEq/L (ref 3–11)
BUN: 22.7 mg/dL (ref 7.0–26.0)
CO2: 25 mEq/L (ref 22–29)
Calcium: 9 mg/dL (ref 8.4–10.4)
Chloride: 105 mEq/L (ref 98–109)
Creatinine: 1 mg/dL (ref 0.6–1.1)
GLUCOSE: 102 mg/dL (ref 70–140)
POTASSIUM: 4.6 meq/L (ref 3.5–5.1)
Sodium: 137 mEq/L (ref 136–145)
Total Bilirubin: 3.04 mg/dL — ABNORMAL HIGH (ref 0.20–1.20)
Total Protein: 4.8 g/dL — ABNORMAL LOW (ref 6.4–8.3)

## 2014-04-23 LAB — CBC WITH DIFFERENTIAL/PLATELET
BASO%: 0.3 % (ref 0.0–2.0)
Basophils Absolute: 0 10*3/uL (ref 0.0–0.1)
EOS ABS: 0 10*3/uL (ref 0.0–0.5)
EOS%: 0 % (ref 0.0–7.0)
HCT: 27.4 % — ABNORMAL LOW (ref 34.8–46.6)
HEMOGLOBIN: 8.8 g/dL — AB (ref 11.6–15.9)
LYMPH%: 59.5 % — AB (ref 14.0–49.7)
MCH: 29.7 pg (ref 25.1–34.0)
MCHC: 32.1 g/dL (ref 31.5–36.0)
MCV: 92.6 fL (ref 79.5–101.0)
MONO#: 0.5 10*3/uL (ref 0.1–0.9)
MONO%: 15.3 % — ABNORMAL HIGH (ref 0.0–14.0)
NEUT%: 24.9 % — AB (ref 38.4–76.8)
NEUTROS ABS: 0.8 10*3/uL — AB (ref 1.5–6.5)
PLATELETS: 40 10*3/uL — AB (ref 145–400)
RBC: 2.96 10*6/uL — ABNORMAL LOW (ref 3.70–5.45)
RDW: 21.2 % — ABNORMAL HIGH (ref 11.2–14.5)
WBC: 3.2 10*3/uL — ABNORMAL LOW (ref 3.9–10.3)
lymph#: 1.9 10*3/uL (ref 0.9–3.3)

## 2014-04-23 LAB — TECHNOLOGIST REVIEW

## 2014-04-23 LAB — HOLD TUBE, BLOOD BANK

## 2014-04-23 NOTE — Assessment & Plan Note (Signed)
Her liver function tests is worse and clinically she is jaundiced. She has poor appetite. It could be due to persistent liver disease, persistent cholecystitis versus metastatic cancer to the liver. I recommend observation for now. She is not a candidate for cholecystectomy.

## 2014-04-23 NOTE — Assessment & Plan Note (Signed)
She is having chronic nausea daily especially after meals. She will continue taking antiemetics as needed, proton pump inhibitor and pain medicine as needed.

## 2014-04-23 NOTE — Progress Notes (Signed)
[facility] OFFICE PROGRESS NOTE  Patient Care Team: Madelyn Brunner, MD as PCP - General (Unknown Physician Specialty) Heath Lark, MD as Consulting Physician (Hematology and Oncology) Rollene Rotunda, MD as Consulting Physician (Hematology and Oncology)  SUMMARY OF ONCOLOGIC HISTORY:  She was found to have anemia and was placed on observation. The cause of the anemia was thought to be related to chronic kidney disease. The patient also has history of Roux-en-Y gastric bypass and it was thought to be the contributing factor. 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 On 04/09/2013, she was started on prednisone 60 mg daily and methotrexate 7.5 mg once a week by mouth On 04/16/2013, she developed acute renal failure with hyperkalemia. Prednisone dose was reduced to 40 mg a day and methotrexate was placed on hold On 04/23/2013, acute renal failure was improving. The prednisone dose was reduced to 20 mg a day and would resume methotrexate at 7.5 mg once a week. On 04/27/2013, I reduced methotrexate to 5 mg once a week and prednisone dose was reduced to 10 mg alternate with 20 mg On 05/04/2013, I order one unit of blood transfusion. I The prednisone dose the same but reduced methotrexate to 2.5 mg once a week On 05/11/2013, I started to reduce prednisone dose further to 10 mg daily but to keep methotrexate at 2.5 mg once a week On 05/25/2013, I further reduce prednisone to 10 mg alternate with 7.5 mg and keep methotrexate at 2.5 mg once a week On 06/09/2013, and I further reduce prednisone to 5 mg daily and keep methotrexate at 2.5 mg once a week On 08/14/2013, I increase the methotrexate to 5 mg once a week and Prednisone at 5 mg daily. On 09/10/2013, I reduced prednisone to 5 mg alternate with 2.5 mg and keep methotrexate at 5 mg once a week. On 10/10/2013, I increased the methotrexate to 10 mg once a week  and The prednisone at 5 mg alternate with 2.5 mg. On 10/23/2013, she saw a hematologist at [facility] for second opinion. Further recommendations are pending. On 12/18/13: prednisone is tapered to 2.5 mg daily. Methotrexate at 10 mg per week. On 01/21/14: Prednisone was discontinued. She was taper off methotrexate. On 02/10/2014, methotrexate was reinitiated at 10 mg per week along with 10 mg prednisone. On 02/19/2014, methotrexate was placed on hold due to elevated liver function tests. On 02/21/2014, ultrasound abdomen showed cholecystitis. From 02/22/2014 to 02/28/2014, the patient was hospitalized for cholecystitis, gastric mass and underwent CT-guided biopsy which showed T cell lymphoma.  INTERVAL HISTORY: Please see below for problem oriented charting. Since I last saw her, she has progressive decline in performance status. She participate briefly with home PT. She has home healthcare nurse that helps with wound dressing for decubitus ulcer and wrapping her legs. She has very poor oral intake and have chronic nausea and vomiting. Anti-emetics appears to help. She take pain medicine as needed for epigastric pain.  REVIEW OF SYSTEMS:   Constitutional: Denies fevers, chills or abnormal weight loss Eyes: Denies blurriness of vision Ears, nose, mouth, throat, and face: Denies mucositis or sore throat Respiratory: Denies cough, dyspnea or wheezes Cardiovascular: Denies palpitation, chest discomfort Lymphatics: Denies new lymphadenopathy or easy bruising Neurological:Denies numbness, tingling or new weaknesses Behavioral/Psych: Mood is stable, no new changes  All other systems were reviewed with the patient and are negative.  I have reviewed the past medical history, past surgical history,  social history and family history with the patient and they are unchanged from previous note.  ALLERGIES:  is allergic to latex; penicillins; and sulfonamide derivatives.  MEDICATIONS:  Current  Outpatient Prescriptions  Medication Sig Dispense Refill  . acetaminophen-codeine (TYLENOL #3) 300-30 MG per tablet Take 1-2 tablets by mouth 4 (four) times daily as needed (headache).     Marland Kitchen allopurinol (ZYLOPRIM) 300 MG tablet Take 300 mg by mouth every morning.     . butalbital-acetaminophen-caffeine (FIORICET, ESGIC) 50-325-40 MG per tablet Take 1-2 tablets by mouth every 6 (six) hours as needed for headache (headache).    . Calcium Carbonate-Vitamin D (CALCIUM 600+D) 600-400 MG-UNIT per tablet Take 1 tablet by mouth daily.    . cholecalciferol (VITAMIN D) 1000 UNITS tablet Take 1,000 Units by mouth 2 (two) times daily.    . citalopram (CELEXA) 20 MG tablet Take 20 mg by mouth every morning.     . Cyanocobalamin (VITAMIN B-12 SL) Place 1 tablet under the tongue daily.    . fluticasone (FLONASE) 50 MCG/ACT nasal spray Place 3 sprays into the nose daily.    . folic acid (FOLVITE) 1 MG tablet Take 1 tablet (1 mg total) by mouth daily. 90 tablet 3  . levothyroxine (SYNTHROID, LEVOTHROID) 75 MCG tablet Take 75 mcg by mouth daily before breakfast.     . Multiple Vitamin (MULTIVITAMIN WITH MINERALS) TABS Take 1 tablet by mouth daily. Centrum    . ondansetron (ZOFRAN) 8 MG tablet Take 8 mg by mouth every 8 (eight) hours as needed for nausea or vomiting (nausea).    Marland Kitchen oxybutynin (DITROPAN) 5 MG tablet Take 5 mg by mouth Daily.    Marland Kitchen oxyCODONE (OXY IR/ROXICODONE) 5 MG immediate release tablet Take 1 tablet (5 mg total) by mouth every 4 (four) hours as needed for severe pain. 60 tablet 0  . pantoprazole (PROTONIX) 40 MG tablet Take 1 tablet (40 mg total) by mouth 2 (two) times daily. 60 tablet 2  . potassium chloride SA (K-DUR,KLOR-CON) 20 MEQ tablet Take 1 tablet (20 mEq total) by mouth daily. 30 tablet 0  . predniSONE (DELTASONE) 5 MG tablet Take 1 tablet (5 mg total) by mouth 2 (two) times daily with a meal. 90 tablet 3   No current facility-administered medications for this visit.    PHYSICAL  EXAMINATION: ECOG PERFORMANCE STATUS: 3 - Symptomatic, >50% confined to bed  Filed Vitals:   04/23/14 1130  BP: 98/60  Pulse: 95  Temp: 98.2 F (36.8 C)  Resp: 18   Filed Weights   04/23/14 1130  Weight: 206 lb 12.8 oz (93.804 kg)    GENERAL:alert, no distress and comfortable. She is morbidly obese SKIN: skin color, texture, turgor are normal, no rashes or significant lesions EYES: normal, Conjunctiva are jaundiced and non-injected, sclera clear Musculoskeletal:no cyanosis of digits and no clubbing  NEURO: alert & oriented x 3 with fluent speech, no focal motor/sensory deficits  LABORATORY DATA:  I have reviewed the data as listed    Component Value Date/Time   NA 137 04/23/2014 1038   NA 142 02/28/2014 0430   K 4.6 04/23/2014 1038   K 3.3* 02/28/2014 0430   CL 105 02/28/2014 0430   CL 105 10/25/2012 1135   CO2 25 04/23/2014 1038   CO2 27 02/28/2014 0430   GLUCOSE 102 04/23/2014 1038   GLUCOSE 86 02/28/2014 0430   GLUCOSE 85 10/25/2012 1135   BUN 22.7 04/23/2014 1038   BUN 18 02/28/2014 0430   CREATININE  1.0 04/23/2014 1038   CREATININE 0.98 02/28/2014 0430   CREATININE 1.22* 09/08/2011 1120   CALCIUM 9.0 04/23/2014 1038   CALCIUM 8.7 02/28/2014 0430   PROT 4.8* 04/23/2014 1038   PROT 5.0* 02/27/2014 0544   ALBUMIN 2.7* 04/23/2014 1038   ALBUMIN 2.9* 02/27/2014 0544   AST 49* 04/23/2014 1038   AST 31 02/27/2014 0544   ALT 38 04/23/2014 1038   ALT 22 02/27/2014 0544   ALKPHOS 114 04/23/2014 1038   ALKPHOS 92 02/27/2014 0544   BILITOT 3.04* 04/23/2014 1038   BILITOT 2.2* 02/27/2014 0544   GFRNONAA 57* 02/28/2014 0430   GFRAA 66* 02/28/2014 0430    No results found for: SPEP, UPEP  Lab Results  Component Value Date   WBC 3.2* 04/23/2014   NEUTROABS 0.8* 04/23/2014   HGB 8.8* 04/23/2014   HCT 27.4* 04/23/2014   MCV 92.6 04/23/2014   PLT 40* 04/23/2014      Chemistry      Component Value Date/Time   NA 137 04/23/2014 1038   NA 142 02/28/2014 0430    K 4.6 04/23/2014 1038   K 3.3* 02/28/2014 0430   CL 105 02/28/2014 0430   CL 105 10/25/2012 1135   CO2 25 04/23/2014 1038   CO2 27 02/28/2014 0430   BUN 22.7 04/23/2014 1038   BUN 18 02/28/2014 0430   CREATININE 1.0 04/23/2014 1038   CREATININE 0.98 02/28/2014 0430   CREATININE 1.22* 09/08/2011 1120      Component Value Date/Time   CALCIUM 9.0 04/23/2014 1038   CALCIUM 8.7 02/28/2014 0430   ALKPHOS 114 04/23/2014 1038   ALKPHOS 92 02/27/2014 0544   AST 49* 04/23/2014 1038   AST 31 02/27/2014 0544   ALT 38 04/23/2014 1038   ALT 22 02/27/2014 0544   BILITOT 3.04* 04/23/2014 1038   BILITOT 2.2* 02/27/2014 0544     ASSESSMENT & PLAN:  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 She is becoming more pancytopenic. I recommend hospice care and she agreed to proceed.  Abnormal liver function tests Her liver function tests is worse and clinically she is jaundiced. She has poor appetite. It could be due to persistent liver disease, persistent cholecystitis versus metastatic cancer to the liver. I recommend observation for now. She is not a candidate for cholecystectomy.  Anemia in neoplastic disease The cause of the anemia is due to bone marrow infiltration and component of anemia of chronic disease. I will observe for now.  Edema The cause is unclear.  Echocardiogram showed no evidence of congestive heart failure. I suspect this is due to poor venous circulation and possibly diastolic failure. In the meantime, she will continue on furosemide daily.    Hypotension I recommend holding off metoprolol as the patient is hypotensive and symptomatic. I suspect this is due to poor oral intake and dehydration.   Stomach ulcer She is having chronic nausea daily especially after meals. She will continue taking antiemetics as needed, proton pump inhibitor and pain medicine as  needed.   No orders of the defined types were placed in this encounter.   All questions were answered. The patient knows to call the clinic with any problems, questions or concerns. No barriers to learning was detected. I spent 25 minutes counseling the patient face to face. The total time spent in the appointment was 30 minutes and more than 50% was on counseling and review of  test results     [facility], Passion Lavin, MD 04/23/2014 9:27 PM

## 2014-04-23 NOTE — Telephone Encounter (Signed)
[facility] referral called to [facility].  They will plan visit for tomorrow morning. They will call husband this afternoon to confirm.

## 2014-04-23 NOTE — Assessment & Plan Note (Signed)
The cause is unclear.  Echocardiogram showed no evidence of congestive heart failure. I suspect this is due to poor venous circulation and possibly diastolic failure. In the meantime, she will continue on furosemide daily.

## 2014-04-23 NOTE — Assessment & Plan Note (Signed)
Her performance status has declined significantly and she is not making any improvement. She is becoming more pancytopenic. I recommend hospice care and she agreed to proceed.

## 2014-04-23 NOTE — Assessment & Plan Note (Signed)
The cause of the anemia is due to bone marrow infiltration and component of anemia of chronic disease. I will observe for now.

## 2014-04-23 NOTE — Assessment & Plan Note (Signed)
I recommend holding off metoprolol as the patient is hypotensive and symptomatic. I suspect this is due to poor oral intake and dehydration.

## 2014-05-01 ENCOUNTER — Telehealth: Payer: Self-pay | Admitting: *Deleted

## 2014-05-01 NOTE — Telephone Encounter (Signed)
Call from [facility] RN requesting verbal order from Dr. Alvy Bimler for Hospice MD to sign DNR form.  Verbal order given.

## 2014-05-24 DEATH — deceased

## 2014-06-12 IMAGING — CT CT CERVICAL SPINE W/O CM
4 series · 15 of 33 positions shown, 18 images · non-contrast
Comparison: 07/11/2012.  05/22/2012.

***ADDENDUM*** CREATED: 07/27/2012 [DATE]

Additionally, the ordering physician requests cervical spine CT
images with flexion and extension positioning.  This additional
imaging was performed on the afternoon of 07/26/2012 and is
reported here:
Flexion:  Slightly less dorsal angulation of the type 2 odontoid
fracture fragment.  Stable and normal atlantodens interval.  Stable
C1 fracture configuration and stable atlanto-occipital alignment.
Stable trace anterolisthesis of C3 on C4.
Extension:  Mildly increased dorsal angulation of the odontoid
fragment compared to neutral positioning, a change of approximately
25 degrees compared to the flexion positioning.  Stable and normal
atlantodens interval.  Stable C1 fracture configuration.  Stable
atlanto-occipital alignment.  Brain windows suggest effacement of
CSF at the C1-C2 thecal sac compared to the neutral and flexion
positioning.  Stable trace anterolisthesis of C3 on C4.
CLINICAL DATA: Followup C2 fracture
CT CERVICAL SPINE WITHOUT CONTRAST
TECHNIQUE: Multidetector CT imaging of the cervical spine was
performed. Multiplanar CT image reconstructions were also
generated.

[Series 2: c spine bone/ext · axial · 0.31mm/px · z∈[+44,+141]mm · 5 of 59 slices shown, 7 images]
[im 10/59  soft-tissue]
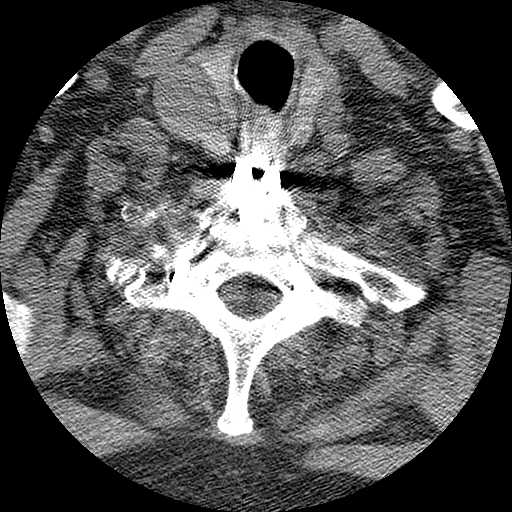
[im 10/59  bone]
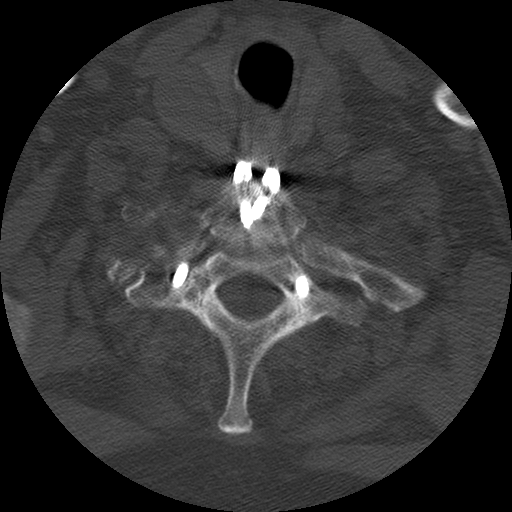
[im 20/59  bone]
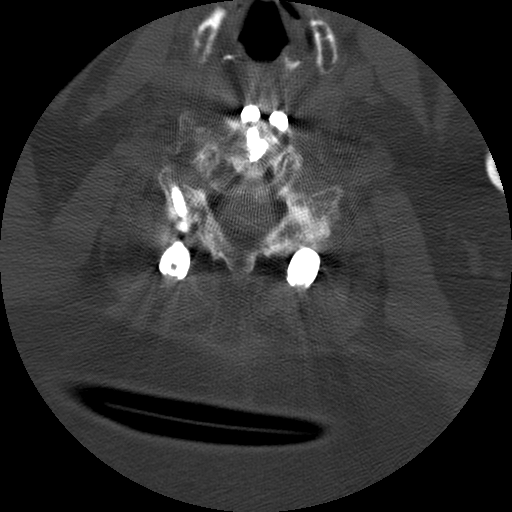
[im 30/59  bone]
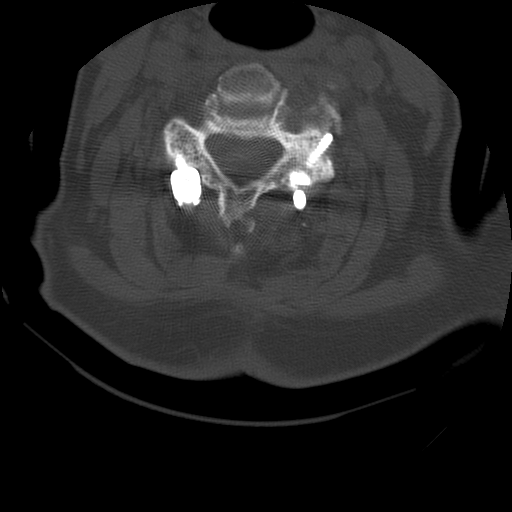
[im 39/59  bone]
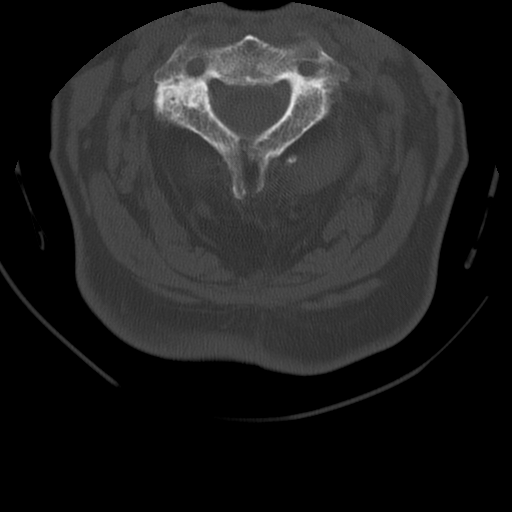
[im 49/59  soft-tissue]
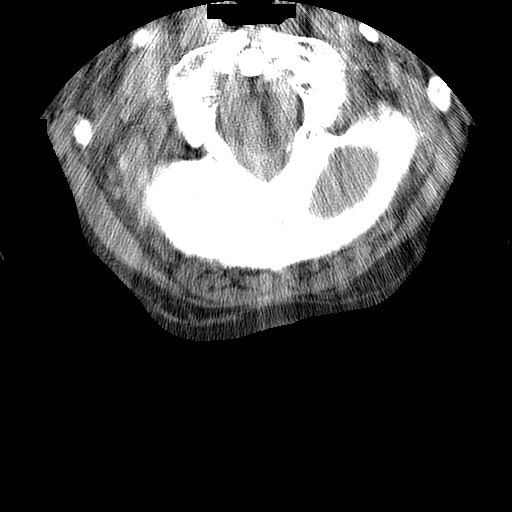
[im 49/59  bone]
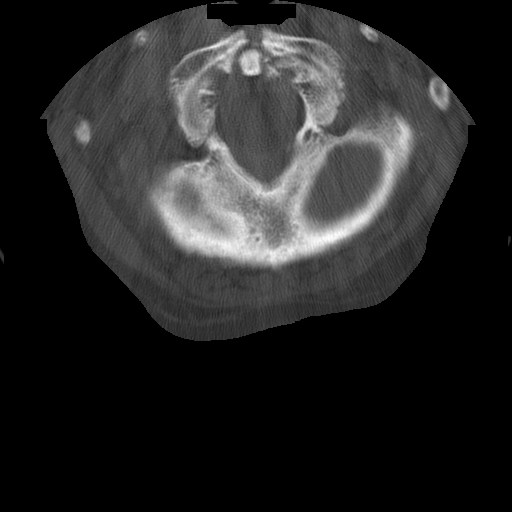

[Series 3: c spine soft/ext · axial · 0.31mm/px · z∈[+44,+69]mm · 2 of 59 slices shown]
[im 10/59  soft-tissue]
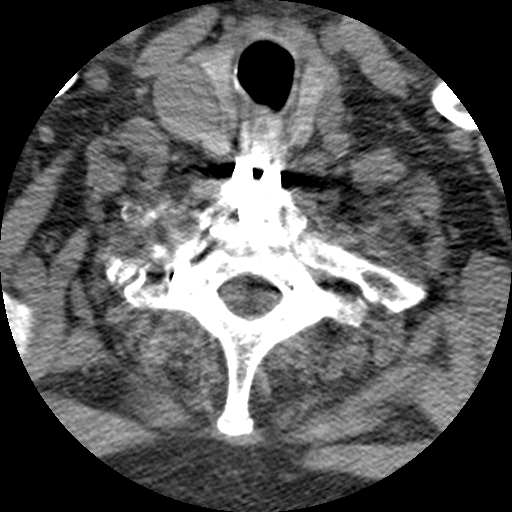
[im 20/59  soft-tissue]
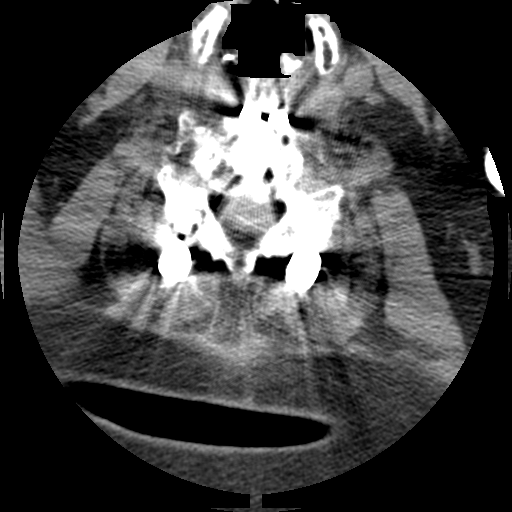

[Series 400: cor /ext · coronal · 0.31mm/px · 3 of 40 slices shown]
[im 8/40  bone]
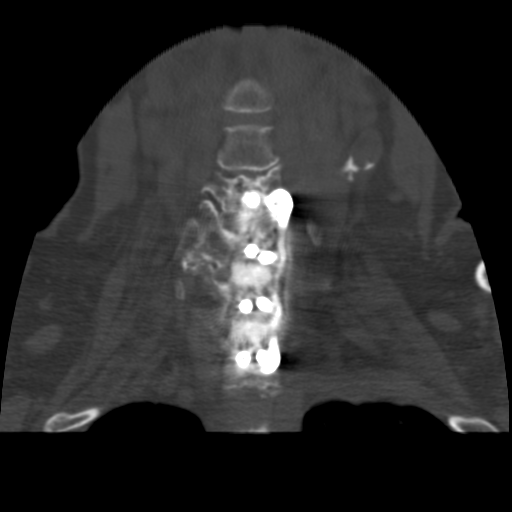
[im 16/40  bone]
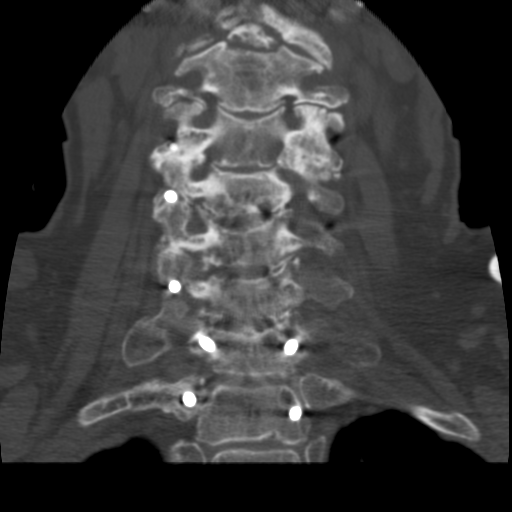
[im 24/40  bone]
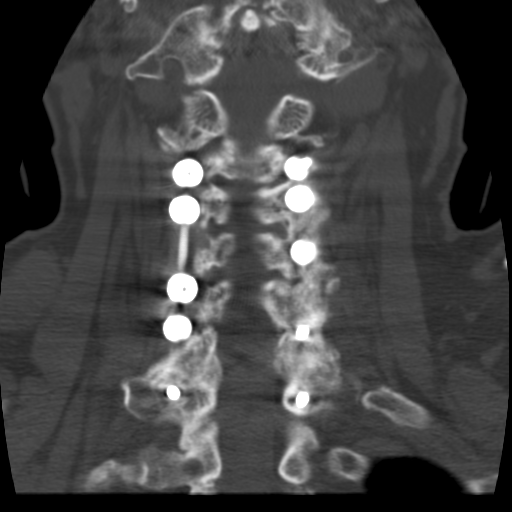

[Series 401: sag /ext · sagittal · 0.31mm/px · 5 of 42 slices shown, 6 images]
[im 14/42  bone]
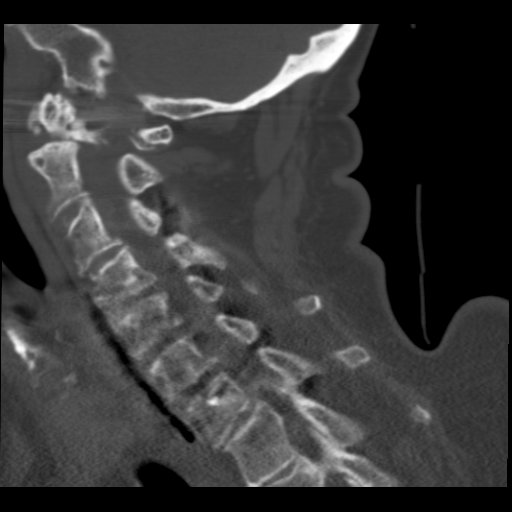
[im 18/42  bone]
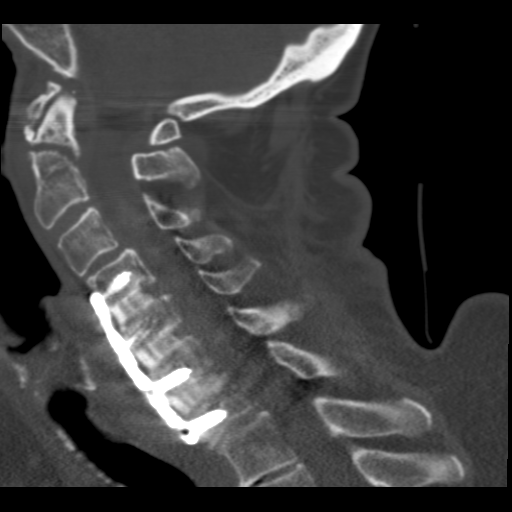
[im 21/42  soft-tissue]
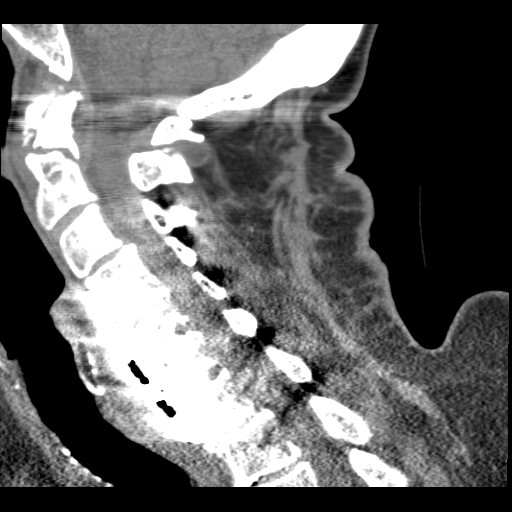
[im 21/42  bone]
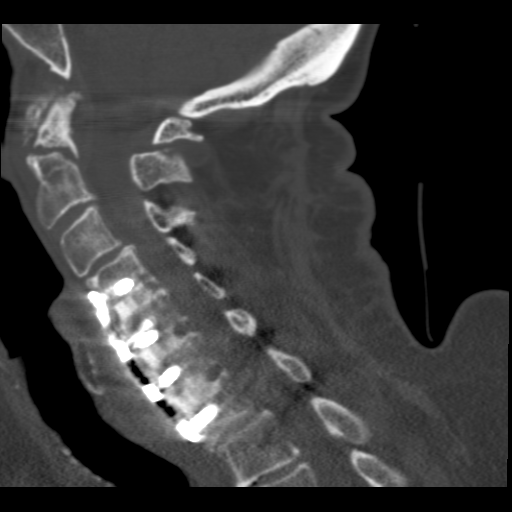
[im 24/42  bone]
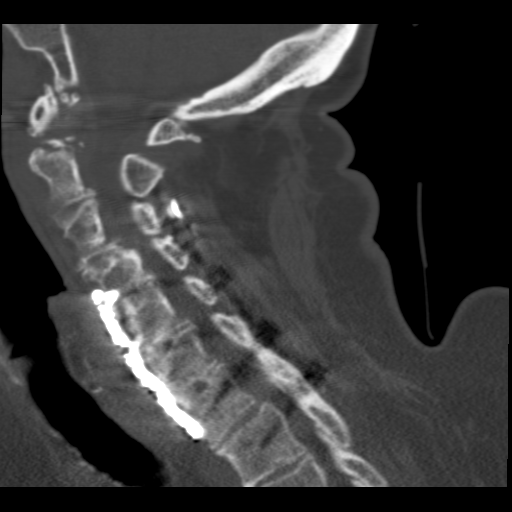
[im 28/42  bone]
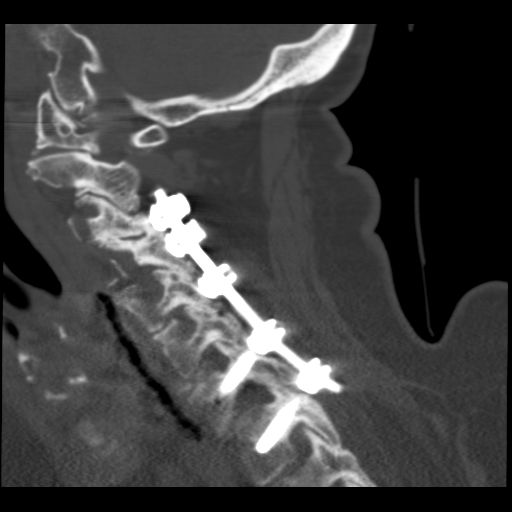

[15 of 33 positions shown; findings below may reference images not displayed]

CONCLUSION: Motion of the odontoid fracture fragment with respect
to the C2 body in flexion and extension positioning; angulation
difference of up to 25 degrees comparing flexion to extension.  C1
fracture fragments and other upper cervical alignment appears
stable.  There is evidence of CSF effacement from the thecal sac at
C1-C2 with extension positioning.
FINDINGS: Fracture lines within the C1 vertebra are becoming less
distinct indicating healing.  No change in position or alignment of
the C1 fragments.  Type 2 fracture at the base of the dens
continues to show loosened separation of 2 mm.  This fracture could
develop chronic nonunion.  There is 1 mm of dorsal displacement of
the Brucil compared to the previous exam.

No other change.  Facet arthropathy at C2-3 is the same.  Anterior
discectomy and fusion from C4 to C7 and posterior fusion from C3-T1
appears the same.
IMPRESSION: C1 fracture lines are becoming less distinct, indicating healing.

Continued lucency at the C2 fracture.  Nonunion could develop.  1
mm or less dorsal displacement of the dens relative to the body of
C2 since the previous study.
